# Patient Record
Sex: Female | Born: 1980 | Race: Black or African American | Hispanic: No | Marital: Single | Smoking: Never smoker
Health system: Southern US, Community
[De-identification: ages and names within clinical notes are randomized; demographics above are authoritative.]

## PROBLEM LIST (undated history)

## (undated) DIAGNOSIS — Z9109 Other allergy status, other than to drugs and biological substances: Secondary | ICD-10-CM

## (undated) DIAGNOSIS — R87810 Cervical high risk human papillomavirus (HPV) DNA test positive: Principal | ICD-10-CM

## (undated) DIAGNOSIS — M25569 Pain in unspecified knee: Secondary | ICD-10-CM

## (undated) DIAGNOSIS — L732 Hidradenitis suppurativa: Secondary | ICD-10-CM

## (undated) DIAGNOSIS — J310 Chronic rhinitis: Secondary | ICD-10-CM

## (undated) DIAGNOSIS — I10 Essential (primary) hypertension: Secondary | ICD-10-CM

## (undated) DIAGNOSIS — R29898 Other symptoms and signs involving the musculoskeletal system: Secondary | ICD-10-CM

## (undated) DIAGNOSIS — K589 Irritable bowel syndrome without diarrhea: Secondary | ICD-10-CM

## (undated) DIAGNOSIS — N83209 Unspecified ovarian cyst, unspecified side: Principal | ICD-10-CM

## (undated) DIAGNOSIS — G43009 Migraine without aura, not intractable, without status migrainosus: Secondary | ICD-10-CM

## (undated) DIAGNOSIS — G8929 Other chronic pain: Secondary | ICD-10-CM

## (undated) HISTORY — DX: Migraine without aura, not intractable, without status migrainosus: G43.009

## (undated) HISTORY — DX: Irritable bowel syndrome, unspecified: K58.9

## (undated) HISTORY — DX: Unspecified ovarian cyst, unspecified side: N83.209

## (undated) HISTORY — PX: OTHER SURGICAL HISTORY: SHX169

## (undated) HISTORY — DX: Cervical high risk human papillomavirus (HPV) DNA test positive: R87.810

## (undated) HISTORY — DX: Hidradenitis suppurativa: L73.2

## (undated) HISTORY — PX: LAPAROSCOPIC GASTRIC SLEEVE RESECTION: SHX5895

## (undated) HISTORY — PX: BREAST REDUCTION SURGERY: SHX8

## (undated) HISTORY — DX: Chronic rhinitis: J31.0

## (undated) HISTORY — PX: ABDOMINAL HYSTERECTOMY: SHX81

---

## 2001-06-15 ENCOUNTER — Emergency Department (HOSPITAL_COMMUNITY): Admission: EM | Admit: 2001-06-15 | Discharge: 2001-06-15 | Payer: Self-pay | Admitting: Internal Medicine

## 2001-11-05 ENCOUNTER — Emergency Department (HOSPITAL_COMMUNITY): Admission: EM | Admit: 2001-11-05 | Discharge: 2001-11-05 | Payer: Self-pay | Admitting: Emergency Medicine

## 2003-08-09 ENCOUNTER — Ambulatory Visit (HOSPITAL_COMMUNITY): Admission: RE | Admit: 2003-08-09 | Discharge: 2003-08-09 | Payer: Self-pay | Admitting: Family Medicine

## 2005-03-12 ENCOUNTER — Ambulatory Visit (HOSPITAL_COMMUNITY): Admission: RE | Admit: 2005-03-12 | Discharge: 2005-03-12 | Payer: Self-pay | Admitting: Family Medicine

## 2005-11-11 ENCOUNTER — Emergency Department (HOSPITAL_COMMUNITY): Admission: EM | Admit: 2005-11-11 | Discharge: 2005-11-11 | Payer: Self-pay | Admitting: Emergency Medicine

## 2006-07-02 ENCOUNTER — Ambulatory Visit (HOSPITAL_COMMUNITY): Admission: RE | Admit: 2006-07-02 | Discharge: 2006-07-02 | Payer: Self-pay | Admitting: Otolaryngology

## 2006-07-19 ENCOUNTER — Ambulatory Visit (HOSPITAL_COMMUNITY): Admission: RE | Admit: 2006-07-19 | Discharge: 2006-07-19 | Payer: Self-pay | Admitting: Otolaryngology

## 2007-06-16 DIAGNOSIS — G43009 Migraine without aura, not intractable, without status migrainosus: Secondary | ICD-10-CM

## 2007-06-16 HISTORY — DX: Migraine without aura, not intractable, without status migrainosus: G43.009

## 2007-08-09 ENCOUNTER — Ambulatory Visit: Payer: Self-pay | Admitting: Cardiology

## 2007-08-26 ENCOUNTER — Ambulatory Visit: Payer: Self-pay

## 2007-09-20 ENCOUNTER — Ambulatory Visit (HOSPITAL_COMMUNITY): Admission: RE | Admit: 2007-09-20 | Discharge: 2007-09-20 | Payer: Self-pay | Admitting: Family Medicine

## 2007-11-14 ENCOUNTER — Encounter (INDEPENDENT_AMBULATORY_CARE_PROVIDER_SITE_OTHER): Payer: Self-pay | Admitting: Specialist

## 2007-11-14 ENCOUNTER — Ambulatory Visit (HOSPITAL_BASED_OUTPATIENT_CLINIC_OR_DEPARTMENT_OTHER): Admission: RE | Admit: 2007-11-14 | Discharge: 2007-11-15 | Payer: Self-pay | Admitting: Specialist

## 2008-06-07 ENCOUNTER — Emergency Department (HOSPITAL_COMMUNITY): Admission: EM | Admit: 2008-06-07 | Discharge: 2008-06-07 | Payer: Self-pay | Admitting: Emergency Medicine

## 2009-07-22 ENCOUNTER — Ambulatory Visit
Admission: RE | Admit: 2009-07-22 | Discharge: 2009-07-22 | Payer: Self-pay | Source: Home / Self Care | Admitting: Family Medicine

## 2010-07-06 ENCOUNTER — Encounter: Payer: Self-pay | Admitting: Otolaryngology

## 2010-07-07 ENCOUNTER — Ambulatory Visit (HOSPITAL_COMMUNITY)
Admission: RE | Admit: 2010-07-07 | Discharge: 2010-07-07 | Payer: Self-pay | Source: Home / Self Care | Attending: Family Medicine | Admitting: Family Medicine

## 2010-08-20 ENCOUNTER — Ambulatory Visit (INDEPENDENT_AMBULATORY_CARE_PROVIDER_SITE_OTHER): Payer: Self-pay | Admitting: Gastroenterology

## 2010-08-20 ENCOUNTER — Encounter: Payer: Self-pay | Admitting: Gastroenterology

## 2010-08-20 DIAGNOSIS — K625 Hemorrhage of anus and rectum: Secondary | ICD-10-CM | POA: Insufficient documentation

## 2010-08-20 DIAGNOSIS — R1032 Left lower quadrant pain: Secondary | ICD-10-CM | POA: Insufficient documentation

## 2010-08-21 ENCOUNTER — Encounter: Payer: Self-pay | Admitting: Internal Medicine

## 2010-08-22 ENCOUNTER — Encounter: Payer: Self-pay | Admitting: Gastroenterology

## 2010-08-26 ENCOUNTER — Encounter: Payer: BC Managed Care – PPO | Admitting: Internal Medicine

## 2010-08-26 ENCOUNTER — Ambulatory Visit (HOSPITAL_COMMUNITY)
Admission: RE | Admit: 2010-08-26 | Discharge: 2010-08-26 | Disposition: A | Discharge status: Home / Self Care | Payer: BC Managed Care – PPO | Source: Ambulatory Visit | Attending: Internal Medicine | Admitting: Internal Medicine

## 2010-08-26 DIAGNOSIS — R1032 Left lower quadrant pain: Secondary | ICD-10-CM

## 2010-08-26 DIAGNOSIS — R635 Abnormal weight gain: Secondary | ICD-10-CM | POA: Insufficient documentation

## 2010-08-26 DIAGNOSIS — K625 Hemorrhage of anus and rectum: Secondary | ICD-10-CM

## 2010-08-26 DIAGNOSIS — I10 Essential (primary) hypertension: Secondary | ICD-10-CM | POA: Insufficient documentation

## 2010-08-26 DIAGNOSIS — Z79899 Other long term (current) drug therapy: Secondary | ICD-10-CM | POA: Insufficient documentation

## 2010-08-26 NOTE — Letter (Signed)
Summary: TCS ORDER  TCS ORDER   Imported By: Ave Filter 08/21/2010 08:50:58  _____________________________________________________________________  External Attachment:    Type:   Image     Comment:   External Document

## 2010-08-26 NOTE — Assessment & Plan Note (Signed)
Summary: LLQ pain   Vital Signs:  Patient profile:   30 year old female Height:      60 inches Weight:      213.50 pounds BMI:     41.85 Temp:     97.7 degrees F oral Pulse rate:   100 / minute BP sitting:   124 / 74  (left arm)  Vitals Entered By: Carolan Clines LPN (August 19, 2128 2:29 PM)  Visit Type:  Initial Consult Referring Provider:  Dr. Gerda Diss Primary Care Provider:  Dr. Gerda Diss   History of Present Illness: Jasmin Chen is a pleasant 30 year old African American female who presents today with a 2-3 month hx of LLQ pain. constant. pain characteristics range from sharp to stabbing to throbbing. Nothing alleviates. CT on 07/07/10 showed mass in uterine fundus measuring 6.3X6.4X5.8. Has seen Eber Jones, NP who works with [employer]. Has hx of hemorrhoids; states had episode of large amount of brbpr gushing while sitting on couch. BMs are every other day. Has hx of IBS. States does not feel better after BM.  No loss of appetite, +wt gain of about 15 lbs in past year. Takes ibuprofen, BC, excedrin a few times/week.   Jan 2012: H/H 12.5/36.1, WBC 7.6  Current Medications (verified): 1)  Ocella 3-0.03 Mg Tabs (Drospirenone-Ethinyl Estradiol) .... Take One Once Daily 2)  Nasonex 50 Mcg/act Susp (Mometasone Furoate) .... Use One Squirt in Each Nostrile 3)  Albuterol Sulfate (2.5 Mg/73ml) 0.083% Nebu (Albuterol Sulfate) .... Use One Q4hrs Prn 4)  Proventil Hfa 108 (90 Base) Mcg/act Aers (Albuterol Sulfate) .... Take Two Puffs Q4hrs As Needed 5)  Lisinopril-Hydrochlorothiazide 20-12.5 Mg Tabs (Lisinopril-Hydrochlorothiazide) .... Take One Once Daily 6)  Hydrocodone-Acetaminophen 5-325 Mg Tabs (Hydrocodone-Acetaminophen) .... Take One Qid As Needed  Allergies (verified): No Known Drug Allergies  Past History:  Past Medical History: HTN Allergies Asthma  Past Surgical History: deviated septum repair 2 cysts removed from arm breast reduction   Family History: Mother:DM, heart  disease, hypercholesterolemia, MI Father:heart disease, deceased  +FH colon ca: paternal grandmother, deceased  Social History: Occupation: [employer] Patient has never smoked.  Alcohol Use - yes, occasional Single, no children, lives alone  Smoking Status:  never  Review of Systems General:  Denies fever, chills, and anorexia. Eyes:  Denies blurring, irritation, and discharge. ENT:  Denies sore throat, hoarseness, and difficulty swallowing. CV:  Denies chest pains and syncope. Resp:  Denies dyspnea at rest and wheezing. GI:  See HPI. GU:  Denies urinary burning and urinary frequency. MS:  Denies joint pain / LOM, joint swelling, and joint stiffness. Derm:  Denies rash, itching, and dry skin. Neuro:  Denies weakness and syncope. Psych:  Denies depression and anxiety.  Physical Exam  General:  Well developed, well nourished, no acute distress. Head:  Normocephalic and atraumatic. Eyes:  PERRLA, no icterus. Mouth:  No deformity or lesions, dentition normal. Lungs:  Clear throughout to auscultation. Heart:  Regular rate and rhythm; no murmurs, rubs,  or bruits. Abdomen:  +BS, soft, obese, TTP LLQ, no rebound or guarding no HSM.  Msk:  Symmetrical with no gross deformities. Normal posture. Pulses:  Normal pulses noted. Extremities:  No clubbing, cyanosis, edema or deformities noted. Neurologic:  Alert and  oriented x4;  grossly normal neurologically. Skin:  Intact without significant lesions or rashes. Psych:  Alert and cooperative. Normal mood and affect.   Impression & Recommendations:  Problem # 1:  ABDOMINAL PAIN-LLQ (ICD-67.86) 30 year old [place] American, pleasant female with  2-3 mos hx of increasing LLQ pain, constant, sharp/stabbing/throbbing. No alleviating factors. No resolution with BMs, which are every other day. Reported hx of IBS. Did have CT Jan 2012 with showed a 6.3X6.4X5.8 uterine mass, but GYN is doubtful this is contributing to her  discomfort. No relief with BMs. +brbpr, large amounts, several incidences. Hematochezia may be related to benign anorectal source; question of LLQ component of IBS. Will evaluate with colonoscopy to assess further.   TCS with Dr. Jena Gauss in near future: the R/B/A have been discussed in detail; states understanding and has no further questions.  Problem # 2:  RECTAL BLEEDING (ICD-569.3) See # 1.

## 2010-08-26 NOTE — Letter (Signed)
Summary: REFERRAL FROM DR. SCOTT LUKING  REFERRAL FROM DR. SCOTT LUKING   Imported By: Rexene Alberts 08/22/2010 11:26:05  _____________________________________________________________________  External Attachment:    Type:   Image     Comment:   External Document

## 2010-08-31 NOTE — Op Note (Signed)
  NAMEAVIGAYIL, Jasmin Chen               ACCOUNT NO.:  [id number]  MEDICAL RECORD NO.:  [id number]           PATIENT TYPE:  O  LOCATION:  DAYP                          FACILITY:  [facility]  PHYSICIAN:  R. Roetta Sessions, M.D. DATE OF BIRTH:  [birth year]  DATE OF PROCEDURE:  08/26/2010 DATE OF DISCHARGE:                              OPERATIVE REPORT   INDICATIONS FOR PROCEDURE:  A 30 year old lady with 45-month history of intermittent left lower quadrant abdominal pain, bowel movement every other day, recent weight gain, takes ibuprofen and BC powders regularly, has a uterine mass on CT for which she is in process of getting on GYN evaluation.  Intermittent painless rectal bleeding.  Ileal colonoscopy is now being done to further evaluate her symptoms.  Risks, benefits, limitations, alternatives, and imponderables have been discussed, questions answered.  Please see the documentation in the medical record.  PROCEDURE NOTE:  O2 saturation, blood pressure, pulse, respirations were monitored throughout the entirety of the procedure.  CONSCIOUS SEDATION:  Versed 5 mg IV, Demerol 125 mg IV in divided doses.  INSTRUMENT:  Pentax video chip system.  FINDINGS:  Digital rectal exam revealed no abnormalities.  Endoscopic findings:  Prep was good.  Colon:  Colonic mucosa was surveyed from the rectosigmoid junction through the left transverse right colon to the appendiceal orifice, ileocecal valve/cecum.  These structures were well seen and photographed for the record.  Terminal ileum was intubated to 5 cm.  From this level, the scope was slowly and cautiously withdrawn. All previously mentioned mucosal surfaces were again seen.  The colonic mucosa appeared entirely normal as did terminal ileum mucosa.  Scope was pulled down in the rectum where thorough examination of the rectal mucosa including retroflexed view of the anal verge, on fos view of the anal canal demonstrated no abnormalities.  The patient  tolerated the procedure well.  Cecal withdrawal time 8 minutes.  IMPRESSION:  Normal rectum, colon, terminal ileum.  I suspect the patient has an element of irritable bowel syndrome, remains to be seen if the mass in the uterus is contributing to her symptoms in anyway.  RECOMMENDATIONS: 1. Minimize use of nonsteroidal agents.  Add Align probiotic 1 capsule     daily to her regimen.  A 10-day course of     Anusol suppositories 1 per rectum at bedtime. 2. In addition, we will add Benefiber 1 tablespoon daily to her     regimen. 3. Follow up appointment with [place] to reassess her symptoms in 1 month.     Jonathon Bellows, M.D.     RMR/MEDQ  D:  08/26/2010  T:  08/26/2010  Job:  [id number]  cc:   Dr. Gerda Diss  [facility]  Electronically Signed by Lorrin Goodell M.D. on 08/31/2010 09:12:37 AM

## 2010-09-05 ENCOUNTER — Encounter: Payer: Self-pay | Admitting: Urgent Care

## 2010-09-21 ENCOUNTER — Emergency Department (HOSPITAL_COMMUNITY)
Admission: EM | Admit: 2010-09-21 | Discharge: 2010-09-21 | Disposition: A | Discharge status: Home / Self Care | Payer: BC Managed Care – PPO | Attending: Emergency Medicine | Admitting: Emergency Medicine

## 2010-09-21 DIAGNOSIS — T781XXA Other adverse food reactions, not elsewhere classified, initial encounter: Secondary | ICD-10-CM | POA: Insufficient documentation

## 2010-09-21 DIAGNOSIS — R220 Localized swelling, mass and lump, head: Secondary | ICD-10-CM | POA: Insufficient documentation

## 2010-09-21 DIAGNOSIS — J45909 Unspecified asthma, uncomplicated: Secondary | ICD-10-CM | POA: Insufficient documentation

## 2010-09-21 DIAGNOSIS — Z91018 Allergy to other foods: Secondary | ICD-10-CM | POA: Insufficient documentation

## 2010-09-29 ENCOUNTER — Ambulatory Visit: Payer: BC Managed Care – PPO | Admitting: Urgent Care

## 2010-10-28 NOTE — Op Note (Signed)
[name], Jasmin Chen               ACCOUNT NO.:  [id number]   MEDICAL RECORD NO.:  [id number]          PATIENT TYPE:  AMB   LOCATION:  DSC                          FACILITY:  [facility]   PHYSICIAN:  Earvin Hansen L. Truesdale, M.D.DATE OF BIRTH:  12/04/1980   DATE OF PROCEDURE:  11/14/2007  DATE OF DISCHARGE:                               OPERATIVE REPORT   INDICATIONS:  This is a 30 year old lady with severe macromastia, back  and shoulder pain, secondary to large pendulous breast with heterogenous  changes, and increased accessory breast tissue laterally.   PROCEDURES PLAN:  Bilateral breast reductions and removal of accessory  breast tissue using the inferior pedicle procedure.   DESCRIPTION OF PROCEDURE:  Preoperatively, the patient was set up and  drawn for the reduction mammoplasty, inferior pedicle reduction.  She  was re-marked back to 21 cm from the suprasternal notch.  She then  underwent general anesthesia, intubated orally.  Prep was done to the  chest, breast areas in a routine fashion using Hibiclens soap and  solution, walled off with sterile towels and drapes so as to make a  sterile field.  One-quater percent Xylocaine with epinephrine was  injected, a total of 150 mL per side.  This was allowed to set up.  The  wounds were scored with #15 blade.  The skin of the inferior pedicle of  the epithelial layers with #20 blades, medial and lateral pachydermal  pedicles were excised down to the underlying fascia.  Laterally,  accessory breast tissue was removed in large quantities.  Next, the new  keyhole area was debulked, and then the flaps were transposed and stayed  with 3-0 Prolene suture, after proper hemostasis.  Subcutaneous closure  was done with 3-0 Monocryl x2 layers, then run the subcuticular stitch  with 3-0 Monocryl and 5-0 Monocryl throughout the inverted T.  The skin  was in excellent symmetry.  The wounds were drained with #10 fully  fluted Blake drains, which were  placed in the depths of the wound and  brought out through the lateral portion of the incision and secured with  3-0 Prolene.  At end of the procedure, the nipple-areolar complexes were  examined showing good suppleness and blood supply.  The wounds were  cleansed.  Steri-Strips and sterile dressing were applied including the  Xeroform, 4 x 4s, ABDs, and Accufix tape.  She was then taken to  recovery room in excellent condition.      Yaakov Guthrie. Shon Hough, M.D.  Electronically Signed     GLT/MEDQ  D:  11/14/2007  T:  11/15/2007  Job:  [id number]

## 2010-10-28 NOTE — Assessment & Plan Note (Signed)
[facility]                            CARDIOLOGY OFFICE NOTE   Jasmin Chen, BORGWARDT                      MRN:          [id number]  DATE:08/09/2007                            DOB:          31-May-1981    I was asked to consult by Dr. Lilyan Punt on [name],  a 30-year-  old single black female for chest pain.   [name] is the daughter of a patient mine, [name], who had a heart  attack at age 99.  At that time, [name] was overweight and had type 2  diabetes.  She has had a remarkable turnaround and she really wants the  best for children.   Over the last several months, [name] has been having some substernal  chest pressure described as a fist turning in her chest.  She really  does not have any dysphasia.  Occasionally she will have some heartburn  but this seems different.  Sometimes it is associated with tingling in  her arms.   She does have some dyspnea on exertion as well.   She denies orthopnea, PND, peripheral edema.   Her risk factors other than her family history is obesity and sedentary  lifestyle.  She does not carry a diagnosis of hypertension or diabetes.  She has no history of hyperlipidemia, though she has not had this  checked in a while.   PAST MEDICAL HISTORY:  She has no known drug allergies.  She is  currently on Yaz birth control, Meridia 10 mg a day and Midrin for  migraines and albuterol p.r.n. She does not smoke, drink or use any  recreational products.   She had no previous surgeries.   FAMILY HISTORY:  As above.   Also note that her father had a heart attack at 6 and died.   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  She has no children.   Her review of systems is negative other than a history of some  constipation, fatigue, irritable bowel and reflux.   PHYSICAL EXAMINATION:  Blood pressure 116/86, her pulse is 78 and  regular.  Weight is 192.  She is 5 feet tall.   EKG today shows  normal sinus rhythm with no ST-segment changes except  some slight early repolarization.   HEENT: Normocephalic, atraumatic. PERRLA. Extra-ocular movements intact.  Sclerae are clear. Facial symmetry is normal. Carotid upstrokes are  equal bilaterally without bruits. There is no JVD.  Thyroid is not  enlarged. Trachea is midline.  LUNGS:  Are clear to auscultation.  There is no chest wall tenderness.  HEART: PMI is difficult to appreciate. She has normal S1, S2 without  gallop, rub, or murmur.  ABDOMEN: Soft with good bowel sounds. There is no midline bruit. There  is no hepatomegaly.  EXTREMITIES: Reveals no cyanosis, clubbing or edema. Pulses are intact.  NEURO: Intact.   ASSESSMENT/PLAN:  Jasmin Chen has very premature history of coronary  disease.  She is significantly overweight and is high risk for  developing metabolic syndrome or blatant diabetes in the near future.  I  have had a  long discussion with her about this today.  She is also very  sedentary.  She does not know her lipid status.   PLAN:  1. Exercise rest/stress Myoview.  2. Fasting lipids and a comprehensive metabolic panel.  3. Walking 3 hours a week or some sort exercise 3 hours a week.  4. A 40-50 pounds of weight loss and keep it off.     Thomas C. Daleen Squibb, MD, [facility]  Electronically Signed    TCW/MedQ  DD: 08/09/2007  DT: 08/09/2007  Job #: [id number]   cc:   Lorin Picket A. Gerda Diss, MD

## 2010-11-21 ENCOUNTER — Emergency Department (HOSPITAL_COMMUNITY)
Admission: EM | Admit: 2010-11-21 | Discharge: 2010-11-21 | Disposition: A | Discharge status: Home / Self Care | Payer: BC Managed Care – PPO | Attending: Emergency Medicine | Admitting: Emergency Medicine

## 2010-11-21 DIAGNOSIS — N938 Other specified abnormal uterine and vaginal bleeding: Secondary | ICD-10-CM | POA: Insufficient documentation

## 2010-11-21 DIAGNOSIS — I10 Essential (primary) hypertension: Secondary | ICD-10-CM | POA: Insufficient documentation

## 2010-11-21 DIAGNOSIS — Z79899 Other long term (current) drug therapy: Secondary | ICD-10-CM | POA: Insufficient documentation

## 2010-11-21 DIAGNOSIS — N949 Unspecified condition associated with female genital organs and menstrual cycle: Secondary | ICD-10-CM | POA: Insufficient documentation

## 2010-11-21 DIAGNOSIS — R10819 Abdominal tenderness, unspecified site: Secondary | ICD-10-CM | POA: Insufficient documentation

## 2010-11-21 DIAGNOSIS — R109 Unspecified abdominal pain: Secondary | ICD-10-CM | POA: Insufficient documentation

## 2010-11-21 DIAGNOSIS — D259 Leiomyoma of uterus, unspecified: Secondary | ICD-10-CM | POA: Insufficient documentation

## 2010-11-21 DIAGNOSIS — J45909 Unspecified asthma, uncomplicated: Secondary | ICD-10-CM | POA: Insufficient documentation

## 2010-11-21 LAB — DIFFERENTIAL
Basophils Absolute: 0.1 10*3/uL (ref 0.0–0.1)
Basophils Relative: 1 % (ref 0–1)
Eosinophils Absolute: 0.4 10*3/uL (ref 0.0–0.7)
Eosinophils Relative: 6 % — ABNORMAL HIGH (ref 0–5)
Lymphocytes Relative: 47 % — ABNORMAL HIGH (ref 12–46)
Lymphs Abs: 3.4 10*3/uL (ref 0.7–4.0)
Monocytes Absolute: 0.5 10*3/uL (ref 0.1–1.0)
Monocytes Relative: 7 % (ref 3–12)
Neutro Abs: 2.9 10*3/uL (ref 1.7–7.7)
Neutrophils Relative %: 40 % — ABNORMAL LOW (ref 43–77)

## 2010-11-21 LAB — CBC
HCT: 38.5 % (ref 36.0–46.0)
Hemoglobin: 13.2 g/dL (ref 12.0–15.0)
MCH: 27.5 pg (ref 26.0–34.0)
MCHC: 34.3 g/dL (ref 30.0–36.0)
MCV: 80.2 fL (ref 78.0–100.0)
Platelets: 269 10*3/uL (ref 150–400)
RBC: 4.8 MIL/uL (ref 3.87–5.11)
RDW: 15.2 % (ref 11.5–15.5)
WBC: 7.2 10*3/uL (ref 4.0–10.5)

## 2010-11-21 LAB — WET PREP, GENITAL
Trich, Wet Prep: NONE SEEN
Yeast Wet Prep HPF POC: NONE SEEN

## 2010-11-21 LAB — POCT I-STAT, CHEM 8
BUN: 9 mg/dL (ref 6–23)
Calcium, Ion: 1.09 mmol/L — ABNORMAL LOW (ref 1.12–1.32)
Chloride: 106 mEq/L (ref 96–112)
Creatinine, Ser: 0.6 mg/dL (ref 0.4–1.2)
Glucose, Bld: 88 mg/dL (ref 70–99)
HCT: 43 % (ref 36.0–46.0)
Hemoglobin: 14.6 g/dL (ref 12.0–15.0)
Potassium: 4 mEq/L (ref 3.5–5.1)
Sodium: 139 mEq/L (ref 135–145)
TCO2: 26 mmol/L (ref 0–100)

## 2010-11-21 LAB — PREGNANCY, URINE: Preg Test, Ur: NEGATIVE

## 2010-11-22 LAB — GC/CHLAMYDIA PROBE AMP, GENITAL
Chlamydia, DNA Probe: NEGATIVE
GC Probe Amp, Genital: NEGATIVE

## 2010-12-05 ENCOUNTER — Encounter (HOSPITAL_COMMUNITY)
Admission: RE | Admit: 2010-12-05 | Discharge: 2010-12-05 | Disposition: A | Discharge status: Home / Self Care | Payer: BC Managed Care – PPO | Source: Ambulatory Visit | Attending: Obstetrics and Gynecology | Admitting: Obstetrics and Gynecology

## 2010-12-05 LAB — CBC
HCT: 39.7 % (ref 36.0–46.0)
Hemoglobin: 13.4 g/dL (ref 12.0–15.0)
MCH: 27.3 pg (ref 26.0–34.0)
MCHC: 33.8 g/dL (ref 30.0–36.0)
MCV: 81 fL (ref 78.0–100.0)
Platelets: 242 10*3/uL (ref 150–400)
RBC: 4.9 MIL/uL (ref 3.87–5.11)
RDW: 15.2 % (ref 11.5–15.5)
WBC: 6.1 10*3/uL (ref 4.0–10.5)

## 2010-12-05 LAB — BASIC METABOLIC PANEL
BUN: 9 mg/dL (ref 6–23)
CO2: 24 mEq/L (ref 19–32)
Calcium: 9.8 mg/dL (ref 8.4–10.5)
Chloride: 104 mEq/L (ref 96–112)
Creatinine, Ser: 0.48 mg/dL — ABNORMAL LOW (ref 0.50–1.10)
GFR calc Af Amer: >60 mL/min (ref >60)
GFR calc non Af Amer: >60 mL/min (ref >60)
Glucose, Bld: 91 mg/dL (ref 70–99)
Potassium: 4.3 mEq/L (ref 3.5–5.1)
Sodium: 138 mEq/L (ref 135–145)

## 2010-12-05 LAB — SURGICAL PCR SCREEN
MRSA, PCR: NEGATIVE
Staphylococcus aureus: NEGATIVE

## 2010-12-05 LAB — HCG, QUANTITATIVE, PREGNANCY: hCG, Beta Chain, Quant, S: 1 m[IU]/mL (ref <5)

## 2010-12-09 ENCOUNTER — Inpatient Hospital Stay (HOSPITAL_COMMUNITY)
Admission: RE | Admit: 2010-12-09 | Discharge: 2010-12-11 | DRG: 359 | Disposition: A | Discharge status: Home / Self Care | Payer: BC Managed Care – PPO | Source: Ambulatory Visit | Attending: Obstetrics and Gynecology | Admitting: Obstetrics and Gynecology

## 2010-12-09 ENCOUNTER — Other Ambulatory Visit: Payer: Self-pay | Admitting: Obstetrics and Gynecology

## 2010-12-09 DIAGNOSIS — N920 Excessive and frequent menstruation with regular cycle: Secondary | ICD-10-CM | POA: Diagnosis present

## 2010-12-09 DIAGNOSIS — Z01812 Encounter for preprocedural laboratory examination: Secondary | ICD-10-CM

## 2010-12-09 DIAGNOSIS — J45909 Unspecified asthma, uncomplicated: Secondary | ICD-10-CM | POA: Diagnosis present

## 2010-12-09 DIAGNOSIS — D259 Leiomyoma of uterus, unspecified: Principal | ICD-10-CM | POA: Diagnosis present

## 2010-12-10 LAB — DIFFERENTIAL
Basophils Absolute: 0 10*3/uL (ref 0.0–0.1)
Basophils Relative: 0 % (ref 0–1)
Eosinophils Absolute: 0 10*3/uL (ref 0.0–0.7)
Eosinophils Relative: 0 % (ref 0–5)
Lymphocytes Relative: 20 % (ref 12–46)
Lymphs Abs: 1.9 10*3/uL (ref 0.7–4.0)
Monocytes Absolute: 0.7 10*3/uL (ref 0.1–1.0)
Monocytes Relative: 7 % (ref 3–12)
Neutro Abs: 6.9 10*3/uL (ref 1.7–7.7)
Neutrophils Relative %: 73 % (ref 43–77)

## 2010-12-10 LAB — CBC
HCT: 34.9 % — ABNORMAL LOW (ref 36.0–46.0)
Hemoglobin: 11.7 g/dL — ABNORMAL LOW (ref 12.0–15.0)
MCH: 27.1 pg (ref 26.0–34.0)
MCHC: 33.5 g/dL (ref 30.0–36.0)
MCV: 81 fL (ref 78.0–100.0)
Platelets: 246 10*3/uL (ref 150–400)
RBC: 4.31 MIL/uL (ref 3.87–5.11)
RDW: 14.9 % (ref 11.5–15.5)
WBC: 9.5 10*3/uL (ref 4.0–10.5)

## 2010-12-14 NOTE — Op Note (Signed)
Jasmin Chen, Jasmin Chen NO.:  [id number]  MEDICAL RECORD NO.:  [id number]  LOCATION:  A321                          FACILITY:  [facility]  PHYSICIAN:  Tilda Burrow, M.D. DATE OF BIRTH:  12-29-1980  DATE OF PROCEDURE:  12/09/2010 DATE OF DISCHARGE:                              OPERATIVE REPORT   PREOPERATIVE DIAGNOSES: 1. Uterine fibroids to 14 weeks' size. 2. Menorrhagia.  POSTOPERATIVE DIAGNOSES: 1. Uterine fibroids to 14 weeks' size. 2. Menorrhagia.  PROCEDURE:  Supracervical hysterectomy.  SURGEON:  Tilda Burrow, MD  ASSISTANT:  Darel Hong _____, CRNA.  ANESTHESIA:  General, Glynn Octave, CRNA.  COMPLICATIONS:  None.  FINDINGS:  A uniform globular uterus filling pelvis and palpable above the symphysis pubis prior to initiation of case.  DETAILS OF PROCEDURE:  The patient was taken to the operating room, prepped and draped for lower abdominal surgery.  The patient had signed consents, had a preoperative bowel prep.  Flow tones were in place. Time-out was conducted and procedure confirmed by involved parties including documentation of IV Ancef preoperatively.  The lower abdominal and transverse incision was made in standard method of Pfannenstiel, excising a 2-cm wide ellipse of skin and underlying fatty tissue to help with lower abdominal incision contouring, then standard Pfannenstiel technique used to open the abdomen.  The abdominal wall thickness and strong muscularity made procedure technically challenging.  Bowel was packed away using moistened laps and radiographic laparotomy tape and Balfour retractor positioned.  Round ligaments were taken down bilaterally and utero-ovarian ligament and fallopian tube doubly clamped, cut, and suture ligated using Kelly clamps, Mayo scissors, and 0 chromic suture ligature.  Bladder flap was developed anteriorly.  The uterine vessels were then clamped with a curved Heaney clamp, back clamped with a Kelly, cut  and suture ligated bilaterally.  Bladder flap was then developed anteriorly.  There was a diffuse tendency to ooze throughout the case.  The upper cardinal ligaments were clamped, cut, and suture ligated using a straight Heaney clamp, Mayo scissors, and 0 chromic suture ligature.  At this time, a malleable blade was placed beneath the uterus and the uterine fundus amputated off the lower uterine segment for improved visibility.  The remainder of the cardinal ligaments on either side were then clamped, cut, and suture ligated.  At this time, we were at a position where we could crossclamp the last of the cardinal ligaments and then amputated off the lower uterine segment of the cervix.  The remaining cervical stump was then oversewed using 0 chromic x3 sutures.  Hemostasis was quite good and pedicles were inspected as the abdomen was irrigated.  Pedicles were once again inspected.  Laparotomy equipment removed, laparotomy tapes removed, and then anterior peritoneum closed using running 2-0 Vicryl. The fascia was closed transversely using running 0 Vicryl and subcu tissues were irrigated, reapproximated using 2 layers of interrupted 2-0 plain sutures and subcuticular 4-0 Vicryl on a Keith needle used to close the skin edges.  Sponge and needle counts were correct.  ESTIMATED BLOOD LOSS:  150 mL.     Tilda Burrow, M.D.     JVF/MEDQ  D:  12/09/2010  T:  12/10/2010  Job:  [id number]  Electronically Signed by Christin Bach M.D. on 12/14/2010 10:27:19 PM

## 2010-12-25 NOTE — Discharge Summary (Addendum)
NAMEANSHU, WEHNER NO.:  [id number]  MEDICAL RECORD NO.:  [id number]  LOCATION:  A321                          FACILITY:  [facility]  PHYSICIAN:  Tilda Burrow, M.D. DATE OF BIRTH:  [birth year]  DATE OF ADMISSION:  12/09/2010 DATE OF DISCHARGE:  06/28/2012LH                              DISCHARGE SUMMARY   ADMITTING DIAGNOSES:  Uterine fibroids 12-14 weeks' size and menorrhagia.  DISCHARGE DIAGNOSES:  Uterine fibroids 12-14 weeks' size.  DISCHARGE MEDICATIONS:  Omeprazole 20 mg p.o. daily, Nasonex spray twice daily as needed, albuterol inhaler 2 puffs twice daily as needed, Zyrtec 10 mg one p.o. at bedtime p.r.n. congestion, hydrocodone 5/325 one to two q.4 h. p.r.n. pain, Toradol 10 mg 20 tablets one p.o. q.6 h. x5 days.  Follow up in 1 week at [facility] for incision check.  This generally healthy 30 year old female was admitted for cervical hysterectomy.  Procedure was uncomplicated with minimal blood loss. Postop day #1, stable with hemoglobin 11, hematocrit 34, white count 9500 with no left shift.  Second day, she was similarly afebrile and was stable for discharge home.  On postop day #2, tolerating regular diet with no staples in place, subcutaneous skin closure for followup in 1 week [facility].  Routine per surgical instructions given by nursing.     Tilda Burrow, M.D.     JVF/MEDQ  D:  12/24/2010  T:  12/25/2010  Job:  [id number]  cc:   [facility]

## 2010-12-25 NOTE — H&P (Signed)
NAMEMarland Chen  [name], [name] NO.:  [id number]  MEDICAL RECORD NO.:  [id number]  LOCATION:                                 FACILITY:  PHYSICIAN:  Tilda Burrow, M.D. DATE OF BIRTH:  [birth year]  DATE OF ADMISSION: DATE OF DISCHARGE:  LH                             HISTORY & PHYSICAL   CHIEF COMPLAINT/ADMITTING DIAGNOSIS:  Bleeding and enlarged clots due to tumor in uterus, uterine fibroids 12-14 weeks' size.  HISTORY OF PRESENT ILLNESS:  This is a 30 year old female with known uterine fibroids, identified on CT scan, is admitted for supracervical hysterectomy.  She has had heavy flooding periods interfering with work, requiring her to leave work.  She has had a CT, which shows a 6.3 x 6.4 x 5.8 cm fundal fibroid.  She is a 30 year old, gravida 0, para 0 with no desire for children, and is calm, and her desire to proceed with definitive surgery.  PAST MEDICAL HISTORY:  Benign.  SURGICAL HISTORY:  Benign, except for asthma during the winters.  She uses an inhaler p.r.n. in winter season only.  Surgical history of breast reduction bilaterally, right axillary cyst, right nasal polypectomy.  SOCIAL HISTORY:  She denies habits including cigarettes, alcohol, or recreational drugs.  Hemoglobin 12.8.  Urinalysis, trace leukocytes, and blood.  PHYSICAL EXAMINATION:  VITAL SIGNS:  Height 5 feet 0 inch, weight 212, BP 118/74. HEENT: Within normal limits. ABDOMEN:  Thick abdominal wall without masses or hernias. EXTERNAL GENITALIA:  Nulligravida female with tiny cervix.  Uterus well supported.  She is not a vaginal surgery candidate.  Uterus is 12 weeks' size, estimated 300 grams size, with no adnexal masses, and evaluation including CT scan.  PLAN:  To proceed with supracervical hysterectomy through a lower abdominal incision.  Risk of procedure including bleeding, infection, injury to adjacent organs, requiring transfusion, and additional surgeries  discussed.     Tilda Burrow, M.D.     JVF/MEDQ  D:  12/24/2010  T:  12/25/2010  Job:  [id number]  cc:   [facility]

## 2011-03-12 LAB — POCT HEMOGLOBIN-HEMACUE: Hemoglobin: 13.7

## 2011-08-05 ENCOUNTER — Encounter (HOSPITAL_COMMUNITY): Admission: RE | Admit: 2011-08-05 | Payer: BC Managed Care – PPO | Source: Ambulatory Visit

## 2011-08-05 NOTE — H&P (Signed)
  NTS SOAP Note  Vital Signs:  Vitals as of: 06/23/2011: Systolic 110: Diastolic 84: Heart Rate 96: Temp 97.1F: Height 39ft 0in: Weight 208Lbs 5 Ounces: Pain Level 0: BMI 41  BMI : 40.68 kg/m2  Subjective: This 31 Years 21 Months old Female presents for of hemorrhoids.  Pain.  Occassional bleeding.  No fever or chills.  Has been on proctofoam.  Flares up every 2-3 months.  "Just tired of them".  Feels protrusion when inflamed.  Review of Symptoms:  Constitutional:unremarkable Head:unremarkable Eyes:unremarkable Nose/Mouth/Throat:unremarkable Cardiovascular:unremarkable Respiratory:unremarkable as per HPI Genitourinary:unremarkable Musculoskeletal:unremarkable Skin:unremarkable Breast:unremarkable Hematolgic/Lymphatic:unremarkable Allergic/Immunologic:unremarkable   Past Medical History:Obtained   Past Medical History  Pregnancy Gravida: 0 Pregnancy Para: 0 Surgical History: Hysterectomy, Phinoplasty, cyst exc (axillary), breast reduction Medical Problems: Asthma, IBS, Chronic rhinitus, HPV, Migraine Psychiatric History: none Allergies: NKDA Medications: none   Social History:Obtained   Social History  Preferred Language: English ([place]) Race:  Black or African American Ethnicity: Not Hispanic / Latino Age: 31 Years 8 Months Marital Status:  S Alcohol: no Recreational drug(s): no   Smoking Status: Never smoker reviewed on 06/23/2011  Family History:Obtained   Family History  Is there a family history of:CAD, DM, HTN    Objective Information: General:Well appearing, well nourished in no distress.  Obese. Skin:no rash or prominent lesions Head:Atraumatic; no masses; no abnormalities Eyes:conjunctiva clear, EOM intact, PERRL Mouth:Mucous membranes moist, no mucosal lesions. Neck:Supple without lymphadenopathy.  Heart:RRR, no murmur Lungs:CTA bilaterally, no wheezes, rhonchi,  rales.  Breathing unlabored. Abdomen:Soft, NT/ND, no HSM, no masses.   Prominent but reducible hemorrhoidal tissue a 4-5 o'clock.  Non-tender.  No bleeding.  No masses.  Assessment:  Diagnosis &amp; Procedure: DiagnosisCode: 455.3, ProcedureCode: 62952,    Plan: Hemorrhoids.  Medically refractory, did discuss with the patient/mother findings.  Continue local treatment.  If patient wishes to proceed will schedule.    Patient Education:Alternative treatments to surgery were discussed with patient (and family).Risks and benefits  of procedure were fully explained to the patient (and family) who gave informed consent. Patient/family questions were addressed.

## 2011-08-10 ENCOUNTER — Ambulatory Visit (HOSPITAL_COMMUNITY): Admission: RE | Admit: 2011-08-10 | Payer: BC Managed Care – PPO | Source: Ambulatory Visit | Admitting: General Surgery

## 2011-08-10 ENCOUNTER — Encounter (HOSPITAL_COMMUNITY): Admission: RE | Payer: Self-pay | Source: Ambulatory Visit

## 2011-08-10 SURGERY — HEMORRHOIDECTOMY
Anesthesia: General

## 2012-04-05 ENCOUNTER — Encounter (HOSPITAL_COMMUNITY): Payer: Self-pay | Admitting: Emergency Medicine

## 2012-04-05 ENCOUNTER — Emergency Department (HOSPITAL_COMMUNITY): Payer: BC Managed Care – PPO

## 2012-04-05 ENCOUNTER — Observation Stay (HOSPITAL_COMMUNITY)
Admission: EM | Admit: 2012-04-05 | Discharge: 2012-04-06 | Disposition: A | Discharge status: Home / Self Care | Payer: BC Managed Care – PPO | Attending: Emergency Medicine | Admitting: Emergency Medicine

## 2012-04-05 DIAGNOSIS — J45901 Unspecified asthma with (acute) exacerbation: Principal | ICD-10-CM | POA: Insufficient documentation

## 2012-04-05 DIAGNOSIS — Z79899 Other long term (current) drug therapy: Secondary | ICD-10-CM | POA: Insufficient documentation

## 2012-04-05 HISTORY — DX: Other allergy status, other than to drugs and biological substances: Z91.09

## 2012-04-05 LAB — URINALYSIS, ROUTINE W REFLEX MICROSCOPIC
Bilirubin Urine: NEGATIVE
Glucose, UA: NEGATIVE mg/dL
Hgb urine dipstick: NEGATIVE
Ketones, ur: NEGATIVE mg/dL
Leukocytes, UA: NEGATIVE
Nitrite: NEGATIVE
Protein, ur: NEGATIVE mg/dL
Specific Gravity, Urine: 1.023 (ref 1.005–1.030)
Urobilinogen, UA: 0.2 mg/dL (ref 0.0–1.0)
pH: 8.5 — ABNORMAL HIGH (ref 5.0–8.0)

## 2012-04-05 MED ORDER — ALBUTEROL SULFATE (5 MG/ML) 0.5% IN NEBU
5.0000 mg | INHALATION_SOLUTION | Freq: Once | RESPIRATORY_TRACT | Status: AC
  Administered 2012-04-05: 5 mg via RESPIRATORY_TRACT
  Filled 2012-04-05: qty 1

## 2012-04-05 MED ORDER — IPRATROPIUM BROMIDE 0.02 % IN SOLN
0.5000 mg | Freq: Once | RESPIRATORY_TRACT | Status: AC
  Administered 2012-04-05: 0.5 mg via RESPIRATORY_TRACT

## 2012-04-05 MED ORDER — IPRATROPIUM BROMIDE 0.02 % IN SOLN
RESPIRATORY_TRACT | Status: AC
  Filled 2012-04-05: qty 2.5

## 2012-04-05 MED ORDER — ALBUTEROL SULFATE (5 MG/ML) 0.5% IN NEBU
INHALATION_SOLUTION | RESPIRATORY_TRACT | Status: AC
  Filled 2012-04-05: qty 1

## 2012-04-05 MED ORDER — RACEPINEPHRINE HCL 2.25 % IN NEBU
0.5000 mL | INHALATION_SOLUTION | Freq: Once | RESPIRATORY_TRACT | Status: AC
  Administered 2012-04-05: 0.5 mL via RESPIRATORY_TRACT

## 2012-04-05 MED ORDER — SODIUM CHLORIDE 0.9 % IV BOLUS (SEPSIS)
500.0000 mL | Freq: Once | INTRAVENOUS | Status: AC
  Administered 2012-04-05: 500 mL via INTRAVENOUS

## 2012-04-05 MED ORDER — ACETAMINOPHEN 325 MG PO TABS
650.0000 mg | ORAL_TABLET | Freq: Once | ORAL | Status: AC
  Administered 2012-04-05: 650 mg via ORAL
  Filled 2012-04-05: qty 2

## 2012-04-05 MED ORDER — PREDNISONE 20 MG PO TABS
60.0000 mg | ORAL_TABLET | Freq: Every day | ORAL | Status: DC
  Administered 2012-04-06: 60 mg via ORAL
  Filled 2012-04-05: qty 3

## 2012-04-05 MED ORDER — RACEPINEPHRINE HCL 2.25 % IN NEBU
INHALATION_SOLUTION | RESPIRATORY_TRACT | Status: AC
  Administered 2012-04-05: 0.5 mL via RESPIRATORY_TRACT
  Filled 2012-04-05: qty 0.5

## 2012-04-05 MED ORDER — ALBUTEROL SULFATE (5 MG/ML) 0.5% IN NEBU
5.0000 mg | INHALATION_SOLUTION | Freq: Once | RESPIRATORY_TRACT | Status: AC
  Administered 2012-04-05: 5 mg via RESPIRATORY_TRACT

## 2012-04-05 MED ORDER — METHYLPREDNISOLONE SODIUM SUCC 125 MG IJ SOLR
125.0000 mg | Freq: Once | INTRAMUSCULAR | Status: AC
  Administered 2012-04-05: 125 mg via INTRAVENOUS
  Filled 2012-04-05: qty 2

## 2012-04-05 NOTE — ED Notes (Signed)
Meds added to Med Rec in error - deleted.

## 2012-04-05 NOTE — ED Provider Notes (Signed)
History  Scribed for [employer]. Kareem Aul, MD, the patient was seen in room TR10C/TR10C. This chart was scribed by Candelaria Stagers. The patient's care started at 2:36 PM   CSN: [id number]  Arrival date & time 04/05/12  1347   First MD Initiated Contact with Patient 04/05/12 1426      Chief Complaint  Patient presents with  . Asthma    The history is provided by the patient. No language interpreter was used.   Jasmin Chen is a 31 y.o. female who presents to the Emergency Department complaining of exacerbation of asthma that started today.  Pt has never experienced an attack like this before.  She states that nothing seemed to bring the sx on.  She is also experiencing chest pain.  She reports that she used her inhaler with no relief.   Past Medical History  Diagnosis Date  . Asthma   . Multiple environmental allergies     Past Surgical History  Procedure Date  . Deviated septum repair   . 2 cysts removed from arm   . Breast reduction surgery   . Abdominal hysterectomy     No family history on file.  History  Substance Use Topics  . Smoking status: Never Smoker   . Smokeless tobacco: Not on file  . Alcohol Use: No    OB History    Grav Para Term Preterm Abortions TAB SAB Ect Mult Living                  Review of Systems  Respiratory: Positive for chest tightness, shortness of breath and wheezing.   Cardiovascular: Positive for chest pain.  All other systems reviewed and are negative.    Allergies  Review of patient's allergies indicates no known allergies.  Home Medications   Current Outpatient Rx  Name  Route  Sig  Dispense  Refill  . ALBUTEROL SULFATE HFA 108 (90 BASE) MCG/ACT IN AERS   Inhalation   Inhale 2 puffs into the lungs every 4 (four) hours as needed.           . ALBUTEROL SULFATE (2.5 MG/3ML) 0.083% IN NEBU   Nebulization   Take 2.5 mg by nebulization every 4 (four) hours as needed.           Marland Kitchen CETIRIZINE HCL 10 MG PO TABS    Oral   Take 10 mg by mouth daily.         Marland Kitchen LISINOPRIL-HYDROCHLOROTHIAZIDE 20-12.5 MG PO TABS   Oral   Take 1 tablet by mouth daily as needed. Depends on BP         . MOMETASONE FUROATE 50 MCG/ACT NA SUSP   Nasal   2 sprays by Nasal route daily. Use one squirt in each nostrile            BP 135/99  Pulse 101  SpO2 100%  Physical Exam  Nursing note and vitals reviewed. Constitutional: She is oriented to person, place, and time. She appears well-developed and well-nourished. No distress.  HENT:  Head: Normocephalic and atraumatic.  Eyes: EOM are normal. Pupils are equal, round, and reactive to light.  Neck: Neck supple. No tracheal deviation present.  Cardiovascular: Normal rate.   Pulmonary/Chest:       Quiet chest. Mild wheezes and prolonged expirations throughout.  Mild respiratory distress.  Mild retractions.  Unable to use complete sentences.   Abdominal: Soft. There is no tenderness.  Musculoskeletal: Normal range of motion. She exhibits no edema.  Neurological: She is alert and oriented to person, place, and time. No sensory deficit.  Skin: Skin is warm and dry.  Psychiatric: She has a normal mood and affect. Her behavior is normal.    ED Course  Procedures   DIAGNOSTIC STUDIES:   COORDINATION OF CARE:  14:41 Ordered: DG Chest Port 1 View; Urinalysis, Routine w reflex microscopic   Labs Reviewed  URINALYSIS, ROUTINE W REFLEX MICROSCOPIC - Abnormal; Notable for the following:    APPearance CLOUDY (*)     pH 8.5 (*)     All other components within normal limits  LAB REPORT - SCANNED   No results found.   1. Asthma exacerbation       MDM  Asthma exacerbation. Placed on obs protocol. I personally performed the services described in this documentation, which was scribed in my presence. The recorded information has been reviewed and considered.         Juliet Rude. Rubin Payor, MD 04/21/12 [phone]

## 2012-04-05 NOTE — ED Notes (Signed)
Patient ambulated to bathroom and returned to room. SOB improved Pulse ox 99% Room air.

## 2012-04-05 NOTE — Progress Notes (Addendum)
RT did peak flow with patient.  Patient peak flow was 200 which is 63.7% of her predicted values.  Patient has an upper airway wheeze.  Notified Felicie Morn, NP and requested to give patient a racemic epinephrine treatment.  He agreed.  Treatment given.  RT will continue to monitor.

## 2012-04-05 NOTE — ED Notes (Signed)
Pt arrived with SOB and expiratory wheezing. Pt stated that she tried her inhaler but it did not work. Pt speaking in choppy sentences and labored breathing.

## 2012-04-06 MED ORDER — ALBUTEROL (5 MG/ML) CONTINUOUS INHALATION SOLN
INHALATION_SOLUTION | RESPIRATORY_TRACT | Status: AC
  Administered 2012-04-06: 10 mg/h via RESPIRATORY_TRACT
  Filled 2012-04-06: qty 20

## 2012-04-06 MED ORDER — IPRATROPIUM BROMIDE 0.02 % IN SOLN
0.5000 mg | Freq: Once | RESPIRATORY_TRACT | Status: AC
  Administered 2012-04-06: 0.5 mg via RESPIRATORY_TRACT
  Filled 2012-04-06: qty 2.5

## 2012-04-06 MED ORDER — ACETAMINOPHEN 325 MG PO TABS
650.0000 mg | ORAL_TABLET | Freq: Once | ORAL | Status: AC
  Administered 2012-04-06: 650 mg via ORAL
  Filled 2012-04-06: qty 2

## 2012-04-06 MED ORDER — ALBUTEROL SULFATE (5 MG/ML) 0.5% IN NEBU
5.0000 mg | INHALATION_SOLUTION | Freq: Once | RESPIRATORY_TRACT | Status: AC
  Administered 2012-04-06: 5 mg via RESPIRATORY_TRACT
  Filled 2012-04-06: qty 1

## 2012-04-06 MED ORDER — PREDNISONE 20 MG PO TABS: ORAL_TABLET | ORAL | Status: AC

## 2012-04-06 MED ORDER — ALBUTEROL (5 MG/ML) CONTINUOUS INHALATION SOLN
10.0000 mg/h | INHALATION_SOLUTION | RESPIRATORY_TRACT | Status: DC
  Administered 2012-04-06: 10 mg/h via RESPIRATORY_TRACT

## 2012-04-06 NOTE — ED Notes (Signed)
Meal tray ordered 

## 2012-04-06 NOTE — ED Notes (Signed)
Continuous nebulizer treatment complete. Will continue to monitor patient closely. She is resting quietly at the time.

## 2012-04-06 NOTE — ED Notes (Signed)
Pt resting quietly at the time. Given warm blankets. No signs of distress noted. Denies pain. She is alert and oriented x4. Respirations unlabored.

## 2012-04-06 NOTE — ED Notes (Signed)
Pt audibly wheezing at the time, becomes worse with movement. PA at the bedside, respiratory therapist called for possible hour long nebulizer. Vital signs stable. Will continue to monitor.

## 2012-04-06 NOTE — ED Provider Notes (Signed)
Medical screening examination/treatment/procedure(s) were performed by non-physician practitioner and as supervising physician I was immediately available for consultation/collaboration.  Doug Sou, MD 04/06/12 [phone]

## 2012-04-06 NOTE — ED Notes (Signed)
Lunch tray ordered for pt.

## 2012-04-06 NOTE — ED Provider Notes (Signed)
Patient in CDU under asthma exacerbation protocol.  Patient states she developed wheezing today while at work in a dusty environment that did not improve after the use of her MDI albuterol.  Patient resting comfortably at present with friend at bedside.  Lungs essentially clear to auscultation with minimal wheezing.  S1/S2, RRR.  Mildly tachycardia at 108 bpm.  Abdomen soft, bowel sounds present.  Initial peak flow was 200 (63.7% of predicted).  Patient to continue on treatments overnight with reassessment in AM.  Jimmye Norman, NP 04/06/12 0040

## 2012-04-06 NOTE — ED Provider Notes (Signed)
7:35 AM BP 111/57  Pulse 113  Temp 97.6 F (36.4 C) (Oral)  Resp 16  SpO2 93% Assumed care of the patient in CDU.  Patient is here on wheeze protocol. Patient states that she had significant asthma as a child.  She does not remember if she was ever hospitalized or intubated.  She does have chronic allergies.  Her primary care physician is Dr.Luking and manages her medications.  Patient states that she had asthma flare up yesterday at work and when.  She presented was very tight and unable to speak in full sentences.  She has been using her peak flow and has not been able to get the peak flow above 200.  Patient states that she is feeling much better but still feels very tight.  She has audible wheezes.  CV: RRR, tachycardic to 106, no murmurs rubs or gallops Lungs: Patient with significant tightness and wheezing bilaterally in the lung bases.  Effort is normal.  Inspiratory and expiratory phases are shortened. Abdomen: Soft, nontender, nondistended, bowel sounds normal.  A consult with with respiratory therapy.  Going to place the patient on an hour-long NEB if she still appears tight of the O2 sats are 97% on room air at this time.  See if I can get her to open up a bit more.  Patient has had 60 mg prednisone by mouth this morning.  Going to have her ambulate after her hour-long NEB. If she appears to be more open, will DC with prednisones taper and PCP f/u.   11:51 AM BP 123/57  Pulse 125  Temp 97.6 F (36.4 C) (Oral)  Resp 18  SpO2 99% She has completed her hour-long nebulization.  There are no no audible wheezes.  Peak flow measurements are 275 twice.  Patient states that she is feeling much better however she feels shaky and her heart is racing.  Reassured the patient that this is due to her nebulizer treatment.   Will dc with prednisone taper and PCP follow up. Discussed reasons to seek immediate care. Patient expresses understanding and agrees with plan.   Arthor Captain,  PA-C 04/06/12 1156

## 2012-04-06 NOTE — ED Provider Notes (Signed)
I was available for consult during the relevant portion of this patient's CDU course.  Gerhard Munch, MD 04/06/12 [phone]

## 2012-04-06 NOTE — Progress Notes (Signed)
Utilization review completed.  

## 2012-04-06 NOTE — ED Notes (Signed)
Pt resting quietly at the time. Continuous nebulizer being administered at present.

## 2012-04-06 NOTE — ED Notes (Signed)
Respiratory therapist at the bedside, pt to receive hour long nebulizer, will continue to monitor closely. Vital signs stable at the time.

## 2012-07-30 ENCOUNTER — Other Ambulatory Visit: Payer: Self-pay

## 2012-09-15 ENCOUNTER — Encounter: Payer: Self-pay | Admitting: *Deleted

## 2012-09-19 ENCOUNTER — Encounter: Payer: Self-pay | Admitting: Nurse Practitioner

## 2012-09-19 ENCOUNTER — Ambulatory Visit (INDEPENDENT_AMBULATORY_CARE_PROVIDER_SITE_OTHER): Payer: BC Managed Care – PPO | Admitting: Nurse Practitioner

## 2012-09-19 VITALS — BP 112/70 | HR 80 | Ht 60.0 in | Wt 219.8 lb

## 2012-09-19 DIAGNOSIS — A499 Bacterial infection, unspecified: Secondary | ICD-10-CM

## 2012-09-19 DIAGNOSIS — Z0000 Encounter for general adult medical examination without abnormal findings: Secondary | ICD-10-CM | POA: Insufficient documentation

## 2012-09-19 DIAGNOSIS — N760 Acute vaginitis: Secondary | ICD-10-CM

## 2012-09-19 DIAGNOSIS — B9689 Other specified bacterial agents as the cause of diseases classified elsewhere: Secondary | ICD-10-CM

## 2012-09-19 DIAGNOSIS — L732 Hidradenitis suppurativa: Secondary | ICD-10-CM

## 2012-09-19 LAB — POCT WET PREP WITH KOH
Clue Cells Wet Prep HPF POC: POSITIVE
Epithelial Wet Prep HPF POC: POSITIVE
KOH Prep POC: POSITIVE
Trichomonas, UA: NEGATIVE
WBC Wet Prep HPF POC: NEGATIVE
pH, Wet Prep: 5.5

## 2012-09-19 MED ORDER — METRONIDAZOLE 500 MG PO TABS: 500.0000 mg | ORAL_TABLET | Freq: Two times a day (BID) | ORAL | Status: AC

## 2012-09-19 MED ORDER — MINOCYCLINE HCL 50 MG PO CAPS: 50.0000 mg | ORAL_CAPSULE | Freq: Two times a day (BID) | ORAL | Status: DC

## 2012-09-19 MED ORDER — NAPROXEN 500 MG PO TABS: 500.0000 mg | ORAL_TABLET | Freq: Two times a day (BID) | ORAL | Status: DC

## 2012-09-19 NOTE — Progress Notes (Signed)
Subjective:  Presents for her wellness checkup. No new sexual partners. Slight vaginal discharge, no fever or pelvic pain. Patient is looking into bariatric surgery, has an appointment with her specialist next week. History of hidradenitis suppurativa. A few new lesions under the right breast. Minimal tenderness. Cannot take doxycycline due to nausea. Had an eye exam about 3 years ago. Does get some dental care but not on a regular basis. No chest pain or shortness of breath. Mild head congestion but no major asthma exacerbation.  Objective:   BP 112/70  Pulse 80  Ht 5' (1.524 m)  Wt 219 lb 12.8 oz (99.701 kg)  BMI 42.93 kg/m2 NAD. Alert, oriented. TMs normal limit. Pharynx clear. Neck supple with minimal adenopathy. Thyroid normal limit to palpation and nontender. Lungs clear. Heart regular rate rhythm. Abdomen soft nondistended nontender. Breast exam normal limit. 2 superficial slightly raised mildly erythematous smooth lesions noted on the bottom of the right breast. Multiple small scars noted from previous infections in the axilla. External GU normal limit. Vagina small amount of white mucoid discharge. Patient has had a hysterectomy but does have a cervix. Normal limit in appearance. No Pap obtained today, her last one was in 2012. Bimanual exam normal limit, limited due to abdominal girth. Wet prep pH 5.5 with clue cells noted.

## 2012-09-19 NOTE — Assessment & Plan Note (Signed)
Stop doxycycline. Switch to minocycline 50 mg 1 by mouth twice a day when necessary. Call back if any problems.

## 2012-09-19 NOTE — Assessment & Plan Note (Signed)
Followup with bariatric surgery next week as planned.

## 2012-09-19 NOTE — Assessment & Plan Note (Signed)
Flagyl 500 mg 1 by mouth twice a day x7 days. Also given instruction sheet. Call back if persists.

## 2012-09-19 NOTE — Patient Instructions (Addendum)
Dr. Lowell Guitar at [facility] does single incision for sleeve gastrectomyBacterial Vaginosis Bacterial vaginosis (BV) is a vaginal infection where the normal balance of bacteria in the vagina is disrupted. The normal balance is then replaced by an overgrowth of certain bacteria. There are several different kinds of bacteria that can cause BV. BV is the most common vaginal infection in women of childbearing age. CAUSES   The cause of BV is not fully understood. BV develops when there is an increase or imbalance of harmful bacteria.  Some activities or behaviors can upset the normal balance of bacteria in the vagina and put women at increased risk including:  Having a new sex partner or multiple sex partners.  Douching.  Using an intrauterine device (IUD) for contraception.  It is not clear what role sexual activity plays in the development of BV. However, women that have never had sexual intercourse are rarely infected with BV. Women do not get BV from toilet seats, bedding, swimming pools or from touching objects around them.  SYMPTOMS   Grey vaginal discharge.  A fish-like odor with discharge, especially after sexual intercourse.  Itching or burning of the vagina and vulva.  Burning or pain with urination.  Some women have no signs or symptoms at all. DIAGNOSIS  Your caregiver must examine the vagina for signs of BV. Your caregiver will perform lab tests and look at the sample of vaginal fluid through a microscope. They will look for bacteria and abnormal cells (clue cells), a pH test higher than 4.5, and a positive amine test all associated with BV.  RISKS AND COMPLICATIONS   Pelvic inflammatory disease (PID).  Infections following gynecology surgery.  Developing HIV.  Developing herpes virus. TREATMENT  Sometimes BV will clear up without treatment. However, all women with symptoms of BV should be treated to avoid complications, especially if gynecology surgery is planned. Female  partners generally do not need to be treated. However, BV may spread between female sex partners so treatment is helpful in preventing a recurrence of BV.   BV may be treated with antibiotics. The antibiotics come in either pill or vaginal cream forms. Either can be used with nonpregnant or pregnant women, but the recommended dosages differ. These antibiotics are not harmful to the baby.  BV can recur after treatment. If this happens, a second round of antibiotics will often be prescribed.  Treatment is important for pregnant women. If not treated, BV can cause a premature delivery, especially for a pregnant woman who had a premature birth in the past. All pregnant women who have symptoms of BV should be checked and treated.  For chronic reoccurrence of BV, treatment with a type of prescribed gel vaginally twice a week is helpful. HOME CARE INSTRUCTIONS   Finish all medication as directed by your caregiver.  Do not have sex until treatment is completed.  Tell your sexual partner that you have a vaginal infection. They should see their caregiver and be treated if they have problems, such as a mild rash or itching.  Practice safe sex. Use condoms. Only have 1 sex partner. PREVENTION  Basic prevention steps can help reduce the risk of upsetting the natural balance of bacteria in the vagina and developing BV:  Do not have sexual intercourse (be abstinent).  Do not douche.  Use all of the medicine prescribed for treatment of BV, even if the signs and symptoms go away.  Tell your sex partner if you have BV. That way, they can be treated, if  needed, to prevent reoccurrence. SEEK MEDICAL CARE IF:   Your symptoms are not improving after 3 days of treatment.  You have increased discharge, pain, or fever. MAKE SURE YOU:   Understand these instructions.  Will watch your condition.  Will get help right away if you are not doing well or get worse. FOR MORE INFORMATION  Division of STD  Prevention (DSTDP), Centers for Disease Control and Prevention: [employer] American Social Health Association (ASHA): www.ashastd.org  Document Released: 06/01/2005 Document Revised: 08/24/2011 Document Reviewed: 11/22/2008 [facility] Patient Information 2013 [place], [place].

## 2012-09-19 NOTE — Assessment & Plan Note (Signed)
Refill naproxen. No migraines are stable on Topamax, continue for now. Next physical in one year, will need a Pap smear at that time. Defers STI testing. Routine followup in 3 months.

## 2012-09-28 DIAGNOSIS — I10 Essential (primary) hypertension: Secondary | ICD-10-CM | POA: Insufficient documentation

## 2012-09-28 DIAGNOSIS — E785 Hyperlipidemia, unspecified: Secondary | ICD-10-CM | POA: Insufficient documentation

## 2012-09-28 DIAGNOSIS — K219 Gastro-esophageal reflux disease without esophagitis: Secondary | ICD-10-CM | POA: Insufficient documentation

## 2012-10-25 ENCOUNTER — Other Ambulatory Visit (HOSPITAL_COMMUNITY): Payer: Self-pay | Admitting: Family Medicine

## 2012-12-21 ENCOUNTER — Ambulatory Visit: Payer: BC Managed Care – PPO | Admitting: Nurse Practitioner

## 2012-12-23 ENCOUNTER — Telehealth: Payer: Self-pay | Admitting: Family Medicine

## 2012-12-23 NOTE — Telephone Encounter (Signed)
Patient notified that zyrtec was filled last month with 11 refills. Patient will contact pharmacy. Verbalized understanding.

## 2012-12-23 NOTE — Telephone Encounter (Signed)
Patient needs zyrtec-gel type refilled to [facility] [place]

## 2013-01-24 ENCOUNTER — Encounter: Payer: Self-pay | Admitting: Family Medicine

## 2013-01-24 ENCOUNTER — Encounter: Payer: Self-pay | Admitting: Nurse Practitioner

## 2013-01-24 ENCOUNTER — Ambulatory Visit (INDEPENDENT_AMBULATORY_CARE_PROVIDER_SITE_OTHER): Payer: BC Managed Care – PPO | Admitting: Nurse Practitioner

## 2013-01-24 VITALS — BP 120/92 | Temp 98.3°F | Ht 60.0 in | Wt 219.8 lb

## 2013-01-24 DIAGNOSIS — J0110 Acute frontal sinusitis, unspecified: Secondary | ICD-10-CM

## 2013-01-24 MED ORDER — METHYLPREDNISOLONE ACETATE 40 MG/ML IJ SUSP
40.0000 mg | Freq: Once | INTRAMUSCULAR | Status: AC
  Administered 2013-01-24: 40 mg via INTRAMUSCULAR

## 2013-01-24 MED ORDER — AMOXICILLIN-POT CLAVULANATE 875-125 MG PO TABS: 1.0000 | ORAL_TABLET | Freq: Two times a day (BID) | ORAL | Status: DC

## 2013-01-24 MED ORDER — CLOBETASOL PROPIONATE 0.05 % EX CREA: TOPICAL_CREAM | Freq: Two times a day (BID) | CUTANEOUS | Status: DC

## 2013-01-25 ENCOUNTER — Encounter: Payer: Self-pay | Admitting: Nurse Practitioner

## 2013-01-25 NOTE — Progress Notes (Signed)
Subjective:  Presents complaints of intense sinus symptoms that began yesterday. Some fever which has improved. Facial area headache particularly in the right frontal area. Sore throat. Ear pain. Occasional cough. Producing green nucleus at times. Slight wheezing, used her inhaler twice yesterday with slight improvement. No nausea vomiting. Mild lower abdominal pain, slightly loose stools. No watery stools. Sneezing. Body aches. Taking fluids well. Voiding normal limit. Also the end of visit complaints of lesions on her fingers occasionally around her waist and thighs.  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  Rash pruritic.  Several coworkers also have similar rash.   Objective:   BP 120/92  Temp(Src) 98.3 F (36.8 C) (Oral)  Ht 5' (1.524 m)  Wt 219 lb 12.8 oz (99.701 kg)  BMI 42.93 kg/m2 NAD. Alert, oriented. TMs clear effusion, no erythema more so on the right. Pharynx injected with PND noted. Neck supple with mild soft slightly tender adenopathy. Lungs clear. Heart regular rhythm. Abdomen soft nondistended nontender. Significant sinus pressure noted. Nasal mucosa pale and very boggy occluded on the right. Raised mildly erythematous papules noted on fingers.  Nontender to palpation.    Assessment:Sinusitis, acute frontal - Plan: methylPREDNISolone acetate (DEPO-MEDROL) injection 40 mg Probable insect bites Plan: Meds ordered this encounter  Medications  . clobetasol cream (TEMOVATE) 0.05 %    Sig: Apply topically 2 (two) times daily.    Dispense:  30 g    Refill:  0    Order Specific Question:  Supervising Provider    Answer:  Merlyn Albert [[id number]]  . amoxicillin-clavulanate (AUGMENTIN) 875-125 MG per tablet    Sig: Take 1 tablet by mouth 2 (two) times daily.    Dispense:  20 tablet    Refill:  0    Order Specific Question:  Supervising Provider    Answer:  Merlyn Albert [[id number]]  . methylPREDNISolone acetate (DEPO-MEDROL) injection 40 mg    Sig:    OTC meds, antihistamines  as directed.  may use clobetasol cream up to 2 weeks at a time. Call back if any symptoms worsen or persist.

## 2013-02-03 ENCOUNTER — Encounter: Payer: Self-pay | Admitting: Family Medicine

## 2013-02-03 ENCOUNTER — Ambulatory Visit (INDEPENDENT_AMBULATORY_CARE_PROVIDER_SITE_OTHER): Payer: BC Managed Care – PPO | Admitting: Family Medicine

## 2013-02-03 VITALS — BP 132/84 | Temp 98.6°F | Ht 60.0 in | Wt 213.4 lb

## 2013-02-03 DIAGNOSIS — R1032 Left lower quadrant pain: Secondary | ICD-10-CM

## 2013-02-03 DIAGNOSIS — K589 Irritable bowel syndrome without diarrhea: Secondary | ICD-10-CM | POA: Insufficient documentation

## 2013-02-03 MED ORDER — ONDANSETRON 4 MG PO TBDP: ORAL_TABLET | ORAL | Status: DC

## 2013-02-03 MED ORDER — HYOSCYAMINE SULFATE ER 0.375 MG PO TB12: 0.3750 mg | ORAL_TABLET | Freq: Two times a day (BID) | ORAL | Status: DC | PRN

## 2013-02-03 NOTE — Patient Instructions (Signed)
Use miralax daily, be sure to follow up as scheduled

## 2013-02-03 NOTE — Progress Notes (Signed)
  Subjective:    Patient ID: Jasmin Chen, female    DOB: 09-Jul-1980, 32 y.o.   MRN: [id number]  Abdominal Pain This is a new problem. The current episode started more than 1 month ago. The problem has been waxing and waning. The pain is located in the LLQ. The pain is moderate. The quality of the pain is colicky.   Left side abdominal pain. Started about a month ago, and progressively got worst. Vomiting, diarrhea, and headache started a few days ago. Took alka-seltzer and Ibu.   No other concerns.   crampy at times, pain sharp, fibroids led to hysterectomy  Pos hx of ibs--takes miralx, yellow vomit  Patient will goes days and sometimes weeks between spells. Family worried about gallbladder. Some family history of gallbladder disease.   Does admit to a lot of stress, and recently has been having more and more headaches Review of Systems  Gastrointestinal: Positive for abdominal pain.   no fever. No blood in stools. No weight loss weight gain.     Objective:   Physical Exam  Alert hydration good. Vital stable. HEENT normal. Lungs clear. Heart regular rate and rhythm. Abdomen good bowel sounds mild left lower abdominal tenderness no epigastric or right upper quadrant tenderness. No rebound no guarding      Assessment & Plan:  Impression intermittent IBS with brisk vomiting reflux. Doubt serious underlying etiology with intermittent spells of 0 symptoms discussed at length. Highly doubt gallbladder. We'll hold off on workup tear. Plan Levbid one twice a day. Zofran when necessary. Go ahead and add MiraLAX daily both for constipation and IBS component. Patient sometimes gets constipated between spells. Plan followup with Dr. Lorin Picket couple weeks. Warning signs discussed. 25 minutes spent most in discussion. WSL

## 2013-02-04 ENCOUNTER — Emergency Department (HOSPITAL_COMMUNITY)
Admission: EM | Admit: 2013-02-04 | Discharge: 2013-02-04 | Disposition: A | Discharge status: Home / Self Care | Payer: BC Managed Care – PPO | Attending: Emergency Medicine | Admitting: Emergency Medicine

## 2013-02-04 ENCOUNTER — Encounter (HOSPITAL_COMMUNITY): Payer: Self-pay | Admitting: Emergency Medicine

## 2013-02-04 ENCOUNTER — Emergency Department (HOSPITAL_COMMUNITY): Payer: BC Managed Care – PPO

## 2013-02-04 DIAGNOSIS — R112 Nausea with vomiting, unspecified: Secondary | ICD-10-CM

## 2013-02-04 DIAGNOSIS — Z8709 Personal history of other diseases of the respiratory system: Secondary | ICD-10-CM | POA: Insufficient documentation

## 2013-02-04 DIAGNOSIS — J45909 Unspecified asthma, uncomplicated: Secondary | ICD-10-CM | POA: Insufficient documentation

## 2013-02-04 DIAGNOSIS — Z3202 Encounter for pregnancy test, result negative: Secondary | ICD-10-CM | POA: Insufficient documentation

## 2013-02-04 DIAGNOSIS — Z9109 Other allergy status, other than to drugs and biological substances: Secondary | ICD-10-CM | POA: Insufficient documentation

## 2013-02-04 DIAGNOSIS — Z8719 Personal history of other diseases of the digestive system: Secondary | ICD-10-CM | POA: Insufficient documentation

## 2013-02-04 DIAGNOSIS — Z791 Long term (current) use of non-steroidal anti-inflammatories (NSAID): Secondary | ICD-10-CM | POA: Insufficient documentation

## 2013-02-04 DIAGNOSIS — G43009 Migraine without aura, not intractable, without status migrainosus: Secondary | ICD-10-CM | POA: Insufficient documentation

## 2013-02-04 DIAGNOSIS — Z8742 Personal history of other diseases of the female genital tract: Secondary | ICD-10-CM | POA: Insufficient documentation

## 2013-02-04 DIAGNOSIS — R197 Diarrhea, unspecified: Secondary | ICD-10-CM | POA: Insufficient documentation

## 2013-02-04 DIAGNOSIS — Z79899 Other long term (current) drug therapy: Secondary | ICD-10-CM | POA: Insufficient documentation

## 2013-02-04 DIAGNOSIS — Z872 Personal history of diseases of the skin and subcutaneous tissue: Secondary | ICD-10-CM | POA: Insufficient documentation

## 2013-02-04 DIAGNOSIS — I10 Essential (primary) hypertension: Secondary | ICD-10-CM | POA: Insufficient documentation

## 2013-02-04 HISTORY — DX: Essential (primary) hypertension: I10

## 2013-02-04 LAB — BASIC METABOLIC PANEL
BUN: 11 mg/dL (ref 6–23)
CO2: 24 mEq/L (ref 19–32)
Calcium: 9.3 mg/dL (ref 8.4–10.5)
Chloride: 104 mEq/L (ref 96–112)
Creatinine, Ser: 0.5 mg/dL (ref 0.50–1.10)
GFR calc Af Amer: >90 mL/min (ref >90)
GFR calc non Af Amer: >90 mL/min (ref >90)
Glucose, Bld: 92 mg/dL (ref 70–99)
Potassium: 3.7 mEq/L (ref 3.5–5.1)
Sodium: 139 mEq/L (ref 135–145)

## 2013-02-04 LAB — CBC
HCT: 39.1 % (ref 36.0–46.0)
Hemoglobin: 13.6 g/dL (ref 12.0–15.0)
MCH: 28.9 pg (ref 26.0–34.0)
MCHC: 34.8 g/dL (ref 30.0–36.0)
MCV: 83 fL (ref 78.0–100.0)
Platelets: 236 10*3/uL (ref 150–400)
RBC: 4.71 MIL/uL (ref 3.87–5.11)
RDW: 14.4 % (ref 11.5–15.5)
WBC: 6.4 10*3/uL (ref 4.0–10.5)

## 2013-02-04 LAB — URINALYSIS, ROUTINE W REFLEX MICROSCOPIC
Bilirubin Urine: NEGATIVE
Glucose, UA: NEGATIVE mg/dL
Hgb urine dipstick: NEGATIVE
Ketones, ur: NEGATIVE mg/dL
Leukocytes, UA: NEGATIVE
Nitrite: NEGATIVE
Protein, ur: NEGATIVE mg/dL
Specific Gravity, Urine: 1.026 (ref 1.005–1.030)
Urobilinogen, UA: 0.2 mg/dL (ref 0.0–1.0)
pH: 6.5 (ref 5.0–8.0)

## 2013-02-04 LAB — HEPATIC FUNCTION PANEL
ALT: 10 U/L (ref 0–35)
AST: 10 U/L (ref 0–37)
Albumin: 4 g/dL (ref 3.5–5.2)
Alkaline Phosphatase: 51 U/L (ref 39–117)
Bilirubin, Direct: <0.1 mg/dL (ref 0.0–0.3)
Total Bilirubin: 0.5 mg/dL (ref 0.3–1.2)
Total Protein: 7.5 g/dL (ref 6.0–8.3)

## 2013-02-04 LAB — LACTIC ACID, PLASMA: Lactic Acid, Venous: 1.1 mmol/L (ref 0.5–2.2)

## 2013-02-04 LAB — POCT PREGNANCY, URINE: Preg Test, Ur: NEGATIVE

## 2013-02-04 LAB — LIPASE, BLOOD: Lipase: 15 U/L (ref 11–59)

## 2013-02-04 LAB — WET PREP, GENITAL
Clue Cells Wet Prep HPF POC: NONE SEEN
Trich, Wet Prep: NONE SEEN
WBC, Wet Prep HPF POC: NONE SEEN
Yeast Wet Prep HPF POC: NONE SEEN

## 2013-02-04 MED ORDER — ONDANSETRON HCL 4 MG/2ML IJ SOLN
4.0000 mg | Freq: Once | INTRAMUSCULAR | Status: AC
  Administered 2013-02-04: 4 mg via INTRAVENOUS
  Filled 2013-02-04: qty 2

## 2013-02-04 MED ORDER — MORPHINE SULFATE 4 MG/ML IJ SOLN
4.0000 mg | Freq: Once | INTRAMUSCULAR | Status: AC
  Administered 2013-02-04: 4 mg via INTRAVENOUS
  Filled 2013-02-04: qty 1

## 2013-02-04 MED ORDER — SODIUM CHLORIDE 0.9 % IV BOLUS (SEPSIS)
1000.0000 mL | Freq: Once | INTRAVENOUS | Status: DC
Start: 2013-02-04 — End: 2013-02-04

## 2013-02-04 MED ORDER — ONDANSETRON 4 MG PO TBDP: ORAL_TABLET | ORAL | Status: DC

## 2013-02-04 MED ORDER — IOHEXOL 300 MG/ML  SOLN
100.0000 mL | Freq: Once | INTRAMUSCULAR | Status: AC | PRN
  Administered 2013-02-04: 100 mL via INTRAVENOUS

## 2013-02-04 MED ORDER — IOHEXOL 300 MG/ML  SOLN: 25.0000 mL | INTRAMUSCULAR | Status: DC | PRN

## 2013-02-04 MED ORDER — SODIUM CHLORIDE 0.9 % IV BOLUS (SEPSIS)
1000.0000 mL | Freq: Once | INTRAVENOUS | Status: AC
  Administered 2013-02-04: 1000 mL via INTRAVENOUS

## 2013-02-04 MED ORDER — SODIUM CHLORIDE 0.9 % IV BOLUS (SEPSIS): 1000.0000 mL | Freq: Once | INTRAVENOUS | Status: DC

## 2013-02-04 NOTE — ED Notes (Signed)
Failed attempt at IV start in right hand. Requested second nurse to look for IV.

## 2013-02-04 NOTE — ED Notes (Signed)
Pt tolerating PO fluids. Pt states feeling better and ready for d/c. Pt not given second bolus with verbal verification by Dr Gwendolyn Grant since pt can handle po fluids.

## 2013-02-04 NOTE — ED Provider Notes (Signed)
CSN: [id number]     Arrival date & time 02/04/13  1207 History     First MD Initiated Contact with Patient 02/04/13 1210     Chief Complaint  Patient presents with  . Abdominal Pain    abdominal pain, n/v/d   (Consider location/radiation/quality/duration/timing/severity/associated sxs/prior Treatment) Patient is a 32 y.o. female presenting with abdominal pain. The history is provided by the patient.  Abdominal Pain Pain location:  LLQ Pain quality: sharp   Pain radiates to:  L flank (occasionally radiates) Pain severity:  Moderate Onset quality:  Gradual Duration:  2 days Timing:  Intermittent Progression:  Worsening Chronicity:  New Context: recent illness (on Amoxicillin last weeek)   Context: not eating, not sick contacts, not suspicious food intake and not trauma   Relieved by:  Nothing Worsened by:  Nothing tried Associated symptoms: diarrhea, nausea and vomiting   Associated symptoms: no chills, no cough, no fever and no shortness of breath     Past Medical History  Diagnosis Date  . Asthma   . Multiple environmental allergies   . IBS (irritable bowel syndrome)   . Chronic rhinitis   . Migraine without aura 2009  . Hidradenitis suppurativa    Past Surgical History  Procedure Laterality Date  . Deviated septum repair    . 2 cysts removed from arm    . Breast reduction surgery    . Abdominal hysterectomy     No family history on file. History  Substance Use Topics  . Smoking status: Never Smoker   . Smokeless tobacco: Not on file  . Alcohol Use: No   OB History   Grav Para Term Preterm Abortions TAB SAB Ect Mult Living                 Review of Systems  Constitutional: Negative for fever and chills.  Respiratory: Negative for cough and shortness of breath.   Gastrointestinal: Positive for nausea, vomiting, abdominal pain and diarrhea.  Skin: Negative for rash.  All other systems reviewed and are negative.    Allergies  Prednisone  Home  Medications   Current Outpatient Rx  Name  Route  Sig  Dispense  Refill  . albuterol (PROVENTIL HFA;VENTOLIN HFA) 108 (90 BASE) MCG/ACT inhaler   Inhalation   Inhale 2 puffs into the lungs every 4 (four) hours as needed.           Marland Kitchen albuterol (PROVENTIL) (2.5 MG/3ML) 0.083% nebulizer solution   Nebulization   Take 2.5 mg by nebulization every 4 (four) hours as needed.           . hyoscyamine (LEVBID) 0.375 MG 12 hr tablet   Oral   Take 1 tablet (0.375 mg total) by mouth every 12 (twelve) hours as needed for cramping.   60 tablet   0   . lisinopril-hydrochlorothiazide (PRINZIDE,ZESTORETIC) 20-12.5 MG per tablet   Oral   Take 1 tablet by mouth daily as needed. Depends on BP         . minocycline (MINOCIN,DYNACIN) 50 MG capsule   Oral   Take 1 capsule (50 mg total) by mouth 2 (two) times daily. As needed for rash   60 capsule   5   . mometasone (NASONEX) 50 MCG/ACT nasal spray   Nasal   2 sprays by Nasal route daily. Use one squirt in each nostrile          . naproxen (NAPROSYN) 500 MG tablet   Oral   Take 1 tablet (  500 mg total) by mouth 2 (two) times daily with a meal.   30 tablet   5   . ondansetron (ZOFRAN ODT) 4 MG disintegrating tablet      One every 6 hours prn nausea.   24 tablet   3   . topiramate (TOPAMAX) 50 MG tablet   Oral   Take 50 mg by mouth 2 (two) times daily.         Marland Kitchen ZYRTEC ALLERGY 10 MG tablet      TAKE 1 TABLET BY MOUTH EVERY DAY   30 tablet   11     PT WOULD LIKE LIQUID GELS    BP 120/79  Pulse 70  Temp(Src) 97.5 F (36.4 C) (Oral)  Resp 18  SpO2 98% Physical Exam  Nursing note and vitals reviewed. Constitutional: She is oriented to person, place, and time. She appears well-developed and well-nourished. No distress.  HENT:  Head: Normocephalic and atraumatic.  Eyes: EOM are normal. Pupils are equal, round, and reactive to light.  Neck: Normal range of motion. Neck supple.  Cardiovascular: Normal rate and regular  rhythm.  Exam reveals no friction rub.   No murmur heard. Pulmonary/Chest: Effort normal and breath sounds normal. No respiratory distress. She has no wheezes. She has no rales.  Abdominal: Soft. She exhibits no distension. There is tenderness (diffuse lower abdomen, LLQ > RLQ). There is no rebound and no guarding.  Genitourinary: Vagina normal. Cervix exhibits no motion tenderness, no discharge and no friability. Right adnexum displays no mass, no tenderness and no fullness. Left adnexum displays tenderness (mild). Left adnexum displays no mass and no fullness.  Musculoskeletal: Normal range of motion. She exhibits no edema.  Neurological: She is alert and oriented to person, place, and time.  Skin: She is not diaphoretic.    ED Course   Procedures (including critical care time)  Labs Reviewed  GC/CHLAMYDIA PROBE AMP  WET PREP, GENITAL  CBC  BASIC METABOLIC PANEL  HEPATIC FUNCTION PANEL  URINALYSIS, ROUTINE W REFLEX MICROSCOPIC  LIPASE, BLOOD   Ct Abdomen Pelvis W Contrast  02/04/2013   *RADIOLOGY REPORT*  Clinical Data: Left lower quadrant abdominal pain, diarrhea, nausea and vomiting for the past 3 days.  Surgical history includes myomectomy.  CT ABDOMEN AND PELVIS WITH CONTRAST  Technique:  Multidetector CT imaging of the abdomen and pelvis was performed following the standard protocol during bolus administration of intravenous contrast.  Contrast: OMNIPAQUE IOHEXOL 300 MG/ML IV. Oral contrast was also administered.  Comparison: CT abdomen and pelvis 07/07/2010.  Findings: Apparent wall thickening involving the transverse colon, distal sigmoid colon, and rectum is due to the fact that these segments are decompressed; no pericolonic inflammation.  Moderate to large stool burden.  No visible colonic diverticula.  Normal- appearing stomach and small bowel.  No ascites.  Normal appearing liver with an anatomic variant in that the left lobe extends well across the midline into the left  upper quadrant. Normal spleen, pancreas, adrenal glands, and kidneys.  Gallbladder unremarkable.  No biliary ductal dilation.  No visible aorto- iliofemoral atherosclerosis.  Mildly enlarged lymph node in the mesentery adjacent to the cecum measuring approximately 2.2 x 1.5 cm (image 48), unchanged from the prior examinations; no significant lymphadenopathy.  Normal-appearing uterus with interval myomectomy since the prior CT.  Nabothian cyst on the cervix.  Mildly enlarged right ovary containing multiple small cysts measuring approximately 5.2 x 3.9 cm (series 2, image 66).  Normal sized left ovary containing multiple small  cysts.  Post surgical scarring involving the subcutaneous tissues anteriorly in the low pelvis related to the prior myomectomy.  Urinary bladder unremarkable.  Bone window images unremarkable.  Visualized lung bases clear. Heart size normal.  IMPRESSION:  1.  No acute abnormalities involving the abdomen or pelvis. 2.  Mildly enlarged lymph node in the mesentery adjacent to the ascending colon, unchanged since 2012 and therefore consistent with a reactive node. 3.  Mildly enlarged right ovary containing multiple small cysts. Normal sized left ovary containing multiple small cysts.   No free pelvic fluid to suggest recent cyst rupture.   Original Report Authenticated By: Hulan Saas, M.D.   1. Nausea vomiting and diarrhea     MDM  35F presents with N/V/D for past 2 days. No fevers. LLQ abdominal pain. Denies rectal bleeding.  AFVSS here. Diffuse lower abdominal tenderness, LLQ > RLQ. No RUQ tenderness. Recently on Amoxicillinfor sinusitis. Will send C. Diff.  Labs normal. Pelvic with mild L sided tenderness, no masses or fullness. No vaginal bleeding. Patient denies vaginal bleeding, no fevers, no concern for TOA. Awaiting CT results.  CT abdomen without acute findings. Stable for discharge, given Zofran.   Dagmar Hait, MD 02/04/13 1540

## 2013-02-04 NOTE — ED Notes (Signed)
Pt denies need to have BM And has been unable to provide stool sample for c.diff culture. Dr Gwendolyn Grant notified earlier and aware of delay in collection.

## 2013-02-04 NOTE — ED Notes (Signed)
Pt alert and mentating appropriately upon d/c. Pt given d/c teaching and follow up care instructions with Rx. Pt verbalizes understanding and has no further questions upon d/c. Pt ambulatory in room but requested wheelchair out. Pt helped into mother's vehicle. NAD noted upon d/c. Pt instructed not to drive and verified mother would be driving pt home.

## 2013-02-04 NOTE — ED Notes (Signed)
Pt reports sharp abdominal pain that is worse on the left side that started on Thursday, "feels like somebody is stabbing me in the left side."  Reports nausea, vomiting, diarrhea.  Vomited twice today..   Denies blood or dark color in her stool and vomit.

## 2013-02-06 ENCOUNTER — Encounter: Payer: Self-pay | Admitting: Adult Health

## 2013-02-06 ENCOUNTER — Ambulatory Visit (INDEPENDENT_AMBULATORY_CARE_PROVIDER_SITE_OTHER): Payer: BC Managed Care – PPO | Admitting: Adult Health

## 2013-02-06 VITALS — BP 112/70 | Ht 60.0 in | Wt 214.0 lb

## 2013-02-06 DIAGNOSIS — R1032 Left lower quadrant pain: Secondary | ICD-10-CM

## 2013-02-06 DIAGNOSIS — N83209 Unspecified ovarian cyst, unspecified side: Secondary | ICD-10-CM | POA: Insufficient documentation

## 2013-02-06 HISTORY — DX: Unspecified ovarian cyst, unspecified side: N83.209

## 2013-02-06 LAB — GC/CHLAMYDIA PROBE AMP
CT Probe RNA: NEGATIVE
GC Probe RNA: NEGATIVE

## 2013-02-06 MED ORDER — NORETHINDRONE 0.35 MG PO TABS: 1.0000 | ORAL_TABLET | Freq: Every day | ORAL | Status: DC

## 2013-02-06 MED ORDER — HYDROCODONE-ACETAMINOPHEN 5-325 MG PO TABS: 1.0000 | ORAL_TABLET | Freq: Four times a day (QID) | ORAL | Status: DC | PRN

## 2013-02-06 NOTE — Patient Instructions (Addendum)
Ovarian Cyst The ovaries are small organs that are on each side of the uterus. The ovaries are the organs that produce the female hormones, estrogen and progesterone. An ovarian cyst is a sac filled with fluid that can vary in its size. It is normal for a small cyst to form in women who are in the childbearing age and who have menstrual periods. This type of cyst is called a follicle cyst that becomes an ovulation cyst (corpus luteum cyst) after it produces the women's egg. It later goes away on its own if the woman does not become pregnant. There are other kinds of ovarian cysts that may cause problems and may need to be treated. The most serious problem is a cyst with cancer. It should be noted that menopausal women who have an ovarian cyst are at a higher risk of it being a cancer cyst. They should be evaluated very quickly, thoroughly and followed closely. This is especially true in menopausal women because of the high rate of ovarian cancer in women in menopause. CAUSES AND TYPES OF OVARIAN CYSTS:  FUNCTIONAL CYST: The follicle/corpus luteum cyst is a functional cyst that occurs every month during ovulation with the menstrual cycle. They go away with the next menstrual cycle if the woman does not get pregnant. Usually, there are no symptoms with a functional cyst.  ENDOMETRIOMA CYST: This cyst develops from the lining of the uterus tissue. This cyst gets in or on the ovary. It grows every month from the bleeding during the menstrual period. It is also called a "chocolate cyst" because it becomes filled with blood that turns brown. This cyst can cause pain in the lower abdomen during intercourse and with your menstrual period.  CYSTADENOMA CYST: This cyst develops from the cells on the outside of the ovary. They usually are not cancerous. They can get very big and cause lower abdomen pain and pain with intercourse. This type of cyst can twist on itself, cut off its blood supply and cause severe pain. It  also can easily rupture and cause a lot of pain.  DERMOID CYST: This type of cyst is sometimes found in both ovaries. They are found to have different kinds of body tissue in the cyst. The tissue includes skin, teeth, hair, and/or cartilage. They usually do not have symptoms unless they get very big. Dermoid cysts are rarely cancerous.  POLYCYSTIC OVARY: This is a rare condition with hormone problems that produces many small cysts on both ovaries. The cysts are follicle-like cysts that never produce an egg and become a corpus luteum. It can cause an increase in body weight, infertility, acne, increase in body and facial hair and lack of menstrual periods or rare menstrual periods. Many women with this problem develop type 2 diabetes. The exact cause of this problem is unknown. A polycystic ovary is rarely cancerous.  THECA LUTEIN CYST: Occurs when too much hormone (human chorionic gonadotropin) is produced and over-stimulates the ovaries to produce an egg. They are frequently seen when doctors stimulate the ovaries for invitro-fertilization (test tube babies).  LUTEOMA CYST: This cyst is seen during pregnancy. Rarely it can cause an obstruction to the birth canal during labor and delivery. They usually go away after delivery. SYMPTOMS   Pelvic pain or pressure.  Pain during sexual intercourse.  Increasing girth (swelling) of the abdomen.  Abnormal menstrual periods.  Increasing pain with menstrual periods.  You stop having menstrual periods and you are not pregnant. DIAGNOSIS  The diagnosis can   be made during:  Routine or annual pelvic examination (common).  Ultrasound.  X-ray of the pelvis.  CT Scan.  MRI.  Blood tests. TREATMENT   Treatment may only be to follow the cyst monthly for 2 to 3 months with your caregiver. Many go away on their own, especially functional cysts.  May be aspirated (drained) with a long needle with ultrasound, or by laparoscopy (inserting a tube into  the pelvis through a small incision).  The whole cyst can be removed by laparoscopy.  Sometimes the cyst may need to be removed through an incision in the lower abdomen.  Hormone treatment is sometimes used to help dissolve certain cysts.  Birth control pills are sometimes used to help dissolve certain cysts. HOME CARE INSTRUCTIONS  Follow your caregiver's advice regarding:  Medicine.  Follow up visits to evaluate and treat the cyst.  You may need to come back or make an appointment with another caregiver, to find the exact cause of your cyst, if your caregiver is not a gynecologist.  Get your yearly and recommended pelvic examinations and Pap tests.  Let your caregiver know if you have had an ovarian cyst in the past. SEEK MEDICAL CARE IF:   Your periods are late, irregular, they stop, or are painful.  Your stomach (abdomen) or pelvic pain does not go away.  Your stomach becomes larger or swollen.  You have pressure on your bladder or trouble emptying your bladder completely.  You have painful sexual intercourse.  You have feelings of fullness, pressure, or discomfort in your stomach.  You lose weight for no apparent reason.  You feel generally ill.  You become constipated.  You lose your appetite.  You develop acne.  You have an increase in body and facial hair.  You are gaining weight, without changing your exercise and eating habits.  You think you are pregnant. SEEK IMMEDIATE MEDICAL CARE IF:   You have increasing abdominal pain.  You feel sick to your stomach (nausea) and/or vomit.  You develop a fever that comes on suddenly.  You develop abdominal pain during a bowel movement.  Your menstrual periods become heavier than usual. Document Released: 06/01/2005 Document Revised: 08/24/2011 Document Reviewed: 04/04/2009 [facility] Patient Information 2014 [place], [place]. Take micronor 1 daily  Return in 6 weeks for [place] and see me

## 2013-02-06 NOTE — Progress Notes (Signed)
Subjective:     Patient ID: Jasmin Chen, female   DOB: [birth year], 32 y.o.   MRN: [id number]  HPI Jasmin Chen is in for follow up of ER visit for pelvic pain.A CT showed ovarian cyst bilaterally with right ovarian enlargement, but her pain is more to left.Had negative GC/CHL and normal labs. She is sp supracervical hysterectomy in 2012.  Review of Systems Positives in HPI Reviewed past medical,surgical, social and family history. Reviewed medications and allergies.     Objective:   Physical Exam BP 112/70  Ht 5' (1.524 m)  Wt 214 lb (97.07 kg)  BMI 41.79 kg/m2    Skin warm and dry.Pelvic: external genitalia is normal in appearance, vagina: is normal in appearance, cervix:smooth, uterus:absent, adnexa: no masses noted.She has tenderness across low pelvis more on the left. Discussed with Dr Despina Hidden  Assessment:     LLQ pain Bilateral ovarian cyst    Plan:     Will rx micronor to suppress ovaries and get follow up US in 6 weeks Rx norco 5/325 mg #30 1 every 6 hours prn pain no refills Review handout on ovarian cyst

## 2013-02-09 ENCOUNTER — Telehealth: Payer: Self-pay | Admitting: Adult Health

## 2013-02-09 NOTE — Telephone Encounter (Signed)
Pt states continues to have abdominal pain due to cyst on ovaries and noticed blood in stools yesterday. Pt is taking norco as prescribed by Cyril Mourning, NP on 02/06/2013. Per Cyril Mourning, NP pt to monitor stool and call office back for any increase in pain or blood in stool. Pt informed to continue norco as prescribed and to keep f/u appt with Cyril Mourning, NP. Pt verbalized understanding.

## 2013-02-17 ENCOUNTER — Ambulatory Visit: Payer: BC Managed Care – PPO | Admitting: Family Medicine

## 2013-03-20 ENCOUNTER — Ambulatory Visit (INDEPENDENT_AMBULATORY_CARE_PROVIDER_SITE_OTHER): Payer: BC Managed Care – PPO

## 2013-03-20 ENCOUNTER — Ambulatory Visit (INDEPENDENT_AMBULATORY_CARE_PROVIDER_SITE_OTHER): Payer: BC Managed Care – PPO | Admitting: Adult Health

## 2013-03-20 ENCOUNTER — Encounter: Payer: Self-pay | Admitting: Adult Health

## 2013-03-20 VITALS — BP 100/70 | Ht 60.0 in | Wt 220.0 lb

## 2013-03-20 DIAGNOSIS — N83209 Unspecified ovarian cyst, unspecified side: Secondary | ICD-10-CM | POA: Insufficient documentation

## 2013-03-20 DIAGNOSIS — R1032 Left lower quadrant pain: Secondary | ICD-10-CM

## 2013-03-20 NOTE — Patient Instructions (Addendum)
Ovarian Cyst The ovaries are small organs that are on each side of the uterus. The ovaries are the organs that produce the female hormones, estrogen and progesterone. An ovarian cyst is a sac filled with fluid that can vary in its size. It is normal for a small cyst to form in women who are in the childbearing age and who have menstrual periods. This type of cyst is called a follicle cyst that becomes an ovulation cyst (corpus luteum cyst) after it produces the women's egg. It later goes away on its own if the woman does not become pregnant. There are other kinds of ovarian cysts that may cause problems and may need to be treated. The most serious problem is a cyst with cancer. It should be noted that menopausal women who have an ovarian cyst are at a higher risk of it being a cancer cyst. They should be evaluated very quickly, thoroughly and followed closely. This is especially true in menopausal women because of the high rate of ovarian cancer in women in menopause. CAUSES AND TYPES OF OVARIAN CYSTS:  FUNCTIONAL CYST: The follicle/corpus luteum cyst is a functional cyst that occurs every month during ovulation with the menstrual cycle. They go away with the next menstrual cycle if the woman does not get pregnant. Usually, there are no symptoms with a functional cyst.  ENDOMETRIOMA CYST: This cyst develops from the lining of the uterus tissue. This cyst gets in or on the ovary. It grows every month from the bleeding during the menstrual period. It is also called a "chocolate cyst" because it becomes filled with blood that turns brown. This cyst can cause pain in the lower abdomen during intercourse and with your menstrual period.  CYSTADENOMA CYST: This cyst develops from the cells on the outside of the ovary. They usually are not cancerous. They can get very big and cause lower abdomen pain and pain with intercourse. This type of cyst can twist on itself, cut off its blood supply and cause severe pain. It  also can easily rupture and cause a lot of pain.  DERMOID CYST: This type of cyst is sometimes found in both ovaries. They are found to have different kinds of body tissue in the cyst. The tissue includes skin, teeth, hair, and/or cartilage. They usually do not have symptoms unless they get very big. Dermoid cysts are rarely cancerous.  POLYCYSTIC OVARY: This is a rare condition with hormone problems that produces many small cysts on both ovaries. The cysts are follicle-like cysts that never produce an egg and become a corpus luteum. It can cause an increase in body weight, infertility, acne, increase in body and facial hair and lack of menstrual periods or rare menstrual periods. Many women with this problem develop type 2 diabetes. The exact cause of this problem is unknown. A polycystic ovary is rarely cancerous.  THECA LUTEIN CYST: Occurs when too much hormone (human chorionic gonadotropin) is produced and over-stimulates the ovaries to produce an egg. They are frequently seen when doctors stimulate the ovaries for invitro-fertilization (test tube babies).  LUTEOMA CYST: This cyst is seen during pregnancy. Rarely it can cause an obstruction to the birth canal during labor and delivery. They usually go away after delivery. SYMPTOMS   Pelvic pain or pressure.  Pain during sexual intercourse.  Increasing girth (swelling) of the abdomen.  Abnormal menstrual periods.  Increasing pain with menstrual periods.  You stop having menstrual periods and you are not pregnant. DIAGNOSIS  The diagnosis can   be made during:  Routine or annual pelvic examination (common).  Ultrasound.  X-ray of the pelvis.  CT Scan.  MRI.  Blood tests. TREATMENT   Treatment may only be to follow the cyst monthly for 2 to 3 months with your caregiver. Many go away on their own, especially functional cysts.  May be aspirated (drained) with a long needle with ultrasound, or by laparoscopy (inserting a tube into  the pelvis through a small incision).  The whole cyst can be removed by laparoscopy.  Sometimes the cyst may need to be removed through an incision in the lower abdomen.  Hormone treatment is sometimes used to help dissolve certain cysts.  Birth control pills are sometimes used to help dissolve certain cysts. HOME CARE INSTRUCTIONS  Follow your caregiver's advice regarding:  Medicine.  Follow up visits to evaluate and treat the cyst.  You may need to come back or make an appointment with another caregiver, to find the exact cause of your cyst, if your caregiver is not a gynecologist.  Get your yearly and recommended pelvic examinations and Pap tests.  Let your caregiver know if you have had an ovarian cyst in the past. SEEK MEDICAL CARE IF:   Your periods are late, irregular, they stop, or are painful.  Your stomach (abdomen) or pelvic pain does not go away.  Your stomach becomes larger or swollen.  You have pressure on your bladder or trouble emptying your bladder completely.  You have painful sexual intercourse.  You have feelings of fullness, pressure, or discomfort in your stomach.  You lose weight for no apparent reason.  You feel generally ill.  You become constipated.  You lose your appetite.  You develop acne.  You have an increase in body and facial hair.  You are gaining weight, without changing your exercise and eating habits.  You think you are pregnant. SEEK IMMEDIATE MEDICAL CARE IF:   You have increasing abdominal pain.  You feel sick to your stomach (nausea) and/or vomit.  You develop a fever that comes on suddenly.  You develop abdominal pain during a bowel movement.  Your menstrual periods become heavier than usual. Document Released: 06/01/2005 Document Revised: 08/24/2011 Document Reviewed: 04/04/2009 [facility] Patient Information 2014 [place], [place]. If has mo pain call can restart micronor if desired Follow up prn

## 2013-03-20 NOTE — Progress Notes (Signed)
Subjective:     Patient ID: Jasmin Chen, female   DOB: [birth year], 32 y.o.   MRN: [id number]  HPI Orva is in for [place] to evaluate ovarian cyst, she took mironor x 1 month but has stopped.Has some pain at times but not bad, no more rectal bleeding seen.  Review of Systems See HPI Reviewed past medical,surgical, social and family history. Reviewed medications and allergies.     Objective:   Physical Exam BP 100/70  Ht 5' (1.524 m)  Wt 220 lb (99.791 kg)  BMI 42.97 kg/m2   reviewed [place] with pt, the US showed simple cyst right ovary, left normal, No CDS fluid.  Assessment:    Simple right ovarian cyst    Plan:      Will follow for now If pain returns may restart micronor Follow up prn or at next physical Review handout on ovarian cyst

## 2013-04-19 ENCOUNTER — Ambulatory Visit (INDEPENDENT_AMBULATORY_CARE_PROVIDER_SITE_OTHER): Payer: BC Managed Care – PPO | Admitting: Family Medicine

## 2013-04-19 ENCOUNTER — Encounter: Payer: Self-pay | Admitting: Family Medicine

## 2013-04-19 DIAGNOSIS — J019 Acute sinusitis, unspecified: Secondary | ICD-10-CM

## 2013-04-19 MED ORDER — LEVOFLOXACIN 500 MG PO TABS: 500.0000 mg | ORAL_TABLET | Freq: Every day | ORAL | Status: AC

## 2013-04-19 MED ORDER — HYDROCODONE-ACETAMINOPHEN 5-325 MG PO TABS: 1.0000 | ORAL_TABLET | Freq: Four times a day (QID) | ORAL | Status: DC | PRN

## 2013-04-19 NOTE — Progress Notes (Signed)
  Subjective:    Patient ID: Jasmin Chen, female    DOB: 1980-11-11, 32 y.o.   MRN: [id number]  Sinusitis This is a new problem. The current episode started today. Associated symptoms include coughing, ear pain, headaches, neck pain, shortness of breath, sinus pressure and sneezing.   We talked at length about losing weight we talked at length about the importance of trying to bring weight down to less than risk factors she has morbid obesity. Her has a strong family history of coronary artery disease and diabetes. Has a form that needs to be filled  out for weight loss surgery. Advised patient she may need to schedule another appt for this since she is sick today.   Review of Systems  HENT: Positive for ear pain, sinus pressure and sneezing.   Respiratory: Positive for cough and shortness of breath.   Musculoskeletal: Positive for neck pain.  Neurological: Positive for headaches.       Objective:   Physical Exam Mild sinus tenderness eardrums normal throat is normal neck supple lungs clear heart regular morbid obesity extremities no edema skin warm dry       Assessment & Plan:  Weight reduction - monthly visits through start of March, dietary measures discussed, regular exercise discussed, log diet as well as exercise. Medications discussed patient not a good candidate and personally I do not believe medications will help this patient greatly. Monthly visits are highly recommended in order to monitor for any problems and also monitor her any improvements with diet and exercise and weight. She will do monthly visits. See her back in one month.  Sinusitis antibiotics prescribed Levaquin 10 days followup if ongoing troubles

## 2013-04-20 ENCOUNTER — Other Ambulatory Visit: Payer: Self-pay

## 2013-04-22 ENCOUNTER — Encounter (HOSPITAL_COMMUNITY): Payer: Self-pay | Admitting: Emergency Medicine

## 2013-04-22 ENCOUNTER — Emergency Department (HOSPITAL_COMMUNITY)
Admission: EM | Admit: 2013-04-22 | Discharge: 2013-04-22 | Disposition: A | Discharge status: Home / Self Care | Payer: BC Managed Care – PPO | Attending: Emergency Medicine | Admitting: Emergency Medicine

## 2013-04-22 DIAGNOSIS — Z8719 Personal history of other diseases of the digestive system: Secondary | ICD-10-CM | POA: Insufficient documentation

## 2013-04-22 DIAGNOSIS — I10 Essential (primary) hypertension: Secondary | ICD-10-CM | POA: Insufficient documentation

## 2013-04-22 DIAGNOSIS — J329 Chronic sinusitis, unspecified: Secondary | ICD-10-CM

## 2013-04-22 DIAGNOSIS — R42 Dizziness and giddiness: Secondary | ICD-10-CM | POA: Insufficient documentation

## 2013-04-22 DIAGNOSIS — Z872 Personal history of diseases of the skin and subcutaneous tissue: Secondary | ICD-10-CM | POA: Insufficient documentation

## 2013-04-22 DIAGNOSIS — J45909 Unspecified asthma, uncomplicated: Secondary | ICD-10-CM | POA: Insufficient documentation

## 2013-04-22 DIAGNOSIS — Z8742 Personal history of other diseases of the female genital tract: Secondary | ICD-10-CM | POA: Insufficient documentation

## 2013-04-22 DIAGNOSIS — IMO0002 Reserved for concepts with insufficient information to code with codable children: Secondary | ICD-10-CM | POA: Insufficient documentation

## 2013-04-22 DIAGNOSIS — Z79899 Other long term (current) drug therapy: Secondary | ICD-10-CM | POA: Insufficient documentation

## 2013-04-22 DIAGNOSIS — J069 Acute upper respiratory infection, unspecified: Secondary | ICD-10-CM

## 2013-04-22 DIAGNOSIS — G43909 Migraine, unspecified, not intractable, without status migrainosus: Secondary | ICD-10-CM | POA: Insufficient documentation

## 2013-04-22 DIAGNOSIS — Z792 Long term (current) use of antibiotics: Secondary | ICD-10-CM | POA: Insufficient documentation

## 2013-04-22 MED ORDER — PROMETHAZINE HCL 25 MG/ML IJ SOLN
25.0000 mg | Freq: Once | INTRAMUSCULAR | Status: AC
  Administered 2013-04-22: 25 mg via INTRAMUSCULAR
  Filled 2013-04-22: qty 1

## 2013-04-22 MED ORDER — KETOROLAC TROMETHAMINE 60 MG/2ML IM SOLN
60.0000 mg | Freq: Once | INTRAMUSCULAR | Status: AC
  Administered 2013-04-22: 60 mg via INTRAMUSCULAR
  Filled 2013-04-22: qty 2

## 2013-04-22 MED ORDER — DIPHENHYDRAMINE HCL 50 MG/ML IJ SOLN
50.0000 mg | Freq: Once | INTRAMUSCULAR | Status: AC
  Administered 2013-04-22: 50 mg via INTRAMUSCULAR
  Filled 2013-04-22: qty 1

## 2013-04-22 MED ORDER — PROMETHAZINE HCL 25 MG PO TABS: 25.0000 mg | ORAL_TABLET | Freq: Four times a day (QID) | ORAL | Status: DC | PRN

## 2013-04-22 NOTE — ED Notes (Signed)
Pt was seen recently by PMD and dx with sinus infection and placed on Levaquin. States pain to head, radiating to shoulders and back. States dizziness when she tries to stand. Symptoms since Wednesday.

## 2013-04-22 NOTE — ED Provider Notes (Signed)
CSN: [id number]     Arrival date & time 04/22/13  1510 History   First MD Initiated Contact with Patient 04/22/13 1639     Chief Complaint  Patient presents with  . Sinusitis  . Dizziness  . Migraine    HPI Pt was seen at 1700. Per pt, c/o gradual onset and persistence of constant runny/stuffy nose, sinus congestion, ears congestion, sneezing, and "dizziness" for the past 4 days.  States she was evaluated by her PMD for same 3 days ago, rx norco and levaquin for "a sinus infection." States she has had a progressive "migraine headache" and feels like the "muscles are tight" in her shoulders and upper back.  Describes the headache as per her usual chronic migraine headache pain pattern for many years.  Denies headache was sudden or maximal in onset or at any time.  Denies visual changes, no focal motor weakness, no tingling/numbness in extremities, no fevers, no neck pain, no rash.  Denies CP/SOB, no cough, no abd pain, no N/V/D, no sore throat.    Past Medical History  Diagnosis Date  . Asthma   . Multiple environmental allergies   . IBS (irritable bowel syndrome)   . Chronic rhinitis   . Migraine without aura 2009  . Hidradenitis suppurativa   . Hypertension   . Other and unspecified ovarian cyst 02/06/2013   Past Surgical History  Procedure Laterality Date  . Deviated septum repair    . 2 cysts removed from arm    . Breast reduction surgery    . Abdominal hysterectomy     Family History  Problem Relation Age of Onset  . Hypertension Mother   . Diabetes Mother   . Hyperlipidemia Mother   . Hypertension Father   . Hyperlipidemia Father   . Heart attack Father   . Sarcoidosis Sister   . Thyroid disease Sister   . Hyperlipidemia Sister   . Hypertension Sister    History  Substance Use Topics  . Smoking status: Never Smoker   . Smokeless tobacco: Never Used  . Alcohol Use: No    Review of Systems ROS: Statement: All systems negative except as marked or noted in the HPI;  Constitutional: Negative for fever and chills. ; ; Eyes: Negative for eye pain, redness and discharge. ; ; ENMT: Negative for ear pain, hoarseness, sore throat. +sneezing, nasal congestion, rhinorrhea, sinus pressure. ; ; Cardiovascular: Negative for chest pain, palpitations, diaphoresis, dyspnea and peripheral edema. ; ; Respiratory: Negative for cough, wheezing and stridor. ; ; Gastrointestinal: Negative for nausea, vomiting, diarrhea, abdominal pain, blood in stool, hematemesis, jaundice and rectal bleeding. . ; ; Genitourinary: Negative for dysuria, flank pain and hematuria. ; ; Musculoskeletal: Negative for back pain and neck pain. Negative for swelling and trauma.; ; Skin: Negative for pruritus, rash, abrasions, blisters, bruising and skin lesion.; ; Neuro: +"dizziness," migraine headache. Negative for neck stiffness. Negative for weakness, altered level of consciousness , altered mental status, extremity weakness, paresthesias, involuntary movement, seizure and syncope.     Allergies  Prednisone  Home Medications   Current Outpatient Rx  Name  Route  Sig  Dispense  Refill  . albuterol (PROVENTIL HFA;VENTOLIN HFA) 108 (90 BASE) MCG/ACT inhaler   Inhalation   Inhale 2 puffs into the lungs every 4 (four) hours as needed.           Marland Kitchen albuterol (PROVENTIL) (2.5 MG/3ML) 0.083% nebulizer solution   Nebulization   Take 2.5 mg by nebulization every 4 (four) hours  as needed.           . cetirizine (ZYRTEC) 10 MG tablet   Oral   Take 10 mg by mouth daily as needed for allergies.         Marland Kitchen HYDROcodone-acetaminophen (NORCO/VICODIN) 5-325 MG per tablet   Oral   Take 1 tablet by mouth every 6 (six) hours as needed. pain         . ibuprofen (ADVIL,MOTRIN) 200 MG tablet   Oral   Take 400 mg by mouth every 6 (six) hours as needed for pain.         Marland Kitchen levofloxacin (LEVAQUIN) 500 MG tablet   Oral   Take 1 tablet (500 mg total) by mouth daily.   10 tablet   0   . mometasone (NASONEX)  50 MCG/ACT nasal spray   Nasal   Place 2 sprays into the nose daily.          . norethindrone (MICRONOR,CAMILA,ERRIN) 0.35 MG tablet   Oral   Take 1 tablet by mouth daily.          Marland Kitchen topiramate (TOPAMAX) 50 MG tablet   Oral   Take 50 mg by mouth 2 (two) times daily.          BP 108/69  Pulse 71  Temp(Src) 97.7 F (36.5 C)  Ht 5' (1.524 m)  Wt 210 lb (95.255 kg)  BMI 41.01 kg/m2  SpO2 98% Filed Vitals:   04/22/13 1516 04/22/13 1833 04/22/13 1835 04/22/13 1838  BP: 137/77 110/62 124/73 108/69  Pulse: 88 82 76 71  Temp: 97.7 F (36.5 C)     Height: 5' (1.524 m)     Weight: 210 lb (95.255 kg)     SpO2: 98%       Physical Exam 1705: Physical examination:  Nursing notes reviewed; Vital signs and O2 SAT reviewed;  Constitutional: Well developed, Well nourished, Well hydrated, In no acute distress; Head:  Normocephalic, atraumatic; Eyes: EOMI, PERRL, No scleral icterus; ENMT: TM's clear bilat. +edemetous nasal turbinates bilat with clear rhinorrhea. +tender to percuss bilat maxillary and frontal sinuses. Mouth and pharynx without lesions. No tonsillar exudates. No intra-oral edema. No submandibular or sublingual edema. No hoarse voice, no drooling, no stridor. No pain with manipulation of larynx. Mouth and pharynx normal, Mucous membranes moist; Neck: Supple, Full range of motion. No meningeal signs. No lymphadenopathy; Cardiovascular: Regular rate and rhythm, No murmur, rub, or gallop; Respiratory: Breath sounds clear & equal bilaterally, No rales, rhonchi, wheezes.  Speaking full sentences with ease, Normal respiratory effort/excursion; Chest: Nontender, Movement normal; Abdomen: Soft, Nontender, Nondistended, Normal bowel sounds; Genitourinary: No CVA tenderness; Spine:  No midline CS, TS, LS tenderness. +TTP bilat hypertonic trapezius muscles;; Extremities: Pulses normal, No tenderness, No edema, No calf edema or asymmetry.; Neuro: AA&Ox3, Major CN grossly intact.  Speech clear.  Climbs on and off stretcher easily by herself. Gait steady.  No gross focal motor or sensory deficits in extremities.; Skin: Color normal, Warm, Dry.   ED Course  Procedures   EKG Interpretation   None       MDM  MDM Reviewed: previous chart, nursing note and vitals    1845:  Pt is not orthostatic during VS. Pt medicated for headache. Feels improved and wants to go home now. Will continue to tx symptomatically at this time. Dx and testing d/w pt and family.  Questions answered.  Verb understanding, agreeable to d/c home with outpt f/u.      Nicholos Johns  Burnett Harry, DO 04/25/13 0021

## 2013-05-04 ENCOUNTER — Other Ambulatory Visit: Payer: Self-pay | Admitting: Nurse Practitioner

## 2013-05-04 NOTE — Telephone Encounter (Signed)
Ok times one 

## 2013-05-17 ENCOUNTER — Ambulatory Visit (INDEPENDENT_AMBULATORY_CARE_PROVIDER_SITE_OTHER): Payer: BC Managed Care – PPO | Admitting: Family Medicine

## 2013-05-17 ENCOUNTER — Encounter: Payer: Self-pay | Admitting: Family Medicine

## 2013-05-17 DIAGNOSIS — Z23 Encounter for immunization: Secondary | ICD-10-CM

## 2013-05-17 NOTE — Progress Notes (Signed)
   Subjective:    Patient ID: Jasmin Chen, female    DOB: July 23, 1980, 32 y.o.   MRN: [id number]  HPI Patient arrives for a weight check and to discuss gastric bypass surgery. This patient seen today in regards to followup. We discussed at length dietary measures restriction calories also discussed a structured exercise program and also discuss social an MR middle aspect toward obesity. Patient is to work hard at trying to get this under control and continued try to lose weight. 20 minutes spent with patient discussing all of this. Past medical history obesity  Review of Systems Negative for abdominal pain vomiting diarrhea    Objective:   Physical Exam Lungs clear heart regular pulse normal weight is noted       Assessment & Plan:  Obesity-monthly checks for 6 months under guidelines of her insurance in order to get her approved for a gastric surgery

## 2013-06-06 ENCOUNTER — Encounter: Payer: Self-pay | Admitting: Family Medicine

## 2013-06-06 ENCOUNTER — Ambulatory Visit (INDEPENDENT_AMBULATORY_CARE_PROVIDER_SITE_OTHER): Payer: BC Managed Care – PPO | Admitting: Family Medicine

## 2013-06-06 VITALS — BP 110/80 | Temp 97.7°F | Ht 60.75 in | Wt 220.1 lb

## 2013-06-06 DIAGNOSIS — A084 Viral intestinal infection, unspecified: Secondary | ICD-10-CM

## 2013-06-06 DIAGNOSIS — A088 Other specified intestinal infections: Secondary | ICD-10-CM

## 2013-06-06 MED ORDER — HYOSCYAMINE SULFATE 0.125 MG SL SUBL: 0.1250 mg | SUBLINGUAL_TABLET | SUBLINGUAL | Status: DC | PRN

## 2013-06-06 MED ORDER — ONDANSETRON 8 MG PO TBDP: 8.0000 mg | ORAL_TABLET | Freq: Three times a day (TID) | ORAL | Status: DC | PRN

## 2013-06-06 NOTE — Patient Instructions (Signed)
Viral Gastroenteritis Viral gastroenteritis is also known as stomach flu. This condition affects the stomach and intestinal tract. It can cause sudden diarrhea and vomiting. The illness typically lasts 3 to 8 days. Most people develop an immune response that eventually gets rid of the virus. While this natural response develops, the virus can make you quite ill. CAUSES  Many different viruses can cause gastroenteritis, such as rotavirus or noroviruses. You can catch one of these viruses by consuming contaminated food or water. You may also catch a virus by sharing utensils or other personal items with an infected person or by touching a contaminated surface. SYMPTOMS  The most common symptoms are diarrhea and vomiting. These problems can cause a severe loss of body fluids (dehydration) and a body salt (electrolyte) imbalance. Other symptoms may include:  Fever.  Headache.  Fatigue.  Abdominal pain. DIAGNOSIS  Your caregiver can usually diagnose viral gastroenteritis based on your symptoms and a physical exam. A stool sample may also be taken to test for the presence of viruses or other infections. TREATMENT  This illness typically goes away on its own. Treatments are aimed at rehydration. The most serious cases of viral gastroenteritis involve vomiting so severely that you are not able to keep fluids down. In these cases, fluids must be given through an intravenous line (IV). HOME CARE INSTRUCTIONS   Drink enough fluids to keep your urine clear or pale yellow. Drink small amounts of fluids frequently and increase the amounts as tolerated.  Ask your caregiver for specific rehydration instructions.  Avoid:  Foods high in sugar.  Alcohol.  Carbonated drinks.  Tobacco.  Juice.  Caffeine drinks.  Extremely hot or cold fluids.  Fatty, greasy foods.  Too much intake of anything at one time.  Dairy products until 24 to 48 hours after diarrhea stops.  You may consume probiotics.  Probiotics are active cultures of beneficial bacteria. They may lessen the amount and number of diarrheal stools in adults. Probiotics can be found in yogurt with active cultures and in supplements.  Wash your hands well to avoid spreading the virus.  Only take over-the-counter or prescription medicines for pain, discomfort, or fever as directed by your caregiver. Do not give aspirin to children. Antidiarrheal medicines are not recommended.  Ask your caregiver if you should continue to take your regular prescribed and over-the-counter medicines.  Keep all follow-up appointments as directed by your caregiver. SEEK IMMEDIATE MEDICAL CARE IF:   You are unable to keep fluids down.  You do not urinate at least once every 6 to 8 hours.  You develop shortness of breath.  You notice blood in your stool or vomit. This may look like coffee grounds.  You have abdominal pain that increases or is concentrated in one small area (localized).  You have persistent vomiting or diarrhea.  You have a fever.  The patient is a child younger than 3 months, and he or she has a fever.  The patient is a child older than 3 months, and he or she has a fever and persistent symptoms.  The patient is a child older than 3 months, and he or she has a fever and symptoms suddenly get worse.  The patient is a baby, and he or she has no tears when crying. MAKE SURE YOU:   Understand these instructions.  Will watch your condition.  Will get help right away if you are not doing well or get worse. Document Released: 06/01/2005 Document Revised: 08/24/2011 Document Reviewed: 03/18/2011   [employer] Patient Information 2014 [employer]  

## 2013-06-06 NOTE — Progress Notes (Signed)
   Subjective:    Patient ID: Jasmin Chen, female    DOB: Mar 24, 1981, 32 y.o.   MRN: [id number]  Diarrhea  This is a new problem. The current episode started in the past 7 days. The problem has been unchanged. The stool consistency is described as watery. Associated symptoms include abdominal pain and vomiting. Nothing aggravates the symptoms. There are no known risk factors. Treatments tried: nausea medication  The treatment provided no relief.   PMH benign Tried Zofran for the nausea abd pain feels swollen Hemorrhoid acting up as well Low grade fever Feels fair   Review of Systems  Gastrointestinal: Positive for vomiting, abdominal pain and diarrhea.   Denies high fever denies bloody stools    Objective:   Physical Exam Lungs clear heart regular abdomen soft mild tenderness       Assessment & Plan:  Viral syndrome supportive measures discussed gastroenteritis should gradually get better warnings discussed

## 2013-06-09 ENCOUNTER — Telehealth: Payer: Self-pay | Admitting: Family Medicine

## 2013-06-09 NOTE — Telephone Encounter (Signed)
Was seen on 06/06/13

## 2013-06-09 NOTE — Telephone Encounter (Signed)
Discussed with patient

## 2013-06-09 NOTE — Telephone Encounter (Signed)
Patients needs something called in for hemrroid.to [facility] .

## 2013-06-09 NOTE — Telephone Encounter (Signed)
Warm sitz baths, stool softners, preperation H, if persist may need ref to gen surg

## 2013-06-14 ENCOUNTER — Emergency Department (HOSPITAL_COMMUNITY)
Admission: EM | Admit: 2013-06-14 | Discharge: 2013-06-14 | Disposition: A | Discharge status: Home / Self Care | Payer: BC Managed Care – PPO | Attending: Emergency Medicine | Admitting: Emergency Medicine

## 2013-06-14 ENCOUNTER — Encounter (HOSPITAL_COMMUNITY): Payer: Self-pay | Admitting: Emergency Medicine

## 2013-06-14 DIAGNOSIS — G43009 Migraine without aura, not intractable, without status migrainosus: Secondary | ICD-10-CM | POA: Insufficient documentation

## 2013-06-14 DIAGNOSIS — IMO0002 Reserved for concepts with insufficient information to code with codable children: Secondary | ICD-10-CM | POA: Insufficient documentation

## 2013-06-14 DIAGNOSIS — Z791 Long term (current) use of non-steroidal anti-inflammatories (NSAID): Secondary | ICD-10-CM | POA: Insufficient documentation

## 2013-06-14 DIAGNOSIS — J029 Acute pharyngitis, unspecified: Secondary | ICD-10-CM

## 2013-06-14 DIAGNOSIS — H9209 Otalgia, unspecified ear: Secondary | ICD-10-CM | POA: Insufficient documentation

## 2013-06-14 DIAGNOSIS — I10 Essential (primary) hypertension: Secondary | ICD-10-CM | POA: Insufficient documentation

## 2013-06-14 DIAGNOSIS — K589 Irritable bowel syndrome without diarrhea: Secondary | ICD-10-CM | POA: Insufficient documentation

## 2013-06-14 DIAGNOSIS — J3489 Other specified disorders of nose and nasal sinuses: Secondary | ICD-10-CM | POA: Insufficient documentation

## 2013-06-14 DIAGNOSIS — J45909 Unspecified asthma, uncomplicated: Secondary | ICD-10-CM | POA: Insufficient documentation

## 2013-06-14 DIAGNOSIS — Z872 Personal history of diseases of the skin and subcutaneous tissue: Secondary | ICD-10-CM | POA: Insufficient documentation

## 2013-06-14 DIAGNOSIS — Z8742 Personal history of other diseases of the female genital tract: Secondary | ICD-10-CM | POA: Insufficient documentation

## 2013-06-14 DIAGNOSIS — Z79899 Other long term (current) drug therapy: Secondary | ICD-10-CM | POA: Insufficient documentation

## 2013-06-14 LAB — RAPID STREP SCREEN (MED CTR MEBANE ONLY): Streptococcus, Group A Screen (Direct): NEGATIVE

## 2013-06-14 MED ORDER — HYDROCODONE-ACETAMINOPHEN 7.5-325 MG/15ML PO SOLN: 15.0000 mL | Freq: Three times a day (TID) | ORAL | Status: DC | PRN

## 2013-06-14 MED ORDER — BENZONATATE 100 MG PO CAPS: 100.0000 mg | ORAL_CAPSULE | Freq: Three times a day (TID) | ORAL | Status: DC

## 2013-06-14 MED ORDER — HYDROCODONE-ACETAMINOPHEN 7.5-325 MG/15ML PO SOLN
15.0000 mL | Freq: Once | ORAL | Status: AC
  Administered 2013-06-14: 15 mL via ORAL

## 2013-06-14 MED ORDER — NAPROXEN 500 MG PO TABS: 500.0000 mg | ORAL_TABLET | Freq: Two times a day (BID) | ORAL | Status: DC

## 2013-06-14 MED ORDER — HYDROCODONE-ACETAMINOPHEN 7.5-325 MG/15ML PO SOLN
10.0000 mL | Freq: Once | ORAL | Status: DC
  Filled 2013-06-14: qty 15

## 2013-06-14 NOTE — ED Provider Notes (Signed)
CSN: [id number]     Arrival date & time 06/14/13  0038 History   First MD Initiated Contact with Patient 06/14/13 0229     Chief Complaint  Patient presents with  . Sore Throat   (Consider location/radiation/quality/duration/timing/severity/associated sxs/prior Treatment) HPI Comments: 32 year old female, history of 2 days of gradual onset of sore throat, nasal drainage, right ear pain and a cough. This has been persistent, nothing makes it better, worse with swallowing, not associated with fevers chills or myalgias. She has no known sick contacts.  Patient is a 32 y.o. female presenting with pharyngitis. The history is provided by the patient.  Sore Throat    Past Medical History  Diagnosis Date  . Asthma   . Multiple environmental allergies   . IBS (irritable bowel syndrome)   . Chronic rhinitis   . Migraine without aura 2009  . Hidradenitis suppurativa   . Hypertension   . Other and unspecified ovarian cyst 02/06/2013   Past Surgical History  Procedure Laterality Date  . Deviated septum repair    . 2 cysts removed from arm    . Breast reduction surgery    . Abdominal hysterectomy     Family History  Problem Relation Age of Onset  . Hypertension Mother   . Diabetes Mother   . Hyperlipidemia Mother   . Hypertension Father   . Hyperlipidemia Father   . Heart attack Father   . Sarcoidosis Sister   . Thyroid disease Sister   . Hyperlipidemia Sister   . Hypertension Sister    History  Substance Use Topics  . Smoking status: Never Smoker   . Smokeless tobacco: Never Used  . Alcohol Use: No   OB History   Grav Para Term Preterm Abortions TAB SAB Ect Mult Living                 Review of Systems  All other systems reviewed and are negative.    Allergies  Prednisone  Home Medications   Current Outpatient Rx  Name  Route  Sig  Dispense  Refill  . albuterol (PROVENTIL HFA;VENTOLIN HFA) 108 (90 BASE) MCG/ACT inhaler   Inhalation   Inhale 2 puffs into the  lungs every 4 (four) hours as needed.           Marland Kitchen albuterol (PROVENTIL) (2.5 MG/3ML) 0.083% nebulizer solution   Nebulization   Take 2.5 mg by nebulization every 4 (four) hours as needed.           . ALPRAZolam (XANAX) 0.25 MG tablet      TAKE 1/2 TO 1 TABLET BY MOUTH TWICE A DAY AS NEEDED ANXIETY   30 tablet   0   . cetirizine (ZYRTEC) 10 MG tablet   Oral   Take 10 mg by mouth daily as needed for allergies.         . hyoscyamine (LEVSIN/SL) 0.125 MG SL tablet   Sublingual   Place 1 tablet (0.125 mg total) under the tongue every 4 (four) hours as needed for cramping.   30 tablet   0   . ibuprofen (ADVIL,MOTRIN) 200 MG tablet   Oral   Take 400 mg by mouth every 6 (six) hours as needed for pain.         Marland Kitchen ondansetron (ZOFRAN ODT) 8 MG disintegrating tablet   Oral   Take 1 tablet (8 mg total) by mouth every 8 (eight) hours as needed for nausea or vomiting.   20 tablet   0   .  promethazine (PHENERGAN) 25 MG tablet   Oral   Take 1 tablet (25 mg total) by mouth every 6 (six) hours as needed for nausea or vomiting (or headache).   8 tablet   0   . topiramate (TOPAMAX) 50 MG tablet   Oral   Take 50 mg by mouth 2 (two) times daily.         . benzonatate (TESSALON) 100 MG capsule   Oral   Take 1 capsule (100 mg total) by mouth every 8 (eight) hours.   21 capsule   0   . HYDROcodone-acetaminophen (HYCET) 7.5-325 mg/15 ml solution   Oral   Take 15 mLs by mouth every 8 (eight) hours as needed for moderate pain.   120 mL   0   . HYDROcodone-acetaminophen (NORCO/VICODIN) 5-325 MG per tablet   Oral   Take 1 tablet by mouth every 6 (six) hours as needed. pain         . mometasone (NASONEX) 50 MCG/ACT nasal spray   Nasal   Place 2 sprays into the nose daily.          . naproxen (NAPROSYN) 500 MG tablet   Oral   Take 1 tablet (500 mg total) by mouth 2 (two) times daily with a meal.   30 tablet   0   . norethindrone (MICRONOR,CAMILA,ERRIN) 0.35 MG  tablet   Oral   Take 1 tablet by mouth daily.           BP 115/81  Pulse 86  Temp(Src) 97.7 F (36.5 C) (Oral)  Resp 18  Ht 5' (1.524 m)  Wt 220 lb (99.791 kg)  BMI 42.97 kg/m2  SpO2 99% Physical Exam  Nursing note and vitals reviewed. Constitutional: She appears well-developed and well-nourished. No distress.  HENT:  Head: Normocephalic and atraumatic.  Mouth/Throat: Oropharynx is clear and moist. No oropharyngeal exudate.  Tympanic membranes appear normal bilaterally, pharynx is mildly erythematous, no exudate asymmetry or hypertrophy. Mucous members are moist  Eyes: Conjunctivae and EOM are normal. Pupils are equal, round, and reactive to light. Right eye exhibits no discharge. Left eye exhibits no discharge. No scleral icterus.  Neck: Normal range of motion. Neck supple. No JVD present. No thyromegaly present.  Cardiovascular: Normal rate, regular rhythm, normal heart sounds and intact distal pulses.  Exam reveals no gallop and no friction rub.   No murmur heard. Pulmonary/Chest: Effort normal and breath sounds normal. No respiratory distress. She has no wheezes. She has no rales.  Abdominal: Soft. Bowel sounds are normal. She exhibits no distension and no mass. There is no tenderness.  Musculoskeletal: Normal range of motion. She exhibits no edema and no tenderness.  Lymphadenopathy:    She has no cervical adenopathy.  Neurological: She is alert. Coordination normal.  Skin: Skin is warm and dry. No rash noted. No erythema.  Psychiatric: She has a normal mood and affect. Her behavior is normal.    ED Course  Procedures (including critical care time) Labs Review Labs Reviewed  RAPID STREP SCREEN  CULTURE, GROUP A STREP   Imaging Review No results found.  EKG Interpretation   None       MDM   1. Pharyngitis    Overall the patient appears benign, her vital signs Appear afebrile, no tachycardia, no hypotension. She was retreated with pain medications, strep  test, anticipate discharge.   Strep negative, patient well-appearing,   Meds given in ED:  Medications  HYDROcodone-acetaminophen (HYCET) 7.5-325 mg/15 ml solution 15  mL (15 mLs Oral Given 06/14/13 0427)    New Prescriptions   BENZONATATE (TESSALON) 100 MG CAPSULE    Take 1 capsule (100 mg total) by mouth every 8 (eight) hours.   HYDROCODONE-ACETAMINOPHEN (HYCET) 7.5-325 MG/15 ML SOLUTION    Take 15 mLs by mouth every 8 (eight) hours as needed for moderate pain.   NAPROXEN (NAPROSYN) 500 MG TABLET    Take 1 tablet (500 mg total) by mouth 2 (two) times daily with a meal.      Vida Roller, MD 06/14/13 [phone]

## 2013-06-14 NOTE — ED Notes (Signed)
Sorehtroat, nasal drainage, cough and right ear pain x 2 days.  Taking Ibuprofen, warm saltwater gargles without relief

## 2013-06-16 LAB — CULTURE, GROUP A STREP

## 2013-06-21 ENCOUNTER — Encounter: Payer: Self-pay | Admitting: Family Medicine

## 2013-06-21 ENCOUNTER — Ambulatory Visit (INDEPENDENT_AMBULATORY_CARE_PROVIDER_SITE_OTHER): Payer: BC Managed Care – PPO | Admitting: Family Medicine

## 2013-06-21 DIAGNOSIS — J019 Acute sinusitis, unspecified: Secondary | ICD-10-CM

## 2013-06-21 MED ORDER — LEVOFLOXACIN 500 MG PO TABS: 500.0000 mg | ORAL_TABLET | Freq: Every day | ORAL | Status: DC

## 2013-06-21 MED ORDER — FLUTICASONE PROPIONATE 50 MCG/ACT NA SUSP: 2.0000 | Freq: Every day | NASAL | Status: DC

## 2013-06-21 NOTE — Progress Notes (Signed)
   Subjective:    Patient ID: Jasmin Chen, female    DOB: Aug 09, 1980, 32 y.o.   MRN: [id number]  HPIFollow up on weight loss.   Went [facility] ED last Tuesday. Prescribed naproxen, tessalon pearls, and hycodan. Still having cough and wheezing. She does relate sinus pressure pain discomfort no shortness of breath This patient has been trying to maintain a low fat low calorie diet she's been trying to exercise.     Review of Systems  Constitutional: Negative for fever, chills, activity change and fatigue (mild).  HENT: Positive for congestion and rhinorrhea. Negative for ear pain.   Eyes: Negative for discharge.  Respiratory: Positive for cough. Negative for shortness of breath and wheezing.   Cardiovascular: Negative for chest pain.  Gastrointestinal: Negative for abdominal pain.       Objective:   Physical Exam  Nursing note and vitals reviewed. Constitutional: She appears well-developed.  HENT:  Head: Normocephalic.  Nose: Nose normal.  Mouth/Throat: Oropharynx is clear and moist. No oropharyngeal exudate.  Neck: Neck supple.  Cardiovascular: Normal rate and normal heart sounds.   No murmur heard. Pulmonary/Chest: Effort normal and breath sounds normal. She has no wheezes.  Lymphadenopathy:    She has no cervical adenopathy.  Skin: Skin is warm and dry.          Assessment & Plan:  #1 obesity-patient is trying to maintain a calorie restricted diet. She is trying to exercise multiple days a week but working full-time makes it difficult. The importance of dietary selections the importance of exercise all discussed. She will followup again in one month's time.  #2 upper rest revealed [place] with sinusitis-Levaquin 10 days along with Flonase as directed followup if problems

## 2013-06-22 ENCOUNTER — Telehealth: Payer: Self-pay | Admitting: Family Medicine

## 2013-06-22 MED ORDER — OSELTAMIVIR PHOSPHATE 75 MG PO CAPS: 75.0000 mg | ORAL_CAPSULE | Freq: Two times a day (BID) | ORAL | Status: DC

## 2013-06-22 NOTE — Telephone Encounter (Signed)
Pt calling stating that she is having sore throat, nose runny, head hurting, body aches, possible fever  Niece Larrie Kass with the flu diagnosed here,   [facility]

## 2013-06-22 NOTE — Telephone Encounter (Signed)
Med sent, patient notified.  

## 2013-06-22 NOTE — Telephone Encounter (Signed)
tamiflu 75 bid five d 

## 2013-07-02 ENCOUNTER — Encounter (HOSPITAL_COMMUNITY): Payer: Self-pay | Admitting: Emergency Medicine

## 2013-07-02 ENCOUNTER — Emergency Department (HOSPITAL_COMMUNITY)
Admission: EM | Admit: 2013-07-02 | Discharge: 2013-07-03 | Disposition: A | Discharge status: Home / Self Care | Payer: BC Managed Care – PPO | Attending: Emergency Medicine | Admitting: Emergency Medicine

## 2013-07-02 ENCOUNTER — Emergency Department (HOSPITAL_COMMUNITY)
Admission: EM | Admit: 2013-07-02 | Discharge: 2013-07-02 | Discharge status: Against Medical Advice | Payer: BC Managed Care – PPO | Attending: Emergency Medicine | Admitting: Emergency Medicine

## 2013-07-02 DIAGNOSIS — R52 Pain, unspecified: Secondary | ICD-10-CM | POA: Insufficient documentation

## 2013-07-02 DIAGNOSIS — H9209 Otalgia, unspecified ear: Secondary | ICD-10-CM | POA: Insufficient documentation

## 2013-07-02 DIAGNOSIS — Z8639 Personal history of other endocrine, nutritional and metabolic disease: Secondary | ICD-10-CM | POA: Insufficient documentation

## 2013-07-02 DIAGNOSIS — I10 Essential (primary) hypertension: Secondary | ICD-10-CM | POA: Insufficient documentation

## 2013-07-02 DIAGNOSIS — IMO0002 Reserved for concepts with insufficient information to code with codable children: Secondary | ICD-10-CM | POA: Insufficient documentation

## 2013-07-02 DIAGNOSIS — R51 Headache: Secondary | ICD-10-CM | POA: Insufficient documentation

## 2013-07-02 DIAGNOSIS — J111 Influenza due to unidentified influenza virus with other respiratory manifestations: Secondary | ICD-10-CM | POA: Insufficient documentation

## 2013-07-02 DIAGNOSIS — Z862 Personal history of diseases of the blood and blood-forming organs and certain disorders involving the immune mechanism: Secondary | ICD-10-CM | POA: Insufficient documentation

## 2013-07-02 DIAGNOSIS — IMO0001 Reserved for inherently not codable concepts without codable children: Secondary | ICD-10-CM | POA: Insufficient documentation

## 2013-07-02 DIAGNOSIS — R109 Unspecified abdominal pain: Secondary | ICD-10-CM | POA: Insufficient documentation

## 2013-07-02 DIAGNOSIS — R300 Dysuria: Secondary | ICD-10-CM | POA: Insufficient documentation

## 2013-07-02 DIAGNOSIS — R5383 Other fatigue: Secondary | ICD-10-CM

## 2013-07-02 DIAGNOSIS — J029 Acute pharyngitis, unspecified: Secondary | ICD-10-CM | POA: Insufficient documentation

## 2013-07-02 DIAGNOSIS — M2560 Stiffness of unspecified joint, not elsewhere classified: Secondary | ICD-10-CM

## 2013-07-02 DIAGNOSIS — J45909 Unspecified asthma, uncomplicated: Secondary | ICD-10-CM | POA: Insufficient documentation

## 2013-07-02 DIAGNOSIS — R5381 Other malaise: Secondary | ICD-10-CM | POA: Insufficient documentation

## 2013-07-02 DIAGNOSIS — G43909 Migraine, unspecified, not intractable, without status migrainosus: Secondary | ICD-10-CM | POA: Insufficient documentation

## 2013-07-02 DIAGNOSIS — Z79899 Other long term (current) drug therapy: Secondary | ICD-10-CM | POA: Insufficient documentation

## 2013-07-02 DIAGNOSIS — R6883 Chills (without fever): Secondary | ICD-10-CM | POA: Insufficient documentation

## 2013-07-02 DIAGNOSIS — R000 Tachycardia, unspecified: Secondary | ICD-10-CM | POA: Insufficient documentation

## 2013-07-02 DIAGNOSIS — M2569 Stiffness of other specified joint, not elsewhere classified: Secondary | ICD-10-CM | POA: Insufficient documentation

## 2013-07-02 DIAGNOSIS — Z8739 Personal history of other diseases of the musculoskeletal system and connective tissue: Secondary | ICD-10-CM | POA: Insufficient documentation

## 2013-07-02 LAB — URINALYSIS, ROUTINE W REFLEX MICROSCOPIC
Bilirubin Urine: NEGATIVE
Glucose, UA: NEGATIVE mg/dL
Hgb urine dipstick: NEGATIVE
Ketones, ur: NEGATIVE mg/dL
Leukocytes, UA: NEGATIVE
Nitrite: NEGATIVE
Protein, ur: 30 mg/dL — AB
Specific Gravity, Urine: 1.021 (ref 1.005–1.030)
Urobilinogen, UA: 1 mg/dL (ref 0.0–1.0)
pH: 8.5 — ABNORMAL HIGH (ref 5.0–8.0)

## 2013-07-02 LAB — RAPID STREP SCREEN (MED CTR MEBANE ONLY): Streptococcus, Group A Screen (Direct): NEGATIVE

## 2013-07-02 LAB — URINE MICROSCOPIC-ADD ON

## 2013-07-02 NOTE — ED Notes (Signed)
The pt has been ill for 3 weeks.  Since yesterday she has had a sore throat headache ears ache chills and pain all over her body.  She feels like she cannot get warm.  lmp none

## 2013-07-02 NOTE — ED Notes (Signed)
Pt's friend sts she is leaving and going to [facility], encouraged pt to stay and be seen multiple times, pt sts she is leaving, no distress noted

## 2013-07-02 NOTE — ED Notes (Signed)
Patient c/o chills, body aches, sore throat, ear pain, and no energy since yesterday.

## 2013-07-03 MED ORDER — HYDROCOD POLST-CHLORPHEN POLST 10-8 MG/5ML PO LQCR: 5.0000 mL | Freq: Two times a day (BID) | ORAL | Status: DC

## 2013-07-03 MED ORDER — HYDROCOD POLST-CHLORPHEN POLST 10-8 MG/5ML PO LQCR
5.0000 mL | Freq: Once | ORAL | Status: AC
Start: 2013-07-03 — End: 2013-07-03
  Administered 2013-07-03: 5 mL via ORAL
  Filled 2013-07-03: qty 5

## 2013-07-03 MED ORDER — OSELTAMIVIR PHOSPHATE 75 MG PO CAPS: 75.0000 mg | ORAL_CAPSULE | Freq: Two times a day (BID) | ORAL | Status: DC

## 2013-07-03 MED ORDER — IBUPROFEN 800 MG PO TABS
800.0000 mg | ORAL_TABLET | Freq: Once | ORAL | Status: AC
Start: 2013-07-03 — End: 2013-07-03
  Administered 2013-07-03: 800 mg via ORAL
  Filled 2013-07-03: qty 1

## 2013-07-03 MED ORDER — OSELTAMIVIR PHOSPHATE 75 MG PO CAPS
75.0000 mg | ORAL_CAPSULE | Freq: Once | ORAL | Status: AC
  Administered 2013-07-03: 75 mg via ORAL
  Filled 2013-07-03: qty 1

## 2013-07-03 NOTE — ED Provider Notes (Signed)
CSN: [id number]     Arrival date & time 07/02/13  2256 History   First MD Initiated Contact with Patient 07/02/13 2307     Chief Complaint  Patient presents with  . Influenza   (Consider location/radiation/quality/duration/timing/severity/associated sxs/prior Treatment) Patient is a 33 y.o. female presenting with flu symptoms. The history is provided by the patient.  Influenza Presenting symptoms: fatigue, headache, myalgias and sore throat   Presenting symptoms: no cough and no shortness of breath   Presenting symptoms comment:  Neck and back pain Severity:  Moderate Onset quality:  Sudden Duration:  1 day Progression:  Worsening Chronicity:  New Relieved by:  Nothing Worsened by:  Nothing tried Ineffective treatments:  OTC medications Associated symptoms: chills, decreased appetite, ear pain, nasal congestion and neck stiffness   Associated symptoms comment:  Stomach pain Risk factors: no diabetes problem     Past Medical History  Diagnosis Date  . Asthma   . Multiple environmental allergies   . IBS (irritable bowel syndrome)   . Chronic rhinitis   . Migraine without aura 2009  . Hidradenitis suppurativa   . Hypertension   . Other and unspecified ovarian cyst 02/06/2013   Past Surgical History  Procedure Laterality Date  . Deviated septum repair    . 2 cysts removed from arm    . Breast reduction surgery    . Abdominal hysterectomy     Family History  Problem Relation Age of Onset  . Hypertension Mother   . Diabetes Mother   . Hyperlipidemia Mother   . Hypertension Father   . Hyperlipidemia Father   . Heart attack Father   . Sarcoidosis Sister   . Thyroid disease Sister   . Hyperlipidemia Sister   . Hypertension Sister    History  Substance Use Topics  . Smoking status: Never Smoker   . Smokeless tobacco: Never Used  . Alcohol Use: No   OB History   Grav Para Term Preterm Abortions TAB SAB Ect Mult Living                 Review of Systems   Constitutional: Positive for chills, fatigue and decreased appetite. Negative for activity change.       All ROS Neg except as noted in HPI  HENT: Positive for congestion, ear pain and sore throat. Negative for nosebleeds.   Eyes: Negative for photophobia and discharge.  Respiratory: Negative for cough, shortness of breath and wheezing.   Cardiovascular: Negative for chest pain and palpitations.  Gastrointestinal: Positive for abdominal pain. Negative for blood in stool.  Genitourinary: Negative for dysuria, frequency and hematuria.  Musculoskeletal: Positive for myalgias, neck pain and neck stiffness. Negative for arthralgias and back pain.  Skin: Negative.   Neurological: Positive for headaches. Negative for dizziness, seizures and speech difficulty.  Psychiatric/Behavioral: Negative for hallucinations and confusion.    Allergies  Prednisone  Home Medications   Current Outpatient Rx  Name  Route  Sig  Dispense  Refill  . albuterol (PROVENTIL HFA;VENTOLIN HFA) 108 (90 BASE) MCG/ACT inhaler   Inhalation   Inhale 2 puffs into the lungs every 4 (four) hours as needed.           Marland Kitchen albuterol (PROVENTIL) (2.5 MG/3ML) 0.083% nebulizer solution   Nebulization   Take 2.5 mg by nebulization every 4 (four) hours as needed.           . ALPRAZolam (XANAX) 0.25 MG tablet      TAKE 1/2 TO 1 TABLET  BY MOUTH TWICE A DAY AS NEEDED ANXIETY   30 tablet   0   . benzonatate (TESSALON) 100 MG capsule   Oral   Take 1 capsule (100 mg total) by mouth every 8 (eight) hours.   21 capsule   0   . cetirizine (ZYRTEC) 10 MG tablet   Oral   Take 10 mg by mouth daily as needed for allergies.         . fluticasone (FLONASE) 50 MCG/ACT nasal spray   Each Nare   Place 2 sprays into both nostrils daily.   16 g   5   . HYDROcodone-acetaminophen (HYCET) 7.5-325 mg/15 ml solution   Oral   Take 15 mLs by mouth every 8 (eight) hours as needed for moderate pain.   120 mL   0   .  HYDROcodone-acetaminophen (NORCO/VICODIN) 5-325 MG per tablet   Oral   Take 1 tablet by mouth every 6 (six) hours as needed. pain         . hyoscyamine (LEVSIN/SL) 0.125 MG SL tablet   Sublingual   Place 1 tablet (0.125 mg total) under the tongue every 4 (four) hours as needed for cramping.   30 tablet   0   . ibuprofen (ADVIL,MOTRIN) 200 MG tablet   Oral   Take 400 mg by mouth every 6 (six) hours as needed for pain.         Marland Kitchen levofloxacin (LEVAQUIN) 500 MG tablet   Oral   Take 1 tablet (500 mg total) by mouth daily.   10 tablet   0   . mometasone (NASONEX) 50 MCG/ACT nasal spray   Nasal   Place 2 sprays into the nose daily.          . naproxen (NAPROSYN) 500 MG tablet   Oral   Take 1 tablet (500 mg total) by mouth 2 (two) times daily with a meal.   30 tablet   0   . ondansetron (ZOFRAN ODT) 8 MG disintegrating tablet   Oral   Take 1 tablet (8 mg total) by mouth every 8 (eight) hours as needed for nausea or vomiting.   20 tablet   0   . oseltamivir (TAMIFLU) 75 MG capsule   Oral   Take 1 capsule (75 mg total) by mouth 2 (two) times daily.   10 capsule   0   . promethazine (PHENERGAN) 25 MG tablet   Oral   Take 1 tablet (25 mg total) by mouth every 6 (six) hours as needed for nausea or vomiting (or headache).   8 tablet   0   . topiramate (TOPAMAX) 50 MG tablet   Oral   Take 50 mg by mouth 2 (two) times daily.          BP 107/70  Pulse 110  Temp(Src) 98.7 F (37.1 C) (Oral)  Resp 20  Ht 5' (1.524 m)  Wt 221 lb (100.245 kg)  BMI 43.16 kg/m2  SpO2 97% Physical Exam  Nursing note and vitals reviewed. Constitutional: She is oriented to person, place, and time. She appears well-developed and well-nourished.  Non-toxic appearance.  HENT:  Head: Normocephalic.  Right Ear: Tympanic membrane and external ear normal.  Left Ear: Tympanic membrane and external ear normal.  Mild to mod increase redness of the posterior pharynx. Mod enlargement of the  uvula.  Airway patent.  Nasal congestion present.  Eyes: EOM and lids are normal. Pupils are equal, round, and reactive to light.  Neck: Normal  range of motion. Neck supple. Carotid bruit is not present.  Cardiovascular: Regular rhythm, normal heart sounds, intact distal pulses and normal pulses.  Tachycardia present.   Pulmonary/Chest: Breath sounds normal. No stridor. No respiratory distress.  Abdominal: Soft. Bowel sounds are normal. There is no tenderness. There is no guarding.  Musculoskeletal: Normal range of motion.  Lymphadenopathy:       Head (right side): No submandibular adenopathy present.       Head (left side): No submandibular adenopathy present.    She has no cervical adenopathy.  Neurological: She is alert and oriented to person, place, and time. She has normal strength. No cranial nerve deficit or sensory deficit.  Skin: Skin is warm and dry.  Psychiatric: She has a normal mood and affect. Her speech is normal.    ED Course  Procedures (including critical care time) Labs Review Labs Reviewed - No data to display Imaging Review No results found.  EKG Interpretation   None       MDM  No diagnosis found. *I have reviewed nursing notes, vital signs, and all appropriate lab and imaging results for this patient.**  Symptoms are consistent with influenza. Rx for Tussinex and tamiflu given to the patient. Mask provided for the patient. Pt encouraged to follow up with PCP if not improving  Lenox Ahr, PA-C 07/04/13 1151

## 2013-07-03 NOTE — Discharge Instructions (Signed)

## 2013-07-04 LAB — CULTURE, GROUP A STREP

## 2013-07-07 ENCOUNTER — Ambulatory Visit (INDEPENDENT_AMBULATORY_CARE_PROVIDER_SITE_OTHER): Payer: BC Managed Care – PPO | Admitting: Family Medicine

## 2013-07-07 ENCOUNTER — Encounter: Payer: Self-pay | Admitting: Family Medicine

## 2013-07-07 VITALS — BP 122/88 | Temp 98.6°F | Ht 60.0 in | Wt 219.0 lb

## 2013-07-07 DIAGNOSIS — J329 Chronic sinusitis, unspecified: Secondary | ICD-10-CM

## 2013-07-07 MED ORDER — AMOXICILLIN-POT CLAVULANATE 875-125 MG PO TABS
1.0000 | ORAL_TABLET | Freq: Two times a day (BID) | ORAL | Status: AC
Start: 2013-07-07 — End: 2013-07-17

## 2013-07-07 MED ORDER — FIRST-DUKES MOUTHWASH MT SUSP: OROMUCOSAL | Status: AC

## 2013-07-07 NOTE — Progress Notes (Signed)
   Subjective:    Patient ID: Jasmin Chen, female    DOB: 28-Jan-1981, 33 y.o.   MRN: [id number]  [facility]. Diagnosed with flu. tamiflu and hycodan cough syrup was prescribed. Still having sore throat, headache, and cough. Pt states she has had a sore throat for over 1 month. Has had 2 strep test that were negative.   Mucus in stool.   Feels chills and achey, felt cold but had normal temp, but body flushed  Review of Systems No abdominal pain no change in bowel habits no blood in stool no rash ROS otherwise negative    Objective:   Physical Exam  Alert moderate malaise HET moderate nasal frontal congestion tightness pharynx slight erythema neck supple. Lungs clear. Heart regular in rhythm.      Assessment & Plan:  Impression sinusitis post flu discussed plan symptomatic care discussed. Antibiotics prescribed. Warning signs discussed. WSL

## 2013-07-10 NOTE — ED Provider Notes (Signed)
Medical screening examination/treatment/procedure(s) were performed by non-physician practitioner and as supervising physician I was immediately available for consultation/collaboration.  EKG Interpretation   None        Virgel Manifold, MD 07/10/13 [phone]

## 2013-07-24 ENCOUNTER — Ambulatory Visit: Payer: BC Managed Care – PPO | Admitting: Family Medicine

## 2013-07-26 ENCOUNTER — Ambulatory Visit (INDEPENDENT_AMBULATORY_CARE_PROVIDER_SITE_OTHER): Payer: BC Managed Care – PPO | Admitting: Family Medicine

## 2013-07-26 ENCOUNTER — Encounter: Payer: Self-pay | Admitting: Family Medicine

## 2013-07-26 DIAGNOSIS — L732 Hidradenitis suppurativa: Secondary | ICD-10-CM

## 2013-07-26 MED ORDER — DOXYCYCLINE HYCLATE 100 MG PO CAPS: 100.0000 mg | ORAL_CAPSULE | Freq: Two times a day (BID) | ORAL | Status: DC

## 2013-07-26 NOTE — Progress Notes (Signed)
   Subjective:    Patient ID: Jasmin Chen, female    DOB: 1981/05/11, 33 y.o.   MRN: [id number]  HPI Patient is here today for a 1 month f/u on her weight. Patient is continuing in diet continuing restriction of calories continuing regular activity she's lost a few pounds she is still on target to have surgery in several months. She also states she needs a prescription for doxycycline when she gets her reoccurring abscesses under the arms. She has a history of hidradenitis suppurative She has no concerns.   She denies any other problems  Review of Systems No chest pain shortness breath nausea vomiting diarrhea or high fevers    Objective:   Physical Exam Lungs clear hearts regular pulse normal abdomen soft obese extremities no edema       Assessment & Plan:  Obesity-dietary restrictions regular exercise at in followup again in a month's time.  Hidradenitis superlative doxycycline prescription given. If any problems and notify us for

## 2013-08-23 ENCOUNTER — Encounter: Payer: Self-pay | Admitting: Family Medicine

## 2013-08-23 ENCOUNTER — Ambulatory Visit (INDEPENDENT_AMBULATORY_CARE_PROVIDER_SITE_OTHER): Payer: BC Managed Care – PPO | Admitting: Family Medicine

## 2013-08-23 DIAGNOSIS — L259 Unspecified contact dermatitis, unspecified cause: Secondary | ICD-10-CM

## 2013-08-23 MED ORDER — TRIAMCINOLONE ACETONIDE 0.1 % EX CREA: 1.0000 "application " | TOPICAL_CREAM | Freq: Two times a day (BID) | CUTANEOUS | Status: DC

## 2013-08-23 NOTE — Progress Notes (Signed)
   Subjective:    Patient ID: Jasmin Chen, female    DOB: Sep 18, 1980, 33 y.o.   MRN: [id number]  HPI Patient is here today for 1 month f/u on her weight.   Concerned about a rash on her upper left arm. It started off as 3 little bumps a week ago. It itches and is red. She used rubbing alcohol and took Benadryl. She also has a tiny bump that is on her right hand that popped and now is a tiny black dot.      Review of Systems Denies nausea vomiting diarrhea abdominal pain she does relate itching and burning in the skin on the left arm    Objective:   Physical Exam  Lungs clear hearts regular morbid obesity noted Itching on the left arm appears to be a contact dermatitis     Assessment & Plan:  #1 morbid obesity-she is trying through diet and exercise to lose weight she is having difficult time doing so. Significant time spent with patient discussing proper diet discussing exercise try to bring her weight down 10-15 pounds she will need follow up here at for 2 additional visits then we can compile all the information and send it for 2 the specialist and they can work toward getting her approved for gastric banding  Contact dermatitis steroid cream recommended

## 2013-09-08 ENCOUNTER — Ambulatory Visit (HOSPITAL_COMMUNITY)
Admission: RE | Admit: 2013-09-08 | Discharge: 2013-09-08 | Disposition: A | Discharge status: Home / Self Care | Payer: BC Managed Care – PPO | Source: Ambulatory Visit | Attending: Family Medicine | Admitting: Family Medicine

## 2013-09-08 ENCOUNTER — Encounter: Payer: Self-pay | Admitting: Family Medicine

## 2013-09-08 ENCOUNTER — Ambulatory Visit (INDEPENDENT_AMBULATORY_CARE_PROVIDER_SITE_OTHER): Payer: BC Managed Care – PPO | Admitting: Family Medicine

## 2013-09-08 VITALS — BP 130/82 | Temp 97.7°F | Ht 60.0 in | Wt 228.8 lb

## 2013-09-08 DIAGNOSIS — N23 Unspecified renal colic: Secondary | ICD-10-CM

## 2013-09-08 DIAGNOSIS — R1032 Left lower quadrant pain: Secondary | ICD-10-CM | POA: Insufficient documentation

## 2013-09-08 DIAGNOSIS — R300 Dysuria: Secondary | ICD-10-CM

## 2013-09-08 LAB — POCT URINALYSIS DIPSTICK
Spec Grav, UA: 1.015
pH, UA: 6

## 2013-09-08 MED ORDER — TAMSULOSIN HCL 0.4 MG PO CAPS: 0.4000 mg | ORAL_CAPSULE | Freq: Every day | ORAL | Status: DC

## 2013-09-08 MED ORDER — OXYCODONE-ACETAMINOPHEN 10-325 MG PO TABS: 1.0000 | ORAL_TABLET | ORAL | Status: DC | PRN

## 2013-09-08 MED ORDER — ONDANSETRON 8 MG PO TBDP: 8.0000 mg | ORAL_TABLET | Freq: Three times a day (TID) | ORAL | Status: DC | PRN

## 2013-09-08 NOTE — Progress Notes (Signed)
   Subjective:    Patient ID: Jasmin Chen, female    DOB: 07/20/1980, 33 y.o.   MRN: [id number]  HPI  Patient arrives with complaint of left side pain for 2 days.  No nausea, vomiting or fever. Pain is constant -all the time-nothing relieves it. Patient relates constant left flank pain varies between a 8/10 all the way up to a 10 out of 10 with pain Review of Systems Patient does relate increased urgency with urination relates flank pain    Objective:   Physical Exam Lungs clear hearts regular pulse normal abdomen soft flanks moderate tenderness left side  Urinalysis normal no WBCs no RBCs       Assessment & Plan:  #1 significant flank pain probable kidney stone. KUB. Flomax daily until pain resolves. Percocet for severe pain cautioned drowsiness. Zofran as needed for nausea. Plenty of fluids. If ongoing troubles followup. If significantly worse go to [facility] long ER. Warning signs such as fevers severe pain vomiting discussed. Also if not abated by Monday or Tuesday call us we will help her set up with urology. I do not feel that the patient needs a CAT scan currently the risk of radiation does not outweigh the benefit currently

## 2013-09-10 ENCOUNTER — Emergency Department (HOSPITAL_COMMUNITY)
Admission: EM | Admit: 2013-09-10 | Discharge: 2013-09-10 | Disposition: A | Discharge status: Home / Self Care | Payer: BC Managed Care – PPO | Attending: Emergency Medicine | Admitting: Emergency Medicine

## 2013-09-10 ENCOUNTER — Emergency Department (HOSPITAL_COMMUNITY): Payer: BC Managed Care – PPO

## 2013-09-10 ENCOUNTER — Encounter (HOSPITAL_COMMUNITY): Payer: Self-pay | Admitting: Emergency Medicine

## 2013-09-10 DIAGNOSIS — Z872 Personal history of diseases of the skin and subcutaneous tissue: Secondary | ICD-10-CM | POA: Insufficient documentation

## 2013-09-10 DIAGNOSIS — Y929 Unspecified place or not applicable: Secondary | ICD-10-CM | POA: Insufficient documentation

## 2013-09-10 DIAGNOSIS — G43009 Migraine without aura, not intractable, without status migrainosus: Secondary | ICD-10-CM | POA: Insufficient documentation

## 2013-09-10 DIAGNOSIS — J45909 Unspecified asthma, uncomplicated: Secondary | ICD-10-CM | POA: Insufficient documentation

## 2013-09-10 DIAGNOSIS — W010XXA Fall on same level from slipping, tripping and stumbling without subsequent striking against object, initial encounter: Secondary | ICD-10-CM | POA: Insufficient documentation

## 2013-09-10 DIAGNOSIS — Z8742 Personal history of other diseases of the female genital tract: Secondary | ICD-10-CM | POA: Insufficient documentation

## 2013-09-10 DIAGNOSIS — I10 Essential (primary) hypertension: Secondary | ICD-10-CM | POA: Insufficient documentation

## 2013-09-10 DIAGNOSIS — Z8719 Personal history of other diseases of the digestive system: Secondary | ICD-10-CM | POA: Insufficient documentation

## 2013-09-10 DIAGNOSIS — IMO0002 Reserved for concepts with insufficient information to code with codable children: Secondary | ICD-10-CM | POA: Insufficient documentation

## 2013-09-10 DIAGNOSIS — S8391XA Sprain of unspecified site of right knee, initial encounter: Secondary | ICD-10-CM

## 2013-09-10 DIAGNOSIS — Z79899 Other long term (current) drug therapy: Secondary | ICD-10-CM | POA: Insufficient documentation

## 2013-09-10 DIAGNOSIS — Y9389 Activity, other specified: Secondary | ICD-10-CM | POA: Insufficient documentation

## 2013-09-10 MED ORDER — HYDROCODONE-ACETAMINOPHEN 5-325 MG PO TABS: 1.0000 | ORAL_TABLET | ORAL | Status: DC | PRN

## 2013-09-10 MED ORDER — IBUPROFEN 600 MG PO TABS: 600.0000 mg | ORAL_TABLET | Freq: Four times a day (QID) | ORAL | Status: DC | PRN

## 2013-09-10 NOTE — ED Provider Notes (Signed)
Medical screening examination/treatment/procedure(s) were performed by non-physician practitioner and as supervising physician I was immediately available for consultation/collaboration.   EKG Interpretation None       Nat Christen, MD 09/10/13 [phone]

## 2013-09-10 NOTE — ED Notes (Signed)
Rad called to change xray to R knee.

## 2013-09-10 NOTE — ED Notes (Signed)
Pt fell today on right knee.  C/o pain to right knee.

## 2013-09-10 NOTE — ED Provider Notes (Signed)
CSN: [id number]     Arrival date & time 09/10/13  1804 History   First MD Initiated Contact with Patient 09/10/13 1822     Chief Complaint  Patient presents with  . Fall     (Consider location/radiation/quality/duration/timing/severity/associated sxs/prior Treatment) HPI Comments: Jasmin BERNINGER is a 33 y.o. Female presenting with right knee pain which occurred when she tripped while wearing her flip-flops and falling forward landing directly on her right bent knee.  She has persistent constant pain which is worse with palpation and movement.  She is able to weight bear but with caution.  She's had no pain relievers prior to arrival.  She has been given an ice pack since arrival which has helped her pain somewhat.  She denies weakness or numbness distal to the injury site and also denies ankle and hip pain.     The history is provided by the patient.    Past Medical History  Diagnosis Date  . Asthma   . Multiple environmental allergies   . IBS (irritable bowel syndrome)   . Chronic rhinitis   . Migraine without aura 2009  . Hidradenitis suppurativa   . Hypertension   . Other and unspecified ovarian cyst 02/06/2013   Past Surgical History  Procedure Laterality Date  . Deviated septum repair    . 2 cysts removed from arm    . Breast reduction surgery    . Abdominal hysterectomy     Family History  Problem Relation Age of Onset  . Hypertension Mother   . Diabetes Mother   . Hyperlipidemia Mother   . Hypertension Father   . Hyperlipidemia Father   . Heart attack Father   . Sarcoidosis Sister   . Thyroid disease Sister   . Hyperlipidemia Sister   . Hypertension Sister    History  Substance Use Topics  . Smoking status: Never Smoker   . Smokeless tobacco: Never Used  . Alcohol Use: No   OB History   Grav Para Term Preterm Abortions TAB SAB Ect Mult Living                 Review of Systems  Constitutional: Negative for fever.  Musculoskeletal: Positive for  arthralgias. Negative for joint swelling and myalgias.  Neurological: Negative for weakness and numbness.      Allergies  Prednisone  Home Medications   Current Outpatient Rx  Name  Route  Sig  Dispense  Refill  . ALPRAZolam (XANAX) 0.25 MG tablet   Oral   Take 0.125-0.25 mg by mouth 2 (two) times daily as needed for anxiety.         . fluticasone (FLONASE) 50 MCG/ACT nasal spray   Each Nare   Place 2 sprays into both nostrils daily.          Marland Kitchen ibuprofen (ADVIL,MOTRIN) 200 MG tablet   Oral   Take 400 mg by mouth every 6 (six) hours as needed for pain.         Marland Kitchen ondansetron (ZOFRAN ODT) 8 MG disintegrating tablet   Oral   Take 1 tablet (8 mg total) by mouth every 8 (eight) hours as needed for nausea or vomiting.   20 tablet   0   . oxyCODONE-acetaminophen (PERCOCET) 10-325 MG per tablet   Oral   Take 1 tablet by mouth every 4 (four) hours as needed for pain.   30 tablet   0     For kidney stone   . tamsulosin (FLOMAX) 0.4 MG  CAPS capsule   Oral   Take 1 capsule (0.4 mg total) by mouth daily.   20 capsule   1   . topiramate (TOPAMAX) 50 MG tablet   Oral   Take 50 mg by mouth 2 (two) times daily.         Marland Kitchen triamcinolone cream (KENALOG) 0.1 %   Topical   Apply 1 application topically 2 (two) times daily.   30 g   1   . albuterol (PROVENTIL HFA;VENTOLIN HFA) 108 (90 BASE) MCG/ACT inhaler   Inhalation   Inhale 2 puffs into the lungs every 4 (four) hours as needed for wheezing or shortness of breath.          Marland Kitchen albuterol (PROVENTIL) (2.5 MG/3ML) 0.083% nebulizer solution   Nebulization   Take 2.5 mg by nebulization every 4 (four) hours as needed for wheezing or shortness of breath.          Marland Kitchen HYDROcodone-acetaminophen (NORCO/VICODIN) 5-325 MG per tablet   Oral   Take 1 tablet by mouth every 4 (four) hours as needed for moderate pain.   20 tablet   0   . ibuprofen (ADVIL,MOTRIN) 600 MG tablet   Oral   Take 1 tablet (600 mg total) by mouth  every 6 (six) hours as needed.   30 tablet   0    BP 124/76  Pulse 70  Temp(Src) 98 F (36.7 C) (Oral)  Resp 20  Ht 5' (1.524 m)  Wt 222 lb (100.699 kg)  BMI 43.36 kg/m2  SpO2 100% Physical Exam  Constitutional: She appears well-developed and well-nourished.  HENT:  Head: Atraumatic.  Neck: Normal range of motion.  Cardiovascular:  Pulses equal bilaterally  Musculoskeletal: She exhibits tenderness. She exhibits no edema.       Right knee: She exhibits decreased range of motion and bony tenderness. She exhibits no swelling, no effusion, no ecchymosis, no deformity, no laceration, no erythema, normal alignment, no LCL laxity and no MCL laxity.  Tender to palpation along anterior lateral knee joint line.  She has no effusion, no erythema or edema present.  Skin is intact.  Dorsalis pedis pulses are intact.  No pain with flexion of ankle or internal and external rotation of her right hip.  Neurological: She is alert. She has normal strength. She displays normal reflexes. No sensory deficit.  Skin: Skin is warm and dry.  Psychiatric: She has a normal mood and affect.    ED Course  Procedures (including critical care time) Labs Review Labs Reviewed - No data to display Imaging Review Dg Knee Complete 4 Views Right  09/10/2013   CLINICAL DATA:  Fall.  Injury to patella.  EXAM: RIGHT KNEE - COMPLETE 4+ VIEW  COMPARISON:  09/20/2007  FINDINGS: There is no evidence of fracture, dislocation, or joint effusion. There is no evidence of arthropathy or other focal bone abnormality. Soft tissues are unremarkable.  IMPRESSION: Negative.   Electronically Signed   By: Sherryl Barters M.D.   On: 09/10/2013 18:41     EKG Interpretation None      MDM   Final diagnoses:  Sprain of right knee    Patient was instructed in rest, ice, compression elevation.  She was given a Jones dressing, crutches.  Ibuprofen, hydrocodone.  Encouraged recheck by PCP if not improved over the next 3 days.  She  was given a work note for the next 3 days .  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   Evalee Jefferson, PA-C 09/10/13 1921

## 2013-09-10 NOTE — Discharge Instructions (Signed)
Joint Sprain A sprain is a tear or stretch in the ligaments that hold a joint together. Severe sprains may need as long as 3-6 weeks of immobilization and/or exercises to heal completely. Sprained joints should be rested and protected. If not, they can become unstable and prone to re-injury. Proper treatment can reduce your pain, shorten the period of disability, and reduce the risk of repeated injuries. TREATMENT   Rest and elevate the injured joint to reduce pain and swelling.  Apply ice packs to the injury for 20-30 minutes every 2-3 hours for the next 2-3 days.  Keep the injury wrapped in a compression bandage or splint as long as the joint is painful or as instructed by your caregiver.  Do not use the injured joint until it is completely healed to prevent re-injury and chronic instability. Follow the instructions of your caregiver.  Long-term sprain management may require exercises and/or treatment by a physical therapist. Taping or special braces may help stabilize the joint until it is completely better. SEEK MEDICAL CARE IF:   You develop increased pain or swelling of the joint.  You develop increasing redness and warmth of the joint.  You develop a fever.  It becomes stiff. Document Released: 07/09/2004 Document Revised: 08/24/2011 Document Reviewed: 06/18/2008 [facility] Patient Information 2014 [place], [place].  Wear the ace wrap and use crutches to avoid weight bearing.  Use ice and elevation as much as possible for the next several days to help reduce the swelling.  Take the medications prescribed.  You may take the hydrocodone prescribed for pain relief.  This will make you drowsy - do not drive within 4 hours of taking this medication.  Use the ibuprofen also for inflammation.  Call your doctor for a recheck this week if your pain persists as discussed.

## 2013-09-13 ENCOUNTER — Encounter: Payer: Self-pay | Admitting: Family Medicine

## 2013-09-13 ENCOUNTER — Ambulatory Visit (INDEPENDENT_AMBULATORY_CARE_PROVIDER_SITE_OTHER): Payer: BC Managed Care – PPO | Admitting: Family Medicine

## 2013-09-13 VITALS — BP 120/82 | Temp 98.5°F | Ht 60.0 in | Wt 219.0 lb

## 2013-09-13 DIAGNOSIS — M25569 Pain in unspecified knee: Secondary | ICD-10-CM

## 2013-09-13 DIAGNOSIS — M25561 Pain in right knee: Secondary | ICD-10-CM

## 2013-09-13 NOTE — Progress Notes (Signed)
   Subjective:    Patient ID: Jasmin Chen, female    DOB: 12/10/1980, 33 y.o.   MRN: [id number]  HPI Patient was seen at [facility] ER on 09/10/13 for right knee injury. Here today for a follow up visit and needs a referral to orthopedics for treatment. Patient has no other concerns at this time.  [phone]  Patient took a fall. Fell on outstretched knee. Had instant pain. Seen in emergency room.  Emergency room records reviewed. X-ray negative.  Patient continues to use crutches complains of pain deep in the knee.  Review of Systems A chest pain no back pain no abdominal pain ROS otherwise negative    Objective:   Physical Exam Alert no acute distress. Lungs clear. Heart rare rhythm. Right knee medial and patellar tenderness to deep palpation no effusion. No obvious joint laxity. No obvious joint line tenderness.       Assessment & Plan:  Impression 1 knee pain with injury. Mechanism really sounds like contusion. In this case likely bruising that this will take time to alleviate. Plan family request or so referral will do. Always chance of something more significant going on within the so we shall press on. Anti-inflammatory medicine local measures discussed hydrocodone when necessary for severe breakthrough pain. WSL

## 2013-09-18 ENCOUNTER — Telehealth: Payer: Self-pay | Admitting: Family Medicine

## 2013-09-18 NOTE — Telephone Encounter (Signed)
Calling to check on referral for her knee.

## 2013-09-19 NOTE — Telephone Encounter (Signed)
Patient called again and would like a call back today regarding the status on this referral.

## 2013-09-19 NOTE — Telephone Encounter (Signed)
Called, explained to pt referral has been sent, Dr. Ruthe Mannan office will call pt to set up, pt verbalized understanding

## 2013-09-25 ENCOUNTER — Encounter: Payer: Self-pay | Admitting: Family Medicine

## 2013-09-25 ENCOUNTER — Ambulatory Visit (INDEPENDENT_AMBULATORY_CARE_PROVIDER_SITE_OTHER): Payer: BC Managed Care – PPO | Admitting: Family Medicine

## 2013-09-25 DIAGNOSIS — E781 Pure hyperglyceridemia: Secondary | ICD-10-CM

## 2013-09-25 NOTE — Progress Notes (Signed)
   Subjective:    Patient ID: Jasmin Chen, female    DOB: [birth year], 33 y.o.   MRN: [id number]  HPI Patient is here today for her follow up visit on weight loss. Patient states that she had some bloodwork done at work and she was told that her triglycerides were high. She is concerned about this. No other concerns at this time noted.  Discussion held with patient regarding exercise diet and trying to reduce her weight.  Review of Systems She denies cough vomiting wheezing.    Objective:   Physical Exam Her lungs are clear hearts regular pulse normal extremities no edema blood pressure is good      Assessment & Plan:  Morbid obesity-patient unable to lose weight over the past month watching diet staying physically active. She needs to followup again in one month At that point it will be 7 consecutive visits. She's been counseled every month regarding diet she is also been counseled regarding exercise in the importance of trying to get her weight under control. She would benefit from weight loss surgery.  She did injure her knee in a fall she is seeing the orthopedist in the near future

## 2013-09-26 ENCOUNTER — Ambulatory Visit (INDEPENDENT_AMBULATORY_CARE_PROVIDER_SITE_OTHER): Payer: BC Managed Care – PPO | Admitting: Orthopedic Surgery

## 2013-09-26 ENCOUNTER — Encounter: Payer: Self-pay | Admitting: Orthopedic Surgery

## 2013-09-26 VITALS — Ht 60.0 in | Wt 216.0 lb

## 2013-09-26 DIAGNOSIS — S8000XA Contusion of unspecified knee, initial encounter: Secondary | ICD-10-CM

## 2013-09-26 MED ORDER — IBUPROFEN 600 MG PO TABS: 600.0000 mg | ORAL_TABLET | Freq: Four times a day (QID) | ORAL | Status: DC | PRN

## 2013-09-26 MED ORDER — HYDROCODONE-ACETAMINOPHEN 5-325 MG PO TABS: 1.0000 | ORAL_TABLET | ORAL | Status: DC | PRN

## 2013-09-26 NOTE — Patient Instructions (Signed)
Call to arrange therapy Wear brace

## 2013-09-27 ENCOUNTER — Encounter: Payer: Self-pay | Admitting: Orthopedic Surgery

## 2013-09-27 NOTE — Progress Notes (Signed)
   Subjective:    Patient ID: Jasmin Chen, female    DOB: July 26, 1980, 33 y.o.   MRN: [id number] Right knee pain   HPI Comments: Sharp throbbing 7/10 constant pain after fall in April for  Knee Pain    this is a 33 year old previously healthy female who fell in her home landing on her right knee with the knee in flexed position. She felt acute pain it persisted and she went to the emergency room. Radiographs were negative. She was given crutches ibuprofen and Norco for pain. Complains of continuous anterior knee pain    Review of Systems  Respiratory: Positive for shortness of breath and wheezing.   Gastrointestinal:       Heartburn       Objective:   Physical Exam  Vital signs: Ht 5' (1.524 m)  Wt 216 lb (97.977 kg)  BMI 42.18 kg/m2   General the patient is well-developed and well-nourished grooming and hygiene are normal Oriented x3 Mood and affect normal Ambulation: The patient is ambulatory with crutches wearing an Ace wrap partial weightbearing.  Inspection of the right knee reveals diffuse tenderness along the medial lateral joint lines anterior patellofemoral structures. The patellar tendon and quadriceps tendon are intact. There is a small joint effusion. Passive flexion is 125. Coronal and and sagittal planes stability are confirmed with appropriate test. Motor exam is intact. Skin clean dry and intact  Cardiovascular exam is normal Sensory exam normal       Assessment & Plan:  Initial films were negative  Impression contusion right knee  Recommend physical therapy, economy hinged bracing. Return in 3 weeks for reevaluation.  Meds ordered this encounter  Medications  . HYDROcodone-acetaminophen (NORCO/VICODIN) 5-325 MG per tablet    Sig: Take 1 tablet by mouth every 4 (four) hours as needed for moderate pain.    Dispense:  90 tablet    Refill:  0    Order Specific Question:  Supervising Provider    Answer:  Nat Christen [[id number]]  . ibuprofen  (ADVIL,MOTRIN) 600 MG tablet    Sig: Take 1 tablet (600 mg total) by mouth every 6 (six) hours as needed.    Dispense:  60 tablet    Refill:  5    Order Specific Question:  Supervising Provider    Answer:  Nat Christen [[id number]]

## 2013-10-03 ENCOUNTER — Encounter: Payer: Self-pay | Admitting: Family Medicine

## 2013-10-03 LAB — LIPID PANEL
Cholesterol: 193 mg/dL (ref 0–200)
HDL: 58 mg/dL (ref >39)
LDL Cholesterol: 108 mg/dL — ABNORMAL HIGH (ref 0–99)
Total CHOL/HDL Ratio: 3.3 Ratio
Triglycerides: 135 mg/dL (ref <150)
VLDL: 27 mg/dL (ref 0–40)

## 2013-10-10 ENCOUNTER — Ambulatory Visit (HOSPITAL_COMMUNITY)
Admission: RE | Admit: 2013-10-10 | Discharge: 2013-10-10 | Disposition: A | Discharge status: Home / Self Care | Payer: BC Managed Care – PPO | Source: Ambulatory Visit | Attending: Orthopedic Surgery | Admitting: Orthopedic Surgery

## 2013-10-10 DIAGNOSIS — M6281 Muscle weakness (generalized): Secondary | ICD-10-CM | POA: Insufficient documentation

## 2013-10-10 DIAGNOSIS — R29898 Other symptoms and signs involving the musculoskeletal system: Secondary | ICD-10-CM

## 2013-10-10 DIAGNOSIS — M25569 Pain in unspecified knee: Secondary | ICD-10-CM

## 2013-10-10 DIAGNOSIS — IMO0001 Reserved for inherently not codable concepts without codable children: Secondary | ICD-10-CM | POA: Insufficient documentation

## 2013-10-10 DIAGNOSIS — R262 Difficulty in walking, not elsewhere classified: Secondary | ICD-10-CM | POA: Insufficient documentation

## 2013-10-10 DIAGNOSIS — M25669 Stiffness of unspecified knee, not elsewhere classified: Secondary | ICD-10-CM

## 2013-10-10 NOTE — Evaluation (Signed)
Physical Therapy Evaluation  Patient Details  Name: Jasmin Chen MRN: [id number] Date of Birth: Oct 30, 1980  Today's Date: 10/10/2013 Time: 1350-1430 PT Time Calculation (min): 40 min  Charge:  evaluation            Visit#: 1 of 9  Re-eval: 10/31/13 Assessment Diagnosis: Rt knee contusion Prior Therapy: none  Authorization: BCBS      Past Medical History:  Past Medical History  Diagnosis Date  . Asthma   . Multiple environmental allergies   . IBS (irritable bowel syndrome)   . Chronic rhinitis   . Migraine without aura 2009  . Hidradenitis suppurativa   . Hypertension   . Other and unspecified ovarian cyst 02/06/2013   Past Surgical History:  Past Surgical History  Procedure Laterality Date  . Deviated septum repair    . 2 cysts removed from arm    . Breast reduction surgery    . Abdominal hysterectomy      Subjective Symptoms/Limitations Symptoms: Pt states she was walking into her kitchen and she fell.  She had no trouble with her knee prior to this.  This occured on 09/10/2013 and her pain is about the same.   She has pain on both the lateral and medial aspect of her knee as well as the anterior aspect.  She is currently walking with crutches taking medication as well as wearing a knee immobilizer which was given to her on 09/27/2013.   How long can you sit comfortably?: Pt has immediate pain with sitting so she straightens her knee in about a minute.  How long can you stand comfortably?: Pt is able to stand for three minutes prior to shifting all her weight onto her left leg How long can you walk comfortably?: Pt uses her crutches to walk with.  She has to walk at work but has pain doing so.   Patient Stated Goals: to be able to walk and no surgery Pain Assessment Currently in Pain?: Yes Pain Score: 5  (worst above 10/10; best 4/10) Pain Location: Knee Pain Orientation: Right Pain Type: Acute pain Pain Onset: 1 to 4 weeks ago Pain Frequency: Constant Pain  Relieving Factors: medication. Effect of Pain on Daily Activities: increases      Balance Screening Balance Screen Has the patient fallen in the past 6 months: Yes  Prior Functioning  Prior Function Vocation: Full time employment Vocation Requirements: Freight forwarder at [employer]: Hobbies-yes (Comment) Comments: walking      Sensation/Coordination/Flexibility/Functional Tests Functional Tests Functional Tests: foto 41  Assessment RLE AROM (degrees) Right Knee Extension: 0 Right Knee Flexion: 45 RLE Strength Right Hip Flexion: 3-/5 Right Hip Extension: 3+/5 Right Hip ABduction: 3-/5 Right Hip ADduction: 4/5 Right Knee Flexion:  (3-/5) Right Knee Extension: 3+/5 Right Ankle Dorsiflexion: 3+/5  Exercise/Treatments Mobility/Balance  Static Standing Balance Single Leg Stance - Right Leg: 5 Single Leg Stance - Left Leg: 60   Standing Heel Raises: 10 reps Knee Flexion: 10 reps Seated Long Arc Quad: 10 reps Heel Slides: 5 reps Supine Straight Leg Raises: 10 reps Sidelying Hip ABduction: 10 reps Hip ADduction: 10 reps Prone  Hamstring Curl: 10 reps Hip Extension: 10 reps     Physical Therapy Assessment and Plan PT Assessment and Plan Clinical Impression Statement: Pt is a 33 yo female who fell and injured her Rt knee. She has been referred to therapy to maximize her functional abiliy.  Exam demonstrates increased pain, decreased strength, decreased ROM and difficulty walking .  Pt will  benefit from skilled PT to retrun pt to previous level of functioning ,(walking without crutches and without leg brace and without pain).  Pt will benefit from skilled therapeutic intervention in order to improve on the following deficits: Difficulty walking;Abnormal gait;Decreased balance;Pain;Decreased strength;Impaired flexibility;Decreased range of motion Rehab Potential: Good PT Frequency: Min 3X/week PT Duration:  (3 weeks) PT Treatment/Interventions: Gait  training;Therapeutic activities;Therapeutic exercise;Manual techniques;Balance training   Plan:  Strengthening, stretching and balance  Goals Home Exercise Program Pt/caregiver will Perform Home Exercise Program: For increased strengthening PT Goal: Perform Home Exercise Program - Progress: Goal set today PT Short Term Goals Time to Complete Short Term Goals: 2 weeks PT Short Term Goal 1: Pt to be weaned off of crutches and knee immobilizer PT Short Term Goal 2: Pt ROM to be to 90 to allow pt to be able to sit with comfort for 20 minutes to eat a meal PT Short Term Goal 3: Pt to be able to stand with equalized weightbearing for 15 minutes to complete dishes PT Long Term Goals Time to Complete Long Term Goals:  (3 weeks) PT Long Term Goal 1: Pt to be able to sit with comfort for an hour for traveling PT Long Term Goal 2: Pt to be able to go up and down steps without difficulty Long Term Goal 3: Pt to be able to complete work activity without pain level being over 5/10 Long Term Goal 4: mm strength increased one grade to allow the above to occur PT Long Term Goal 5: ROM to 120 to allow pt to squat to pick items off the floor  Problem List Patient Active Problem List   Diagnosis Date Noted  . Knee pain 10/10/2013  . Stiffness of joint, not elsewhere classified, lower leg 10/10/2013  . Weakness of right leg 10/10/2013  . Other and unspecified ovarian cyst 03/20/2013  . Other and unspecified ovarian cyst 02/06/2013  . Other and unspecified ovarian cyst 02/06/2013  . IBS (irritable bowel syndrome) 02/03/2013  . Hidradenitis suppurativa 09/19/2012  . Morbid obesity 09/19/2012  . RECTAL BLEEDING 08/20/2010  . ABDOMINAL PAIN-LLQ 08/20/2010       GP    Leeroy Cha 10/10/2013, 4:00 PM  Physician Documentation Your signature is required to indicate approval of the treatment plan as stated above.  Please sign and either send electronically or make a copy of this report for your  files and return this physician signed original.   Please mark one 1.__approve of plan  2. ___approve of plan with the following conditions.   ______________________________                                                          _____________________ Physician Signature  Date  

## 2013-10-12 ENCOUNTER — Inpatient Hospital Stay (HOSPITAL_COMMUNITY)
Admission: RE | Admit: 2013-10-12 | Payer: BC Managed Care – PPO | Source: Ambulatory Visit | Admitting: Physical Therapy

## 2013-10-13 ENCOUNTER — Ambulatory Visit (HOSPITAL_COMMUNITY)
Admission: RE | Admit: 2013-10-13 | Discharge: 2013-10-13 | Disposition: A | Discharge status: Home / Self Care | Payer: BC Managed Care – PPO | Source: Ambulatory Visit | Attending: Family Medicine | Admitting: Family Medicine

## 2013-10-13 DIAGNOSIS — M25669 Stiffness of unspecified knee, not elsewhere classified: Secondary | ICD-10-CM

## 2013-10-13 DIAGNOSIS — R262 Difficulty in walking, not elsewhere classified: Secondary | ICD-10-CM | POA: Insufficient documentation

## 2013-10-13 DIAGNOSIS — M25569 Pain in unspecified knee: Secondary | ICD-10-CM

## 2013-10-13 DIAGNOSIS — M6281 Muscle weakness (generalized): Secondary | ICD-10-CM | POA: Insufficient documentation

## 2013-10-13 DIAGNOSIS — IMO0001 Reserved for inherently not codable concepts without codable children: Secondary | ICD-10-CM | POA: Insufficient documentation

## 2013-10-13 DIAGNOSIS — R29898 Other symptoms and signs involving the musculoskeletal system: Secondary | ICD-10-CM

## 2013-10-13 NOTE — Progress Notes (Signed)
Physical Therapy Treatment Patient Details  Name: Jasmin Chen MRN: [id number] Date of Birth: 1980/11/10  Today's Date: 10/13/2013 Time: 8546-2703 PT Time Calculation (min): 5 min Charge: TE 5009-3818, ICE 2993-7169  Visit#: 2 of 9  Re-eval: 10/31/13 Assessment Diagnosis: Rt knee contusion Next MD Visit: [name] 10/17/2013 Prior Therapy: none  Authorization: BCBS  Authorization Time Period:    Authorization Visit#:   of     Subjective: Symptoms/Limitations Symptoms: Pt states compliance with HEP without questions, curernt pain scale 5/10 Pain Assessment Currently in Pain?: Yes Pain Score: 5  Pain Location: Knee Pain Orientation: Right  Objective:   Exercise/Treatments Stretches Gastroc Stretch: 3 reps;30 seconds;Limitations Gastroc Stretch Limitations: slant board Standing Heel Raises: 10 reps;Limitations Heel Raises Limitations: Toe raises 10x Knee Flexion: 10 reps Rocker Board: 2 minutes;Limitations Rocker Board Limitations: R/L and A/P Gait Training: Gait training for heel to toe pattern and knee flexion Supine Heel Slides: Right;10 reps;Limitations Heel Slides Limitations: AROM 74 degrees flexion Straight Leg Raises: 10 reps Prone  Hamstring Curl: 10 reps Hip Extension: 10 reps   Modalities Modalities: Cryotherapy Cryotherapy Number Minutes Cryotherapy: 10 Minutes Cryotherapy Location: Knee Type of Cryotherapy: Ice pack  Physical Therapy Assessment and Plan PT Assessment and Plan Clinical Impression Statement: Began PT POC to improve gait, ROM and functional strengtening.  Cueing for heel to toe pattern and Rt knee flexion to reduce circumduction with gait.  Pt able to complete all exercises with min cueing initially for techniques without difficulty.  Improved AROM to 74 degrees flexion.  Ice applied at end of session for pain and edema control.   PT Plan: Progress PT POC to improve gait mechanics, ROM and functional strengthening towards PT POC.     Goals Home Exercise Program Pt/caregiver will Perform Home Exercise Program: For increased strengthening PT Short Term Goals Time to Complete Short Term Goals: 2 weeks PT Short Term Goal 1: Pt to be weaned off of crutches and knee immobilizer PT Short Term Goal 1 - Progress: Progressing toward goal PT Short Term Goal 2: Pt ROM to be to 90 to allow pt to be able to sit with comfort for 20 minutes to eat a meal PT Short Term Goal 2 - Progress: Progressing toward goal PT Short Term Goal 3: Pt to be able to stand with equalized weightbearing for 15 minutes to complete dishes PT Short Term Goal 3 - Progress: Progressing toward goal PT Long Term Goals Time to Complete Long Term Goals:  (3 weeks) PT Long Term Goal 1: Pt to be able to sit with comfort for an hour for traveling PT Long Term Goal 2: Pt to be able to go up and down steps without difficulty Long Term Goal 3: Pt to be able to complete work activity without pain level being over 5/10 Long Term Goal 4: mm strength increased one grade to allow the above to occur PT Long Term Goal 5: ROM to 120 to allow pt to squat to pick items off the floor  Problem List Patient Active Problem List   Diagnosis Date Noted  . Knee pain 10/10/2013  . Stiffness of joint, not elsewhere classified, lower leg 10/10/2013  . Weakness of right leg 10/10/2013  . Other and unspecified ovarian cyst 03/20/2013  . Other and unspecified ovarian cyst 02/06/2013  . Other and unspecified ovarian cyst 02/06/2013  . IBS (irritable bowel syndrome) 02/03/2013  . Hidradenitis suppurativa 09/19/2012  . Morbid obesity 09/19/2012  . RECTAL BLEEDING 08/20/2010  . ABDOMINAL PAIN-LLQ  08/20/2010    PT - End of Session Activity Tolerance: Patient tolerated treatment well General Behavior During Therapy: [facility] for tasks assessed/performed  GP    [name] 10/13/2013, 3:25 PM

## 2013-10-16 ENCOUNTER — Ambulatory Visit (HOSPITAL_COMMUNITY)
Admission: RE | Admit: 2013-10-16 | Discharge: 2013-10-16 | Disposition: A | Discharge status: Home / Self Care | Payer: BC Managed Care – PPO | Source: Ambulatory Visit | Attending: Family Medicine | Admitting: Family Medicine

## 2013-10-16 DIAGNOSIS — M25569 Pain in unspecified knee: Secondary | ICD-10-CM

## 2013-10-16 DIAGNOSIS — R29898 Other symptoms and signs involving the musculoskeletal system: Secondary | ICD-10-CM

## 2013-10-16 DIAGNOSIS — M25669 Stiffness of unspecified knee, not elsewhere classified: Secondary | ICD-10-CM

## 2013-10-16 NOTE — Progress Notes (Signed)
Physical Therapy Treatment Patient Details  Name: Jasmin Chen MRN: [id number] Date of Birth: 19-Feb-1981  Today's Date: 10/16/2013 Time: 2641-5830 PT Time Calculation (min): 53 min Charge: TE 1519-1600, Ice 9407-6808  Visit#: 3 of 9  Re-eval: 10/31/13 Assessment Diagnosis: Rt knee contusion Next MD Visit: Jasmin Chen 10/17/2013 Prior Therapy: none  Authorization: BCBS  Authorization Time Period:    Authorization Visit#:   of     Subjective: Symptoms/Limitations Symptoms: Rt knee pain scale 4/10.  Pain Assessment Currently in Pain?: Yes Pain Score: 4  Pain Location: Knee Pain Orientation: Right  Objective:   Exercise/Treatments Stretches Gastroc Stretch: 3 reps;30 seconds;Limitations Gastroc Stretch Limitations: slant board Aerobic Stationary Bike: Begin stationary bike or Nustep next session. Standing Heel Raises: 15 reps;Limitations Heel Raises Limitations: Toe raises 10x Knee Flexion: 15 reps Functional Squat: 15 reps Rocker Board: 2 minutes;Limitations Rocker Board Limitations: R/L and A/P Gait Training: Gait training for heel to toe pattern and knee flexion Other Standing Knee Exercises: weight shifting R/L Seated Other Seated Knee Exercises: 5 STS no HHA from normal chair height, no A cueing for equal knee flexion Supine Heel Slides: Right;15 reps;Limitations Heel Slides Limitations: AROM 76 degrees flexion  Modalities Modalities: Cryotherapy Cryotherapy Number Minutes Cryotherapy: 10 Minutes Cryotherapy Location: Knee Type of Cryotherapy: Ice pack  Physical Therapy Assessment and Plan PT Assessment and Plan Clinical Impression Statement: Pt progressing well towards POC. Therex focus on improving weight bearing with gait training with minimal cueing required to reduce circumduction and increase Rt knee flexion.  Added squats to POC to increase knee flexion and functioanl strengthening exercise.  Pt able to demonstrate 5 sit to stand without HHA following  vc-ing for technique.  Increased AROM 76 degrees flexion in supine.  Ice applied at end of session for pain and edema control.   PT Plan: Progress PT POC to improve gait mechanics, ROM and functional strengthening towards PT POC.  Next session begin nustep or stationary bike for ROM.  Progress to stair training following increase AROM for knee flexion.      Goals PT Short Term Goals PT Short Term Goal 1 - Progress: Progressing toward goal PT Short Term Goal 3 - Progress: Progressing toward goal  Problem List Patient Active Problem List   Diagnosis Date Noted  . Knee pain 10/10/2013  . Stiffness of joint, not elsewhere classified, lower leg 10/10/2013  . Weakness of right leg 10/10/2013  . Other and unspecified ovarian cyst 03/20/2013  . Other and unspecified ovarian cyst 02/06/2013  . Other and unspecified ovarian cyst 02/06/2013  . IBS (irritable bowel syndrome) 02/03/2013  . Hidradenitis suppurativa 09/19/2012  . Morbid obesity 09/19/2012  . RECTAL BLEEDING 08/20/2010  . ABDOMINAL PAIN-LLQ 08/20/2010    PT - End of Session Activity Tolerance: Patient tolerated treatment well General Behavior During Therapy: [facility] for tasks assessed/performed  GP    Jasmin Chen 10/16/2013, 4:08 PM

## 2013-10-17 ENCOUNTER — Encounter: Payer: Self-pay | Admitting: Orthopedic Surgery

## 2013-10-17 ENCOUNTER — Ambulatory Visit (INDEPENDENT_AMBULATORY_CARE_PROVIDER_SITE_OTHER): Payer: BC Managed Care – PPO | Admitting: Orthopedic Surgery

## 2013-10-17 VITALS — BP 135/78 | Ht 60.0 in | Wt 216.0 lb

## 2013-10-17 DIAGNOSIS — S8000XA Contusion of unspecified knee, initial encounter: Secondary | ICD-10-CM

## 2013-10-17 DIAGNOSIS — M23329 Other meniscus derangements, posterior horn of medial meniscus, unspecified knee: Secondary | ICD-10-CM

## 2013-10-17 NOTE — Progress Notes (Signed)
Patient ID: Jasmin Chen, female   DOB: 06/13/1981, 33 y.o.   MRN: [id number]  Chief Complaint  Patient presents with  . Follow-up    3 week recheck right knee contusion DOI 09/17/13    33 year old female fell and injured her right knee about 4 weeks ago. She was treated with bracing, physical therapy, anti-inflammatory and pain medication. (Ibuprofen and a narcotic)  She comes back in with improved range of motion decrease pain or persistent pain locking catching and giving way related to right knee symptoms medial joint line.  System review negative  Exam vitals are stable appearance is normal. She is oriented x3 her mood and affect are normal. She still walks with a slight limp. She has medial joint line tenderness. Her range of motion is returned to normal. She has good straight leg raise. Collateral ligaments and cruciate ligaments are stable. Her McMurray sign was positive for medial joint line pain with a click  Encounter Diagnoses  Name Primary?  . Derangement of posterior horn of medial meniscus Yes  . Contusion, knee     She has failed conservative treatment including anti-inflammatories, physical therapy and knee bracing area. We would like to proceed with arthroscopic surgery and we're going to do an MRI of her knee.

## 2013-10-17 NOTE — Patient Instructions (Addendum)
MRI Ordered [facility] note

## 2013-10-18 ENCOUNTER — Telehealth (HOSPITAL_COMMUNITY): Payer: Self-pay

## 2013-10-19 ENCOUNTER — Ambulatory Visit (HOSPITAL_COMMUNITY): Payer: BC Managed Care – PPO | Admitting: *Deleted

## 2013-10-20 ENCOUNTER — Ambulatory Visit (HOSPITAL_COMMUNITY): Payer: BC Managed Care – PPO | Admitting: Physical Therapy

## 2013-10-23 ENCOUNTER — Ambulatory Visit (INDEPENDENT_AMBULATORY_CARE_PROVIDER_SITE_OTHER): Payer: BC Managed Care – PPO | Admitting: Family Medicine

## 2013-10-23 ENCOUNTER — Encounter: Payer: Self-pay | Admitting: Family Medicine

## 2013-10-23 VITALS — BP 122/80 | Temp 98.3°F | Ht 60.0 in | Wt 222.0 lb

## 2013-10-23 DIAGNOSIS — H109 Unspecified conjunctivitis: Secondary | ICD-10-CM

## 2013-10-23 MED ORDER — AZITHROMYCIN 250 MG PO TABS: ORAL_TABLET | ORAL | Status: DC

## 2013-10-23 MED ORDER — GENTAMICIN SULFATE 0.3 % OP SOLN: 2.0000 [drp] | Freq: Four times a day (QID) | OPHTHALMIC | Status: DC

## 2013-10-23 NOTE — Progress Notes (Signed)
   Subjective:    Patient ID: Jasmin Chen, female    DOB: 1981/05/31, 33 y.o.   MRN: [id number]  Eye Pain  The left eye is affected.This is a new problem. The current episode started yesterday. Associated symptoms include an eye discharge, eye redness and itching. Treatments tried: clear eyes, benadryl. The treatment provided no relief.      Review of Systems  Eyes: Positive for pain, discharge and redness.  Skin: Positive for itching.       Objective:   Physical Exam  Alert no acute distress vitals stable lungs clear heart regular in rhythm left eye crusty gunky eyelid swollen      Assessment & Plan:  Impression acute conjunctivitis with cellulitis inflammation of eyelids plan Z-Pak plus Garamycin local measures discussed. WSL

## 2013-10-26 ENCOUNTER — Encounter: Payer: Self-pay | Admitting: Family Medicine

## 2013-10-26 ENCOUNTER — Ambulatory Visit (INDEPENDENT_AMBULATORY_CARE_PROVIDER_SITE_OTHER): Payer: BC Managed Care – PPO | Admitting: Family Medicine

## 2013-10-26 VITALS — BP 130/88 | Ht 60.0 in | Wt 220.0 lb

## 2013-10-26 DIAGNOSIS — M25569 Pain in unspecified knee: Secondary | ICD-10-CM

## 2013-10-26 DIAGNOSIS — S8000XA Contusion of unspecified knee, initial encounter: Secondary | ICD-10-CM

## 2013-10-26 DIAGNOSIS — M2550 Pain in unspecified joint: Secondary | ICD-10-CM

## 2013-10-26 DIAGNOSIS — M2559 Pain in other specified joint: Secondary | ICD-10-CM

## 2013-10-26 MED ORDER — HYDROCODONE-ACETAMINOPHEN 5-325 MG PO TABS: 1.0000 | ORAL_TABLET | Freq: Four times a day (QID) | ORAL | Status: DC | PRN

## 2013-10-26 MED ORDER — GENTAMICIN SULFATE 0.3 % OP SOLN: 2.0000 [drp] | Freq: Four times a day (QID) | OPHTHALMIC | Status: AC

## 2013-10-26 NOTE — Progress Notes (Signed)
   Subjective:    Patient ID: Jasmin Chen, female    DOB: 10-13-1980, 33 y.o.   MRN: [id number]  HPIFollow up on weight. Patient has been watching diet try to stay physically active although her knee is bothering her which prevents her from doing as much as she had been she has not been in to lose much weight. She had states that she is doing good job watching her diet.  Requesting refill on hydrocodone for knee.  This patient was seen today for chronic pain  The medication list was reviewed and updated.  Discussion was held with the patient regarding compliance with pain medication. The patient was advised the importance of maintaining medication and not using illegal substances with these. The patient was educated that we can provide 3 monthly scripts for their medication, it is their responsibility to follow the instructions. Discussion was held with the patient to make sure they're not having significant side effects. Patient is aware that pain medications are meant to minimize the severity of the pain to allow their pain levels to improve to allow for better function. They are aware of that pain medications cannot totally remove their pain.     Requesting refill on gentamicin eye drops. Both eyes having yellow drainage.  Having some allergies as well.   Review of Systems  Constitutional: Negative for fever and activity change.  HENT: Positive for congestion and rhinorrhea. Negative for ear pain.   Eyes: Negative for discharge.  Respiratory: Positive for cough. Negative for shortness of breath and wheezing.   Cardiovascular: Negative for chest pain.  Gastrointestinal: Negative for abdominal pain.       Objective:   Physical Exam  Vitals reviewed. Constitutional: She appears well-nourished. No distress.  Cardiovascular: Normal rate, regular rhythm and normal heart sounds.   No murmur heard. Pulmonary/Chest: Effort normal and breath sounds normal. No respiratory distress.    Musculoskeletal: She exhibits no edema.  Lymphadenopathy:    She has no cervical adenopathy.  Neurological: She is alert. She exhibits normal muscle tone.  Psychiatric: Her behavior is normal.          Assessment & Plan:  1. Knee pain She is following up with the specialist as MRI coming up pain medications was prescribed for her  2. Contusion, knee Pain medication prescribed she knows not to abuse it she uses it responsibly 3-4 times per day followup if ongoing troubles - HYDROcodone-acetaminophen (NORCO/VICODIN) 5-325 MG per tablet; Take 1 tablet by mouth every 6 (six) hours as needed for moderate pain.  Dispense: 100 tablet; Refill: 0  3. Morbid obesity She continues to watch diet and follow a structured diet plan she also has been following exercise plan up until her knee became injured she has not been able to exercise. Because of all of this her weight is stable. She does need to try to lose more weight. She has surgery for gastric sleeve coming up. I believe that this is medically necessary because of her morbid obesity family history of illnesses comorbid problems.   Her conjunctivitis is improving refill given use drops for 3 more days may also use allergy eyedrops.

## 2013-10-30 ENCOUNTER — Ambulatory Visit (HOSPITAL_COMMUNITY)
Admission: RE | Admit: 2013-10-30 | Discharge: 2013-10-30 | Disposition: A | Discharge status: Home / Self Care | Payer: BC Managed Care – PPO | Source: Ambulatory Visit | Attending: Orthopedic Surgery | Admitting: Orthopedic Surgery

## 2013-10-30 DIAGNOSIS — M25569 Pain in unspecified knee: Secondary | ICD-10-CM | POA: Insufficient documentation

## 2013-10-30 DIAGNOSIS — M23329 Other meniscus derangements, posterior horn of medial meniscus, unspecified knee: Secondary | ICD-10-CM

## 2013-11-08 NOTE — Progress Notes (Signed)
  Patient Details  Name: Jasmin Chen MRN: [id number] Date of Birth: [birth year]  Today's Date: 11/08/2013 Pt seen for 3/9 visits and did not return.   Last visit 10/16/2013 will discharge pt.  Leeroy Cha 11/08/2013, 12:04 PM

## 2013-11-08 NOTE — Addendum Note (Signed)
Encounter addended by: Leeroy Cha, PT on: 11/08/2013 12:05 PM<BR>     Documentation filed: Notes Section, Episodes

## 2013-11-13 ENCOUNTER — Ambulatory Visit (INDEPENDENT_AMBULATORY_CARE_PROVIDER_SITE_OTHER): Payer: BC Managed Care – PPO | Admitting: Orthopedic Surgery

## 2013-11-13 ENCOUNTER — Encounter: Payer: Self-pay | Admitting: Orthopedic Surgery

## 2013-11-13 VITALS — BP 132/88 | Ht 60.0 in | Wt 220.0 lb

## 2013-11-13 DIAGNOSIS — S8000XA Contusion of unspecified knee, initial encounter: Secondary | ICD-10-CM

## 2013-11-13 MED ORDER — HYDROCODONE-ACETAMINOPHEN 5-325 MG PO TABS: 1.0000 | ORAL_TABLET | Freq: Four times a day (QID) | ORAL | Status: DC | PRN

## 2013-11-13 NOTE — Progress Notes (Signed)
Patient ID: Jasmin Chen, female   DOB: Jul 30, 1980, 33 y.o.   MRN: [id number] Chief Complaint  Patient presents with  . Follow-up    MRI Results Right Knee    No internal derangement of the right knee.2. Mild cartilage irregularity of the medial femorotibial compartment. No focal chondral defect.  The patient's MRI shows only mild cartilage irregularity of the medial femoral tibial compartment where she is mostly tender. She denies catching locking or giving way at this time she says her knee is sore she is in a knee brace, she is taking hydrocodone 5 mg and 600 mg ibuprofen and has restricted work duties at this point  Her knee is very stable McMurray sign is negative  Recommend followup with me in 5 weeks continue medication and bracing no surgery needed

## 2013-11-15 ENCOUNTER — Other Ambulatory Visit: Payer: Self-pay | Admitting: Family Medicine

## 2013-11-15 NOTE — Telephone Encounter (Signed)
May give this and 4 refills 

## 2013-11-21 ENCOUNTER — Telehealth: Payer: Self-pay | Admitting: Family Medicine

## 2013-11-21 NOTE — Telephone Encounter (Signed)
Patient says that Dr. Nicki Reaper was supposed to draw something up about her past visits for her weight loss surgery, but she has yet to hear anything back and would like to know the status of this.

## 2013-11-21 NOTE — Telephone Encounter (Signed)
She wants to pick up

## 2013-11-21 NOTE — Telephone Encounter (Signed)
Thanks for the reminder will be putting it together over the next 24 hours. This patient wasn't to pick this up? Or does she want it faxed ( if so where)

## 2013-11-22 ENCOUNTER — Telehealth: Payer: Self-pay | Admitting: Family Medicine

## 2013-11-22 ENCOUNTER — Encounter: Payer: Self-pay | Admitting: Family Medicine

## 2013-11-22 ENCOUNTER — Other Ambulatory Visit: Payer: Self-pay | Admitting: Family Medicine

## 2013-11-22 MED ORDER — ONDANSETRON 8 MG PO TBDP: 8.0000 mg | ORAL_TABLET | Freq: Three times a day (TID) | ORAL | Status: DC | PRN

## 2013-11-22 NOTE — Telephone Encounter (Signed)
Work excuse faxed.

## 2013-11-22 NOTE — Telephone Encounter (Signed)
Zofran was sent and followup if ongoing troubles

## 2013-11-22 NOTE — Telephone Encounter (Signed)
Discussed with patient

## 2013-11-22 NOTE — Telephone Encounter (Signed)
Talk with pt , we can use zofran ( no drowsiness ) or phenergan ( drowsiness ) to help with the nausea. Also send in work excuse, review sx and reinforce small amounts of liquids on a frequent basis

## 2013-11-22 NOTE — Telephone Encounter (Signed)
Patient may have a stomach virus today. Her mom says that she has been vomiting and diarrhea all day. Wants to know if we can call something in soon for her because she is miserable. Also, patient needs a work excuse for today and tomorrow. Please fax to [facility] in [place] at [address] or [address].  [facility] [address] [place]

## 2013-11-22 NOTE — Telephone Encounter (Signed)
Discussed with mother. She would like zofran sent in.

## 2013-11-23 NOTE — Telephone Encounter (Signed)
LMOM letting patient know it was ready for pick up.

## 2013-11-23 NOTE — Telephone Encounter (Signed)
The letter has been completed. She will need a copy of every single office visit so that she can submit all of these to the specialist so that they can improve her gastric surgery

## 2013-12-19 ENCOUNTER — Encounter: Payer: Self-pay | Admitting: Orthopedic Surgery

## 2013-12-19 ENCOUNTER — Ambulatory Visit: Payer: BC Managed Care – PPO | Admitting: Orthopedic Surgery

## 2014-01-15 ENCOUNTER — Ambulatory Visit (INDEPENDENT_AMBULATORY_CARE_PROVIDER_SITE_OTHER): Payer: BC Managed Care – PPO | Admitting: Orthopedic Surgery

## 2014-01-15 ENCOUNTER — Encounter: Payer: Self-pay | Admitting: Orthopedic Surgery

## 2014-01-15 VITALS — BP 113/76 | Ht 60.0 in | Wt 220.0 lb

## 2014-01-15 DIAGNOSIS — S8000XA Contusion of unspecified knee, initial encounter: Secondary | ICD-10-CM

## 2014-01-15 DIAGNOSIS — S8001XD Contusion of right knee, subsequent encounter: Secondary | ICD-10-CM

## 2014-01-15 DIAGNOSIS — Z5189 Encounter for other specified aftercare: Secondary | ICD-10-CM

## 2014-01-15 MED ORDER — HYDROCODONE-ACETAMINOPHEN 5-325 MG PO TABS: 1.0000 | ORAL_TABLET | Freq: Four times a day (QID) | ORAL | Status: DC | PRN

## 2014-01-15 NOTE — Patient Instructions (Signed)
Work note   The patient should not climb ladders steps or kneel or squat for the next 3 months

## 2014-01-15 NOTE — Progress Notes (Signed)
Patient ID: Jasmin Chen, female   DOB: Jan 24, 1981, 33 y.o.   MRN: [id number] Chief Complaint  Patient presents with  . Follow-up    5 week recheck on right knee contusion. DOI 09-17-13.    The patient a contusion in her right knee she was treated conservatively. She improved but notices that when she goes down steps at work her knee will bother her. She says her brace no longer fits him and like a new one but she is within 1 year. So we will send her to [facility] to pay for out-of-pocket and have it measured as her thigh circumference and proximal leg area circumference do not match routine off-the-shelf bracing  She also needs refill on her pain medication  Review of systems negative  BP 113/76  Ht 5' (1.524 m)  Wt 220 lb (99.791 kg)  BMI 42.97 kg/m2 General appearance is normal, the patient is alert and oriented x3 with normal mood and affect. Stable knee muscle tone and strength normal neurovascular exam intact  Encounter Diagnosis  Name Primary?  . Contusion, knee, right, subsequent encounter Yes    Meds ordered this encounter  Medications  . HYDROcodone-acetaminophen (NORCO/VICODIN) 5-325 MG per tablet    Sig: Take 1 tablet by mouth every 6 (six) hours as needed for moderate pain.    Dispense:  40 tablet    Refill:  0    Followup as needed  Note for no climbing for 3 months

## 2014-03-11 ENCOUNTER — Emergency Department (HOSPITAL_COMMUNITY): Payer: BC Managed Care – PPO

## 2014-03-11 ENCOUNTER — Encounter (HOSPITAL_COMMUNITY): Payer: Self-pay | Admitting: Emergency Medicine

## 2014-03-11 ENCOUNTER — Observation Stay (HOSPITAL_COMMUNITY)
Admission: EM | Admit: 2014-03-11 | Discharge: 2014-03-13 | Disposition: A | Discharge status: Home / Self Care | Payer: BC Managed Care – PPO | Attending: Obstetrics and Gynecology | Admitting: Obstetrics and Gynecology

## 2014-03-11 DIAGNOSIS — Z872 Personal history of diseases of the skin and subcutaneous tissue: Secondary | ICD-10-CM | POA: Diagnosis not present

## 2014-03-11 DIAGNOSIS — K589 Irritable bowel syndrome without diarrhea: Secondary | ICD-10-CM | POA: Insufficient documentation

## 2014-03-11 DIAGNOSIS — Z79899 Other long term (current) drug therapy: Secondary | ICD-10-CM | POA: Insufficient documentation

## 2014-03-11 DIAGNOSIS — R109 Unspecified abdominal pain: Secondary | ICD-10-CM | POA: Diagnosis present

## 2014-03-11 DIAGNOSIS — N83209 Unspecified ovarian cyst, unspecified side: Secondary | ICD-10-CM | POA: Diagnosis not present

## 2014-03-11 DIAGNOSIS — R110 Nausea: Secondary | ICD-10-CM | POA: Diagnosis not present

## 2014-03-11 DIAGNOSIS — G43009 Migraine without aura, not intractable, without status migrainosus: Secondary | ICD-10-CM | POA: Insufficient documentation

## 2014-03-11 DIAGNOSIS — I10 Essential (primary) hypertension: Secondary | ICD-10-CM | POA: Diagnosis not present

## 2014-03-11 DIAGNOSIS — J45909 Unspecified asthma, uncomplicated: Secondary | ICD-10-CM | POA: Diagnosis not present

## 2014-03-11 DIAGNOSIS — R1032 Left lower quadrant pain: Secondary | ICD-10-CM

## 2014-03-11 DIAGNOSIS — G8929 Other chronic pain: Secondary | ICD-10-CM | POA: Diagnosis not present

## 2014-03-11 DIAGNOSIS — Z23 Encounter for immunization: Secondary | ICD-10-CM | POA: Insufficient documentation

## 2014-03-11 DIAGNOSIS — R1033 Periumbilical pain: Secondary | ICD-10-CM

## 2014-03-11 DIAGNOSIS — N83202 Unspecified ovarian cyst, left side: Secondary | ICD-10-CM | POA: Diagnosis present

## 2014-03-11 HISTORY — DX: Other chronic pain: G89.29

## 2014-03-11 HISTORY — DX: Other symptoms and signs involving the musculoskeletal system: R29.898

## 2014-03-11 HISTORY — DX: Unspecified ovarian cyst, unspecified side: N83.209

## 2014-03-11 HISTORY — DX: Pain in unspecified knee: M25.569

## 2014-03-11 LAB — CBC WITH DIFFERENTIAL/PLATELET
Basophils Absolute: 0 10*3/uL (ref 0.0–0.1)
Basophils Relative: 1 % (ref 0–1)
Eosinophils Absolute: 0.3 10*3/uL (ref 0.0–0.7)
Eosinophils Relative: 5 % (ref 0–5)
HCT: 38.7 % (ref 36.0–46.0)
Hemoglobin: 13.2 g/dL (ref 12.0–15.0)
Lymphocytes Relative: 48 % — ABNORMAL HIGH (ref 12–46)
Lymphs Abs: 2.8 10*3/uL (ref 0.7–4.0)
MCH: 28.3 pg (ref 26.0–34.0)
MCHC: 34.1 g/dL (ref 30.0–36.0)
MCV: 82.9 fL (ref 78.0–100.0)
Monocytes Absolute: 0.4 10*3/uL (ref 0.1–1.0)
Monocytes Relative: 6 % (ref 3–12)
Neutro Abs: 2.3 10*3/uL (ref 1.7–7.7)
Neutrophils Relative %: 40 % — ABNORMAL LOW (ref 43–77)
Platelets: 222 10*3/uL (ref 150–400)
RBC: 4.67 MIL/uL (ref 3.87–5.11)
RDW: 14.3 % (ref 11.5–15.5)
WBC: 5.8 10*3/uL (ref 4.0–10.5)

## 2014-03-11 LAB — URINALYSIS, ROUTINE W REFLEX MICROSCOPIC
Bilirubin Urine: NEGATIVE
Glucose, UA: NEGATIVE mg/dL
Hgb urine dipstick: NEGATIVE
Ketones, ur: NEGATIVE mg/dL
Leukocytes, UA: NEGATIVE
Nitrite: NEGATIVE
Protein, ur: NEGATIVE mg/dL
Specific Gravity, Urine: 1.01 (ref 1.005–1.030)
Urobilinogen, UA: 0.2 mg/dL (ref 0.0–1.0)
pH: 8.5 — ABNORMAL HIGH (ref 5.0–8.0)

## 2014-03-11 LAB — COMPREHENSIVE METABOLIC PANEL
ALT: 12 U/L (ref 0–35)
AST: 11 U/L (ref 0–37)
Albumin: 3.7 g/dL (ref 3.5–5.2)
Alkaline Phosphatase: 51 U/L (ref 39–117)
Anion gap: 9 (ref 5–15)
BUN: 7 mg/dL (ref 6–23)
CO2: 24 mEq/L (ref 19–32)
Calcium: 8.5 mg/dL (ref 8.4–10.5)
Chloride: 102 mEq/L (ref 96–112)
Creatinine, Ser: 0.49 mg/dL — ABNORMAL LOW (ref 0.50–1.10)
GFR calc Af Amer: >90 mL/min (ref >90)
GFR calc non Af Amer: >90 mL/min (ref >90)
Glucose, Bld: 100 mg/dL — ABNORMAL HIGH (ref 70–99)
Potassium: 4.2 mEq/L (ref 3.7–5.3)
Sodium: 135 mEq/L — ABNORMAL LOW (ref 137–147)
Total Bilirubin: 0.5 mg/dL (ref 0.3–1.2)
Total Protein: 7.3 g/dL (ref 6.0–8.3)

## 2014-03-11 LAB — LIPASE, BLOOD: Lipase: 25 U/L (ref 11–59)

## 2014-03-11 MED ORDER — MORPHINE SULFATE 4 MG/ML IJ SOLN
4.0000 mg | INTRAMUSCULAR | Status: AC | PRN
  Administered 2014-03-11 (×2): 4 mg via INTRAVENOUS
  Filled 2014-03-11 (×2): qty 1

## 2014-03-11 MED ORDER — PROMETHAZINE HCL 12.5 MG PO TABS: 25.0000 mg | ORAL_TABLET | Freq: Four times a day (QID) | ORAL | Status: DC | PRN

## 2014-03-11 MED ORDER — ALPRAZOLAM 0.25 MG PO TABS
0.1250 mg | ORAL_TABLET | Freq: Every evening | ORAL | Status: DC | PRN
  Administered 2014-03-11 – 2014-03-12 (×2): 0.25 mg via ORAL
  Filled 2014-03-11 (×2): qty 1

## 2014-03-11 MED ORDER — IOHEXOL 300 MG/ML  SOLN
100.0000 mL | Freq: Once | INTRAMUSCULAR | Status: AC | PRN
  Administered 2014-03-11: 100 mL via INTRAVENOUS

## 2014-03-11 MED ORDER — HYDROMORPHONE HCL 1 MG/ML IJ SOLN
1.0000 mg | INTRAMUSCULAR | Status: AC | PRN
  Administered 2014-03-12: 1 mg via INTRAVENOUS
  Filled 2014-03-11: qty 1

## 2014-03-11 MED ORDER — ALBUTEROL SULFATE (2.5 MG/3ML) 0.083% IN NEBU: 2.5000 mg | INHALATION_SOLUTION | RESPIRATORY_TRACT | Status: DC | PRN

## 2014-03-11 MED ORDER — MORPHINE SULFATE 4 MG/ML IJ SOLN
4.0000 mg | INTRAMUSCULAR | Status: AC | PRN
  Administered 2014-03-11 – 2014-03-12 (×2): 4 mg via INTRAVENOUS
  Filled 2014-03-11 (×2): qty 1

## 2014-03-11 MED ORDER — LORATADINE 10 MG PO TABS
10.0000 mg | ORAL_TABLET | Freq: Every day | ORAL | Status: DC
  Administered 2014-03-12 – 2014-03-13 (×2): 10 mg via ORAL
  Filled 2014-03-11 (×2): qty 1

## 2014-03-11 MED ORDER — PANTOPRAZOLE SODIUM 40 MG PO TBEC
40.0000 mg | DELAYED_RELEASE_TABLET | Freq: Every day | ORAL | Status: DC
  Administered 2014-03-12 – 2014-03-13 (×2): 40 mg via ORAL
  Filled 2014-03-11 (×2): qty 1

## 2014-03-11 MED ORDER — PROMETHAZINE HCL 25 MG/ML IJ SOLN
12.5000 mg | Freq: Four times a day (QID) | INTRAMUSCULAR | Status: DC | PRN
Start: 2014-03-11 — End: 2014-03-13

## 2014-03-11 MED ORDER — ONDANSETRON HCL 4 MG/2ML IJ SOLN: 4.0000 mg | INTRAMUSCULAR | Status: DC | PRN

## 2014-03-11 MED ORDER — MORPHINE SULFATE 4 MG/ML IJ SOLN: 4.0000 mg | INTRAMUSCULAR | Status: DC | PRN

## 2014-03-11 MED ORDER — ONDANSETRON HCL 4 MG/2ML IJ SOLN
4.0000 mg | Freq: Three times a day (TID) | INTRAMUSCULAR | Status: AC | PRN
  Administered 2014-03-12: 4 mg via INTRAVENOUS
  Filled 2014-03-11: qty 2

## 2014-03-11 MED ORDER — ALBUTEROL SULFATE HFA 108 (90 BASE) MCG/ACT IN AERS: 2.0000 | INHALATION_SPRAY | RESPIRATORY_TRACT | Status: DC | PRN

## 2014-03-11 MED ORDER — SODIUM CHLORIDE 0.9 % IV SOLN
INTRAVENOUS | Status: AC
  Administered 2014-03-11 – 2014-03-12 (×2): via INTRAVENOUS

## 2014-03-11 MED ORDER — ONDANSETRON HCL 4 MG/2ML IJ SOLN
4.0000 mg | INTRAMUSCULAR | Status: AC | PRN
  Administered 2014-03-11 (×2): 4 mg via INTRAVENOUS
  Filled 2014-03-11 (×2): qty 2

## 2014-03-11 MED ORDER — SODIUM CHLORIDE 0.9 % IV SOLN
INTRAVENOUS | Status: DC
  Administered 2014-03-11 – 2014-03-13 (×4): via INTRAVENOUS

## 2014-03-11 MED ORDER — IOHEXOL 300 MG/ML  SOLN
50.0000 mL | Freq: Once | INTRAMUSCULAR | Status: AC | PRN
  Administered 2014-03-11: 50 mL via ORAL

## 2014-03-11 MED ORDER — PRENATAL MULTIVITAMIN CH
1.0000 | ORAL_TABLET | Freq: Every day | ORAL | Status: DC
  Administered 2014-03-12 – 2014-03-13 (×2): 1 via ORAL
  Filled 2014-03-11 (×3): qty 1

## 2014-03-11 NOTE — ED Notes (Signed)
Pt states she has been hurting in left abdominal area x 1 week,  With diarrhea, tonight pain got much worse, severe pain, complaining of pain around belly button, pt is nausea, denies vomiting.

## 2014-03-11 NOTE — H&P (Signed)
Jasmin Chen is an 33 y.o. female. She is observed overnight to obtain u/s to r/o torsion, and for pain management after eval in ED  Tonight. CT shows a cyst on left that appears to be in the area of pain. She is s/p hysterectomy  Pertinent Gynecological History: Menses: s/p hyst Bleeding:  Contraception: status post hysterectomy DES exposure: unknown Blood transfusions: none Sexually transmitted diseases: no past history Previous GYN Procedures:   Last mammogram:  Date:  Last pap:  Date:  OB History: G, P   Menstrual History: Menarche age:  No LMP recorded. Patient has had a hysterectomy.    Past Medical History  Diagnosis Date  . Asthma   . Multiple environmental allergies   . IBS (irritable bowel syndrome)   . Chronic rhinitis   . Migraine without aura 2009  . Hidradenitis suppurativa   . Hypertension   . Other and unspecified ovarian cyst 02/06/2013  . Chronic knee pain   . Right leg weakness     chronic  . Ovarian cyst     Past Surgical History  Procedure Laterality Date  . Deviated septum repair    . 2 cysts removed from arm    . Breast reduction surgery    . Abdominal hysterectomy      Family History  Problem Relation Age of Onset  . Hypertension Mother   . Diabetes Mother   . Hyperlipidemia Mother   . Hypertension Father   . Hyperlipidemia Father   . Heart attack Father   . Sarcoidosis Sister   . Thyroid disease Sister   . Hyperlipidemia Sister   . Hypertension Sister     Social History:  reports that she has never smoked. She has never used smokeless tobacco. She reports that she does not drink alcohol or use illicit drugs.  Allergies:  Allergies  Allergen Reactions  . Prednisone Other (See Comments)    Difficulty concentrating and functioning     (Not in a hospital admission)  ROS  Blood pressure 115/75, pulse 77, temperature 97.8 F (36.6 C), temperature source Oral, resp. rate 20, height 5' (1.524 m), weight 98.431 kg (217 lb),  SpO2 99.00%. Physical Exam see note by Dr Thurnell Garbe  Results for orders placed during the hospital encounter of 03/11/14 (from the past 24 hour(s))  URINALYSIS, ROUTINE W REFLEX MICROSCOPIC     Status: Abnormal   Collection Time    03/11/14  9:09 PM      Result Value Ref Range   Color, Urine YELLOW  YELLOW   APPearance CLEAR  CLEAR   Specific Gravity, Urine 1.010  1.005 - 1.030   pH 8.5 (*) 5.0 - 8.0   Glucose, UA NEGATIVE  NEGATIVE mg/dL   Hgb urine dipstick NEGATIVE  NEGATIVE   Bilirubin Urine NEGATIVE  NEGATIVE   Ketones, ur NEGATIVE  NEGATIVE mg/dL   Protein, ur NEGATIVE  NEGATIVE mg/dL   Urobilinogen, UA 0.2  0.0 - 1.0 mg/dL   Nitrite NEGATIVE  NEGATIVE   Leukocytes, UA NEGATIVE  NEGATIVE    Ct Abdomen Pelvis W Contrast  03/11/2014   CLINICAL DATA:  Left abdominal pain and diarrhea for 1 week.  EXAM: CT ABDOMEN AND PELVIS WITH CONTRAST  TECHNIQUE: Multidetector CT imaging of the abdomen and pelvis was performed using the standard protocol following bolus administration of intravenous contrast.  CONTRAST:  156mL OMNIPAQUE IOHEXOL 300 MG/ML SOLN, 82mL OMNIPAQUE IOHEXOL 300 MG/ML SOLN  COMPARISON:  02/04/2013  FINDINGS: The lung bases are clear.  The liver, spleen, gallbladder, pancreas, adrenal glands, kidneys, abdominal aorta, inferior vena cava, and retroperitoneal lymph nodes are unremarkable. Stomach and small bowel are not abnormally distended. Stool-filled colon without distention. No free air or free fluid in the abdomen. Abdominal wall musculature appears intact.  Pelvis: Uterus appears to be surgically absent or perhaps partially resected. The left ovary is prominent with hypodense peripherally enhancing structure centrally. There is inflammatory infiltration in the anterior pelvic fat and in the low pelvis with small amount of fluid present. No loculated pelvic fluid collections. Possible mild bowel wall thickening is likely reactive. Appearance suggests pelvic inflammatory disease.  Recommend ultrasound pelvis for correlation. Appendix is normal. No destructive bone lesions.  IMPRESSION: Left ovarian enlargement and central enhancing fluid collection with infiltration in the low pelvic fat and small pelvic free fluid suggesting pelvic inflammatory disease. Recommend ultrasound for correlation.   Electronically Signed   By: Lucienne Capers M.D.   On: 03/11/2014 21:42    Assessment/Plan: Pelvic pain assoc'd with left ovary, s/p hyst. Ovarian cyst R./o torsion  overnite observation Pain mgmt,  U/s in am. NPO in a.m  Jasmin Chen V 03/11/2014, 10:15 PM

## 2014-03-11 NOTE — ED Provider Notes (Signed)
CSN: [id number]     Arrival date & time 03/11/14  1942 History   First MD Initiated Contact with Patient 03/11/14 2007     Chief Complaint  Patient presents with  . Abdominal Pain      HPI Pt was seen at 2020.  Per pt, c/o gradual onset and persistence of constant periumbilical and left sided abd "pain" for the past 1 week, worse today.  Has been associated with nausea.  Describes the abd pain as "sharp."  States the pain worsens when she has a BM. States this pain is "not like my IBS pain." Denies vomiting/diarrhea, no fevers, no back pain, no rash, no CP/SOB, no black or blood in stools, no dysuria/hematuria.        Past Medical History  Diagnosis Date  . Asthma   . Multiple environmental allergies   . IBS (irritable bowel syndrome)   . Chronic rhinitis   . Migraine without aura 2009  . Hidradenitis suppurativa   . Hypertension   . Other and unspecified ovarian cyst 02/06/2013  . Chronic knee pain   . Right leg weakness     chronic  . Ovarian cyst    Past Surgical History  Procedure Laterality Date  . Deviated septum repair    . 2 cysts removed from arm    . Breast reduction surgery    . Abdominal hysterectomy     Family History  Problem Relation Age of Onset  . Hypertension Mother   . Diabetes Mother   . Hyperlipidemia Mother   . Hypertension Father   . Hyperlipidemia Father   . Heart attack Father   . Sarcoidosis Sister   . Thyroid disease Sister   . Hyperlipidemia Sister   . Hypertension Sister    History  Substance Use Topics  . Smoking status: Never Smoker   . Smokeless tobacco: Never Used  . Alcohol Use: No    Review of Systems ROS: Statement: All systems negative except as marked or noted in the HPI; Constitutional: Negative for fever and chills. ; ; Eyes: Negative for eye pain, redness and discharge. ; ; ENMT: Negative for ear pain, hoarseness, nasal congestion, sinus pressure and sore throat. ; ; Cardiovascular: Negative for chest pain, palpitations,  diaphoresis, dyspnea and peripheral edema. ; ; Respiratory: Negative for cough, wheezing and stridor. ; ; Gastrointestinal: +nausea, abd pain. Negative for vomiting, diarrhea, blood in stool, hematemesis, jaundice and rectal bleeding. . ; ; Genitourinary: Negative for dysuria, flank pain and hematuria. ; ; Musculoskeletal: Negative for back pain and neck pain. Negative for swelling and trauma.; ; Skin: Negative for pruritus, rash, abrasions, blisters, bruising and skin lesion.; ; Neuro: Negative for headache, lightheadedness and neck stiffness. Negative for weakness, altered level of consciousness , altered mental status, extremity weakness, paresthesias, involuntary movement, seizure and syncope.      Allergies  Prednisone  Home Medications   Prior to Admission medications   Medication Sig Start Date End Date Taking? Authorizing Provider  ALPRAZolam (XANAX) 0.25 MG tablet Take 0.125-0.25 mg by mouth at bedtime as needed for anxiety.   Yes Historical Provider, MD  cetirizine (ZYRTEC) 10 MG tablet Take 10 mg by mouth daily.   Yes Historical Provider, MD  fluticasone (FLONASE) 50 MCG/ACT nasal spray Place 2 sprays into both nostrils daily.  06/21/13  Yes Historical Provider, MD  ibuprofen (ADVIL,MOTRIN) 600 MG tablet Take 1 tablet (600 mg total) by mouth every 6 (six) hours as needed. 09/26/13  Yes Carole Civil,  MD  omeprazole (PRILOSEC) 20 MG capsule Take 20 mg by mouth daily as needed (Acid Reflux).   Yes Historical Provider, MD  topiramate (TOPAMAX) 50 MG tablet Take 50 mg by mouth 2 (two) times daily.   Yes Historical Provider, MD  albuterol (PROVENTIL HFA;VENTOLIN HFA) 108 (90 BASE) MCG/ACT inhaler Inhale 2 puffs into the lungs every 4 (four) hours as needed for wheezing or shortness of breath.     Historical Provider, MD  albuterol (PROVENTIL) (2.5 MG/3ML) 0.083% nebulizer solution Take 2.5 mg by nebulization every 4 (four) hours as needed for wheezing or shortness of breath.     Historical  Provider, MD   BP 115/75  Pulse 77  Temp(Src) 97.8 F (36.6 C) (Oral)  Resp 20  Ht 5' (1.524 m)  Wt 217 lb (98.431 kg)  BMI 42.38 kg/m2  SpO2 99% Physical Exam 2025: Physical examination:  Nursing notes reviewed; Vital signs and O2 SAT reviewed;  Constitutional: Well developed, Well nourished, Well hydrated, Uncomfortable appearing.; Head:  Normocephalic, atraumatic; Eyes: EOMI, PERRL, No scleral icterus; ENMT: Mouth and pharynx normal, Mucous membranes moist; Neck: Supple, Full range of motion, No lymphadenopathy; Cardiovascular: Regular rate and rhythm, No gallop; Respiratory: Breath sounds clear & equal bilaterally, No rales, rhonchi, wheezes.  Speaking full sentences with ease, Normal respiratory effort/excursion; Chest: Nontender, Movement normal; Abdomen: Soft, +LUQ and LLQ tenderness to palp. No rebound or guarding. Nondistended, Normal bowel sounds; Genitourinary: No CVA tenderness; Extremities: Pulses normal, No tenderness, No edema, No calf edema or asymmetry.; Neuro: AA&Ox3, Major CN grossly intact.  Speech clear. No gross focal motor or sensory deficits in extremities.; Skin: Color normal, Warm, Dry.   ED Course  Procedures     MDM  MDM Reviewed: previous chart, nursing note and vitals Reviewed previous: labs Interpretation: labs and CT scan    Results for orders placed during the hospital encounter of 03/11/14  URINALYSIS, ROUTINE W REFLEX MICROSCOPIC      Result Value Ref Range   Color, Urine YELLOW  YELLOW   APPearance CLEAR  CLEAR   Specific Gravity, Urine 1.010  1.005 - 1.030   pH 8.5 (*) 5.0 - 8.0   Glucose, UA NEGATIVE  NEGATIVE mg/dL   Hgb urine dipstick NEGATIVE  NEGATIVE   Bilirubin Urine NEGATIVE  NEGATIVE   Ketones, ur NEGATIVE  NEGATIVE mg/dL   Protein, ur NEGATIVE  NEGATIVE mg/dL   Urobilinogen, UA 0.2  0.0 - 1.0 mg/dL   Nitrite NEGATIVE  NEGATIVE   Leukocytes, UA NEGATIVE  NEGATIVE   Ct Abdomen Pelvis W Contrast 03/11/2014   CLINICAL DATA:  Left  abdominal pain and diarrhea for 1 week.  EXAM: CT ABDOMEN AND PELVIS WITH CONTRAST  TECHNIQUE: Multidetector CT imaging of the abdomen and pelvis was performed using the standard protocol following bolus administration of intravenous contrast.  CONTRAST:  164mL OMNIPAQUE IOHEXOL 300 MG/ML SOLN, 49mL OMNIPAQUE IOHEXOL 300 MG/ML SOLN  COMPARISON:  02/04/2013  FINDINGS: The lung bases are clear.  The liver, spleen, gallbladder, pancreas, adrenal glands, kidneys, abdominal aorta, inferior vena cava, and retroperitoneal lymph nodes are unremarkable. Stomach and small bowel are not abnormally distended. Stool-filled colon without distention. No free air or free fluid in the abdomen. Abdominal wall musculature appears intact.  Pelvis: Uterus appears to be surgically absent or perhaps partially resected. The left ovary is prominent with hypodense peripherally enhancing structure centrally. There is inflammatory infiltration in the anterior pelvic fat and in the low pelvis with small amount of fluid present.  No loculated pelvic fluid collections. Possible mild bowel wall thickening is likely reactive. Appearance suggests pelvic inflammatory disease. Recommend ultrasound pelvis for correlation. Appendix is normal. No destructive bone lesions.  IMPRESSION: Left ovarian enlargement and central enhancing fluid collection with infiltration in the low pelvic fat and small pelvic free fluid suggesting pelvic inflammatory disease. Recommend ultrasound for correlation.   Electronically Signed   By: Lucienne Capers M.D.   On: 03/11/2014 21:42    2200:  Initial labs were hemolyzed; they have been redrawn and are pending results. Pt remains afebrile with stable VS.  Pt continues to c/o pain despite multiple doses of IV pain meds. Will re-dose and observation admit.  Dx and testing d/w pt and family.  Questions answered.  Verb understanding, agreeable to observation admit. T/C to OB/GYN Dr. Glo Herring, case discussed, including:  HPI,  pertinent PM/SHx, VS/PE, dx testing, ED course and treatment:  Aware labs were redrawn and are pending, agreeable to observation admit, no abx for now, pt will need US pelvis in the morning, requests to write temporary orders, obtain medical bed.   Francine Graven, DO 03/12/14 [phone]

## 2014-03-11 NOTE — ED Notes (Signed)
Pt in radiology 

## 2014-03-11 NOTE — ED Notes (Signed)
MD at bedside. 

## 2014-03-12 ENCOUNTER — Observation Stay (HOSPITAL_COMMUNITY): Payer: BC Managed Care – PPO

## 2014-03-12 DIAGNOSIS — N83209 Unspecified ovarian cyst, unspecified side: Secondary | ICD-10-CM

## 2014-03-12 MED ORDER — HYDROMORPHONE HCL 1 MG/ML IJ SOLN
1.0000 mg | INTRAMUSCULAR | Status: DC | PRN
  Administered 2014-03-12 – 2014-03-13 (×3): 1 mg via INTRAVENOUS
  Filled 2014-03-12 (×3): qty 1

## 2014-03-12 NOTE — Progress Notes (Signed)
UR completed 

## 2014-03-12 NOTE — Progress Notes (Signed)
Subjective: Patient reports continued pain, but is hungry, afebrile and alert, energetic..    Objective: I have reviewed patient's vital signs, medications and labs. U/s still pending  General: alert, appears stated age, mild distress and moderately obese .vs BP 124/71  Pulse 76  Temp(Src) 97.9 F (36.6 C) (Oral)  Resp 20  Ht 5' (1.524 m)  Wt 100.2 kg (220 lb 14.4 oz)  BMI 43.14 kg/m2  SpO2 100%     Assessment/Plan: Pelvic pain, left ov cyst. Plan; allow to eat. Await u/s results.  If no torsion, will consider lupron supression. And eventual OOphorectomy.   LOS: 1 day    Jasmin Chen V 03/12/2014, 11:11 AM

## 2014-03-13 LAB — URINE CULTURE: Colony Count: 25000

## 2014-03-13 MED ORDER — DOCUSATE SODIUM 100 MG PO CAPS: 100.0000 mg | ORAL_CAPSULE | Freq: Two times a day (BID) | ORAL | Status: DC | PRN

## 2014-03-13 MED ORDER — INFLUENZA VAC SPLIT QUAD 0.5 ML IM SUSY
0.5000 mL | PREFILLED_SYRINGE | Freq: Once | INTRAMUSCULAR | Status: AC
  Administered 2014-03-13: 0.5 mL via INTRAMUSCULAR
  Filled 2014-03-13: qty 0.5

## 2014-03-13 MED ORDER — PROMETHAZINE HCL 25 MG PO TABS: 25.0000 mg | ORAL_TABLET | Freq: Four times a day (QID) | ORAL | Status: DC | PRN

## 2014-03-13 MED ORDER — NORGESTIMATE-ETH ESTRADIOL 0.25-35 MG-MCG PO TABS: 1.0000 | ORAL_TABLET | Freq: Every day | ORAL | Status: DC

## 2014-03-13 MED ORDER — INFLUENZA VAC SPLIT QUAD 0.5 ML IM SUSY: 0.5000 mL | PREFILLED_SYRINGE | INTRAMUSCULAR | Status: DC

## 2014-03-13 MED ORDER — OXYCODONE-ACETAMINOPHEN 5-325 MG PO TABS: 1.0000 | ORAL_TABLET | ORAL | Status: DC | PRN

## 2014-03-13 NOTE — Discharge Summary (Signed)
Physician Discharge Summary  Patient ID: Jasmin Chen MRN: [id number] DOB/AGE: 07-16-1980 33 y.o.  Admit date: 03/11/2014 Discharge date: 03/13/2014  Admission Diagnoses: Pelvic pain, left ovarian cyst Discharge Diagnoses:  Active Problems:   Left ovarian cyst   Ovarian cyst   Discharged Condition: fair  Hospital Course: This 33 year old female was in and edematous room complaining of severe left lower quadrant pain with CT showing some heterogeneous stranding around the left ovary. She was admitted for pain control and further testing. Ultrasound was performed which showed left ovarian cyst, without evidence of torsion. She was felt adequately controlled pain by hospital day 2 she was discharged on birth control pills to suppress the ovary, a followup in one week [facility] and then again in 3 months  Consults: gynecology  Significant Diagnostic Studies: labs:  CBC    Component Value Date/Time   WBC 5.8 03/11/2014 2210   RBC 4.67 03/11/2014 2210   HGB 13.2 03/11/2014 2210   HCT 38.7 03/11/2014 2210   PLT 222 03/11/2014 2210   MCV 82.9 03/11/2014 2210   MCH 28.3 03/11/2014 2210   MCHC 34.1 03/11/2014 2210   RDW 14.3 03/11/2014 2210   LYMPHSABS 2.8 03/11/2014 2210   MONOABS 0.4 03/11/2014 2210   EOSABS 0.3 03/11/2014 2210   BASOSABS 0.0 03/11/2014 2210    CT-scan of abdomen and pelvis  Treatments: IV hydration and analgesia: Dilaudid  Discharge Exam: Blood pressure 110/76, pulse 69, temperature 97.8 F (36.6 C), temperature source Oral, resp. rate 20, height 5' (1.524 m), weight 220 lb 14.4 oz (100.2 kg), SpO2 100.00%. General appearance: mild distress Head: Normocephalic, without obvious abnormality, atraumatic GI: soft, non-tender; bowel sounds normal; no masses,  no organomegaly and No known mild discomfort without rebound lower abdomen discomfort without rebound  Disposition: 01-Home or Self Care  Discharge Instructions   Call MD for:  persistant nausea and vomiting     Complete by:  As directed      Call MD for:  severe uncontrolled pain    Complete by:  As directed      Call MD for:  temperature >100.4    Complete by:  As directed      Call MD for:    Complete by:  As directed   For a followup appointment in one week     Diet - low sodium heart healthy    Complete by:  As directed      Discharge instructions    Complete by:  As directed   We will use oral contraceptives to shut the ovary down, additionally there was a pain medications written to assess her for the next week. She will be out of work for remainder of the week, and if this pain is improved may return to work next week     Increase activity slowly    Complete by:  As directed             Medication List         albuterol (2.5 MG/3ML) 0.083% nebulizer solution  Commonly known as:  PROVENTIL  Take 2.5 mg by nebulization every 4 (four) hours as needed for wheezing or shortness of breath.     albuterol 108 (90 BASE) MCG/ACT inhaler  Commonly known as:  PROVENTIL HFA;VENTOLIN HFA  Inhale 2 puffs into the lungs every 4 (four) hours as needed for wheezing or shortness of breath.     ALPRAZolam 0.25 MG tablet  Commonly known as:  Duanne Moron  Take 0.125-0.25 mg by mouth at bedtime as needed for anxiety.     cetirizine 10 MG tablet  Commonly known as:  ZYRTEC  Take 10 mg by mouth daily.     docusate sodium 100 MG capsule  Commonly known as:  COLACE  Take 1 capsule (100 mg total) by mouth 2 (two) times daily as needed.     fluticasone 50 MCG/ACT nasal spray  Commonly known as:  FLONASE  Place 2 sprays into both nostrils daily.     ibuprofen 600 MG tablet  Commonly known as:  ADVIL,MOTRIN  Take 1 tablet (600 mg total) by mouth every 6 (six) hours as needed.     norgestimate-ethinyl estradiol 0.25-35 MG-MCG tablet  Commonly known as:  ORTHO-CYCLEN,SPRINTEC,PREVIFEM  Take 1 tablet by mouth daily.     omeprazole 20 MG capsule  Commonly known as:  PRILOSEC  Take 20 mg by mouth  daily as needed (Acid Reflux).     oxyCODONE-acetaminophen 5-325 MG per tablet  Commonly known as:  PERCOCET/ROXICET  Take 1 tablet by mouth every 4 (four) hours as needed.     promethazine 25 MG tablet  Commonly known as:  PHENERGAN  Take 1 tablet (25 mg total) by mouth every 6 (six) hours as needed for nausea.     topiramate 50 MG tablet  Commonly known as:  TOPAMAX  Take 50 mg by mouth 2 (two) times daily.        will follow up in 1 week [facility] ,begin suppression with birth control pills  Signed: Jonnie Kind 03/13/2014, 3:46 PM

## 2014-03-13 NOTE — Discharge Planning (Signed)
Pt stated she was ready to be DCd.  She was given pain meds just before DC and is being driven home by mom.  Pt will be wheeled to car by myself once dressed and ready.

## 2014-03-13 NOTE — Progress Notes (Signed)
Patient ID: Jasmin Chen, female   DOB: [birth year], 33 y.o.   MRN: [id number] Subjective: Patient reports that she had nausea once yesterday. The pain at this time is rated as 6/10. The patient does not wish to proceed with surgery at this point and is interested in a trial of ovarian suppression with oral contraceptives. Review of records shows that she would be a reasonable candidate disease hypertension or Clotting disorder so we will begin her on birth control pill, and sent home with analgesic and follow up in 1 week her office, keep her out of work this week.    Objective: I have reviewed patient's vital signs, medications and labs.  General: alert, cooperative and no distress GI: soft, non-tender; bowel sounds normal; no masses,  no organomegaly Mild lower abdominal tenderness   Assessment/Plan: Pelvic pain secondary to left ovarian cyst. No evidence of ovarian torsion. Plan discharge home followup 1 week [facility]  LOS: 2 days    Kynan Peasley V 03/13/2014, 3:39 PM

## 2014-03-14 ENCOUNTER — Encounter: Payer: Self-pay | Admitting: Obstetrics and Gynecology

## 2014-03-14 ENCOUNTER — Emergency Department (HOSPITAL_COMMUNITY)
Admission: EM | Admit: 2014-03-14 | Discharge: 2014-03-14 | Disposition: A | Discharge status: Home / Self Care | Payer: BC Managed Care – PPO | Attending: Emergency Medicine | Admitting: Emergency Medicine

## 2014-03-14 ENCOUNTER — Telehealth: Payer: Self-pay | Admitting: Obstetrics and Gynecology

## 2014-03-14 ENCOUNTER — Encounter (HOSPITAL_COMMUNITY): Payer: Self-pay | Admitting: Emergency Medicine

## 2014-03-14 DIAGNOSIS — G8929 Other chronic pain: Secondary | ICD-10-CM | POA: Diagnosis not present

## 2014-03-14 DIAGNOSIS — I10 Essential (primary) hypertension: Secondary | ICD-10-CM | POA: Diagnosis not present

## 2014-03-14 DIAGNOSIS — K589 Irritable bowel syndrome without diarrhea: Secondary | ICD-10-CM | POA: Diagnosis not present

## 2014-03-14 DIAGNOSIS — N83209 Unspecified ovarian cyst, unspecified side: Secondary | ICD-10-CM | POA: Insufficient documentation

## 2014-03-14 DIAGNOSIS — J45909 Unspecified asthma, uncomplicated: Secondary | ICD-10-CM | POA: Diagnosis not present

## 2014-03-14 DIAGNOSIS — G43909 Migraine, unspecified, not intractable, without status migrainosus: Secondary | ICD-10-CM | POA: Diagnosis not present

## 2014-03-14 DIAGNOSIS — R1032 Left lower quadrant pain: Secondary | ICD-10-CM | POA: Diagnosis present

## 2014-03-14 DIAGNOSIS — IMO0002 Reserved for concepts with insufficient information to code with codable children: Secondary | ICD-10-CM | POA: Diagnosis not present

## 2014-03-14 DIAGNOSIS — Z872 Personal history of diseases of the skin and subcutaneous tissue: Secondary | ICD-10-CM | POA: Insufficient documentation

## 2014-03-14 DIAGNOSIS — Z79899 Other long term (current) drug therapy: Secondary | ICD-10-CM | POA: Insufficient documentation

## 2014-03-14 DIAGNOSIS — N83202 Unspecified ovarian cyst, left side: Secondary | ICD-10-CM

## 2014-03-14 NOTE — ED Notes (Signed)
Pt states she needed d/c instructions immediately or she would have to leave without the. Signed pt out. E-sig did not transfer signature to chart but pt did sign.

## 2014-03-14 NOTE — ED Notes (Signed)
Pt with left abd pain that has continued, pt states she was just discharged yesterday due to ovarian cyst, denies N/V/D

## 2014-03-14 NOTE — Discharge Instructions (Signed)
Please continue your birth control pills and pain medication as prescribed. I discussed your case with Dr. Glo Herring. Lupron is an injection that may be used to help suppress the cyst but this is not a medication that we have in the emergency department it is a medicine that needs prior approval. Dr. Glo Herring can talk to you more about this on Monday when you're scheduled to see him.   Ovarian Cyst An ovarian cyst is a fluid-filled sac that forms on an ovary. The ovaries are small organs that produce eggs in women. Various types of cysts can form on the ovaries. Most are not cancerous. Many do not cause problems, and they often go away on their own. Some may cause symptoms and require treatment. Common types of ovarian cysts include:  Functional cysts--These cysts may occur every month during the menstrual cycle. This is normal. The cysts usually go away with the next menstrual cycle if the woman does not get pregnant. Usually, there are no symptoms with a functional cyst.  Endometrioma cysts--These cysts form from the tissue that lines the uterus. They are also called "chocolate cysts" because they become filled with blood that turns brown. This type of cyst can cause pain in the lower abdomen during intercourse and with your menstrual period.  Cystadenoma cysts--This type develops from the cells on the outside of the ovary. These cysts can get very big and cause lower abdomen pain and pain with intercourse. This type of cyst can twist on itself, cut off its blood supply, and cause severe pain. It can also easily rupture and cause a lot of pain.  Dermoid cysts--This type of cyst is sometimes found in both ovaries. These cysts may contain different kinds of body tissue, such as skin, teeth, hair, or cartilage. They usually do not cause symptoms unless they get very big.  Theca lutein cysts--These cysts occur when too much of a certain hormone (human chorionic gonadotropin) is produced and overstimulates  the ovaries to produce an egg. This is most common after procedures used to assist with the conception of a baby (in vitro fertilization). CAUSES   Fertility drugs can cause a condition in which multiple large cysts are formed on the ovaries. This is called ovarian hyperstimulation syndrome.  A condition called polycystic ovary syndrome can cause hormonal imbalances that can lead to nonfunctional ovarian cysts. SIGNS AND SYMPTOMS  Many ovarian cysts do not cause symptoms. If symptoms are present, they may include:  Pelvic pain or pressure.  Pain in the lower abdomen.  Pain during sexual intercourse.  Increasing girth (swelling) of the abdomen.  Abnormal menstrual periods.  Increasing pain with menstrual periods.  Stopping having menstrual periods without being pregnant. DIAGNOSIS  These cysts are commonly found during a routine or annual pelvic exam. Tests may be ordered to find out more about the cyst. These tests may include:  Ultrasound.  X-ray of the pelvis.  CT scan.  MRI.  Blood tests. TREATMENT  Many ovarian cysts go away on their own without treatment. Your health care provider may want to check your cyst regularly for 2-3 months to see if it changes. For women in menopause, it is particularly important to monitor a cyst closely because of the higher rate of ovarian cancer in menopausal women. When treatment is needed, it may include any of the following:  A procedure to drain the cyst (aspiration). This may be done using a long needle and ultrasound. It can also be done through a laparoscopic procedure.  This involves using a thin, lighted tube with a tiny camera on the end (laparoscope) inserted through a small incision.  Surgery to remove the whole cyst. This may be done using laparoscopic surgery or an open surgery involving a larger incision in the lower abdomen.  Hormone treatment or birth control pills. These methods are sometimes used to help dissolve a  cyst. HOME CARE INSTRUCTIONS   Only take over-the-counter or prescription medicines as directed by your health care provider.  Follow up with your health care provider as directed.  Get regular pelvic exams and Pap tests. SEEK MEDICAL CARE IF:   Your periods are late, irregular, or painful, or they stop.  Your pelvic pain or abdominal pain does not go away.  Your abdomen becomes larger or swollen.  You have pressure on your bladder or trouble emptying your bladder completely.  You have pain during sexual intercourse.  You have feelings of fullness, pressure, or discomfort in your stomach.  You lose weight for no apparent reason.  You feel generally ill.  You become constipated.  You lose your appetite.  You develop acne.  You have an increase in body and facial hair.  You are gaining weight, without changing your exercise and eating habits.  You think you are pregnant. SEEK IMMEDIATE MEDICAL CARE IF:   You have increasing abdominal pain.  You feel sick to your stomach (nauseous), and you throw up (vomit).  You develop a fever that comes on suddenly.  You have abdominal pain during a bowel movement.  Your menstrual periods become heavier than usual. MAKE SURE YOU:  Understand these instructions.  Will watch your condition.  Will get help right away if you are not doing well or get worse. Document Released: 06/01/2005 Document Revised: 06/06/2013 Document Reviewed: 02/06/2013 [facility] Patient Information 2015 [place], [place]. This information is not intended to replace advice given to you by your health care provider. Make sure you discuss any questions you have with your health care provider.

## 2014-03-14 NOTE — Telephone Encounter (Signed)
Pt's mother was advised per Dr.Ferguson that he could not do that today and that the pt would have to make an appointment. Pt's mother showed up at the office wanting a copy of her daughter's work note and wanted her daughter to be worked in for the shot. The mother was advised again per Dr. Glo Herring that she could not be seen for the injection today and that if her pain was that bad she would need to go to the ED for evaluation. Dr. Glo Herring wanted to make sure the pt had an appointment at the beginning of next week. The pt has an appointment Monday with Dr. Glo Herring.

## 2014-03-14 NOTE — ED Provider Notes (Signed)
This chart was scribed for [place], DO by Lowella Petties, ED Scribe. The patient was seen in room APA09/APA09. Patient's care was started at 5:35 PM.  CHIEF COMPLAINT: Abdominal Pain  HPI: HPI Comments: Jasmin Chen is a 33 y.o. female with history of asthma, IBS, migraines, hypertension, recent diagnosis of a left ovarian cyst who presents to the Emergency Department complaining of severe, constant, sharp, abdominal pain for several days. She reports that she was recently admitted for pain control. She had an ultrasound that ruled out a ovarian torsion. The plan was to try oral contraceptives for suppression and if this did not work to consider a Lupron injection. If medical management was not enough, plan was for a hysterectomy. She denies nausea, vomiting, or diarrhea. No fevers or chills. She has a history of hysterectomy. She denies vaginal bleeding or discharge. She has an appointment scheduled with Dr. Glo Herring in 5 days. States that her pain is not any different today. He is being controlled with Percocet. She is here because she was trying to get in to see Dr. Glo Herring in the office today and was unable to. She is here because she wants a Lupron injection.  ROS: See HPI Constitutional: no fever  Eyes: no drainage  ENT: no runny nose   Cardiovascular:  no chest pain  Resp: no SOB  GI: no vomiting; abdominal pain GU: no dysuria Integumentary: no rash  Allergy: no hives  Musculoskeletal: no leg swelling  Neurological: no slurred speech ROS otherwise negative  PAST MEDICAL HISTORY/PAST SURGICAL HISTORY:  Past Medical History  Diagnosis Date  . Asthma   . Multiple environmental allergies   . IBS (irritable bowel syndrome)   . Chronic rhinitis   . Migraine without aura 2009  . Hidradenitis suppurativa   . Hypertension   . Other and unspecified ovarian cyst 02/06/2013  . Chronic knee pain   . Right leg weakness     chronic  . Ovarian cyst     MEDICATIONS:  Prior to  Admission medications   Medication Sig Start Date End Date Taking? Authorizing Provider  albuterol (PROVENTIL HFA;VENTOLIN HFA) 108 (90 BASE) MCG/ACT inhaler Inhale 2 puffs into the lungs every 4 (four) hours as needed for wheezing or shortness of breath.     Historical Provider, MD  albuterol (PROVENTIL) (2.5 MG/3ML) 0.083% nebulizer solution Take 2.5 mg by nebulization every 4 (four) hours as needed for wheezing or shortness of breath.     Historical Provider, MD  ALPRAZolam Duanne Moron) 0.25 MG tablet Take 0.125-0.25 mg by mouth at bedtime as needed for anxiety.    Historical Provider, MD  cetirizine (ZYRTEC) 10 MG tablet Take 10 mg by mouth daily.    Historical Provider, MD  docusate sodium (COLACE) 100 MG capsule Take 1 capsule (100 mg total) by mouth 2 (two) times daily as needed. 03/13/14   Jonnie Kind, MD  fluticasone (FLONASE) 50 MCG/ACT nasal spray Place 2 sprays into both nostrils daily.  06/21/13   Historical Provider, MD  ibuprofen (ADVIL,MOTRIN) 600 MG tablet Take 1 tablet (600 mg total) by mouth every 6 (six) hours as needed. 09/26/13   Carole Civil, MD  norgestimate-ethinyl estradiol (ORTHO-CYCLEN,SPRINTEC,PREVIFEM) 0.25-35 MG-MCG tablet Take 1 tablet by mouth daily. 03/13/14   Jonnie Kind, MD  omeprazole (PRILOSEC) 20 MG capsule Take 20 mg by mouth daily as needed (Acid Reflux).    Historical Provider, MD  oxyCODONE-acetaminophen (PERCOCET/ROXICET) 5-325 MG per tablet Take 1 tablet by mouth  every 4 (four) hours as needed. 03/13/14   Jonnie Kind, MD  promethazine (PHENERGAN) 25 MG tablet Take 1 tablet (25 mg total) by mouth every 6 (six) hours as needed for nausea. 03/13/14   Jonnie Kind, MD  topiramate (TOPAMAX) 50 MG tablet Take 50 mg by mouth 2 (two) times daily.    Historical Provider, MD    ALLERGIES:  Allergies  Allergen Reactions  . Prednisone Other (See Comments)    Difficulty concentrating and functioning    SOCIAL HISTORY:  History  Substance Use Topics   . Smoking status: Never Smoker   . Smokeless tobacco: Never Used  . Alcohol Use: No    FAMILY HISTORY: Family History  Problem Relation Age of Onset  . Hypertension Mother   . Diabetes Mother   . Hyperlipidemia Mother   . Hypertension Father   . Hyperlipidemia Father   . Heart attack Father   . Sarcoidosis Sister   . Thyroid disease Sister   . Hyperlipidemia Sister   . Hypertension Sister     EXAM: Triage Vitals: BP 139/77  Pulse 67  Temp(Src) 98.2 F (36.8 C) (Oral)  Resp 16  Ht 5' (1.524 m)  Wt 217 lb (98.431 kg)  BMI 42.38 kg/m2  SpO2 100% CONSTITUTIONAL: Alert and oriented and responds appropriately to questions. Well-appearing; well-nourished HEAD: Normocephalic EYES: Conjunctivae clear, PERRL ENT: normal nose; no rhinorrhea; moist mucous membranes; pharynx without lesions noted NECK: Supple, no meningismus, no LAD  CARD: RRR; S1 and S2 appreciated; no murmurs, no clicks, no rubs, no gallops RESP: Normal chest excursion without splinting or tachypnea; breath sounds clear and equal bilaterally; no wheezes, no rhonchi, no rales,  ABD/GI: Normal bowel sounds; non-distended; soft, mildly tender in the LLQ, no rebound, no guarding, no peritoneal signs BACK:  The back appears normal and is non-tender to palpation, there is no CVA tenderness EXT: Normal ROM in all joints; non-tender to palpation; no edema; normal capillary refill; no cyanosis    SKIN: Normal color for age and race; warm NEURO: Moves all extremities equally PSYCH: The patient's mood and manner are appropriate. Grooming and personal hygiene are appropriate.  MEDICAL DECISION MAKING: Patient here with tenderness to palpation in the left lower corner and consistent with her history of left ovarian cyst. She is well-appearing, nontoxic, no systemic symptoms. States her pain is unchanged. I do not think that she has a torsion today I do not feel she needs a repeat of her ultrasound. She states the only reason  she is here is to have a Lupron injection. Discussed with patient I will discuss this with Dr. Glo Herring but I think that this is going to be managed as an outpatient. She understands this plan.  ED PROGRESS:   6:05 PM- I spoke with Dr. Glo Herring confirming Pts appointment on Monday. He advised that the injection she is requesting is Lupron and that this medication requires preauthorization. He states that he will see her as an outpatient to discuss this further.  I discussed this with the Pt and she is agreeable to the plan. Patient has pain medication at home for her cyst. Discussed with her return to questions. She verbalizes understanding and is comfortable plan.     [place], DO 03/14/14 1907

## 2014-03-19 ENCOUNTER — Ambulatory Visit (INDEPENDENT_AMBULATORY_CARE_PROVIDER_SITE_OTHER): Payer: BC Managed Care – PPO | Admitting: Obstetrics and Gynecology

## 2014-03-19 ENCOUNTER — Encounter: Payer: BC Managed Care – PPO | Admitting: Obstetrics and Gynecology

## 2014-03-19 ENCOUNTER — Encounter: Payer: Self-pay | Admitting: Obstetrics and Gynecology

## 2014-03-19 VITALS — BP 100/70 | Ht 60.0 in | Wt 217.0 lb

## 2014-03-19 DIAGNOSIS — N8320 Unspecified ovarian cysts: Secondary | ICD-10-CM

## 2014-03-19 DIAGNOSIS — N83202 Unspecified ovarian cyst, left side: Secondary | ICD-10-CM

## 2014-03-19 MED ORDER — HYDROCODONE-ACETAMINOPHEN 5-325 MG PO TABS: 1.0000 | ORAL_TABLET | Freq: Four times a day (QID) | ORAL | Status: DC | PRN

## 2014-03-19 MED ORDER — LEVONORGEST-ETH ESTRAD 91-DAY 0.15-0.03 &0.01 MG PO TABS: 1.0000 | ORAL_TABLET | Freq: Every day | ORAL | Status: DC

## 2014-03-19 NOTE — Patient Instructions (Signed)
Ovarian Cyst An ovarian cyst is a fluid-filled sac that forms on an ovary. The ovaries are small organs that produce eggs in women. Various types of cysts can form on the ovaries. Most are not cancerous. Many do not cause problems, and they often go away on their own. Some may cause symptoms and require treatment. Common types of ovarian cysts include:  Functional cysts--These cysts may occur every month during the menstrual cycle. This is normal. The cysts usually go away with the next menstrual cycle if the woman does not get pregnant. Usually, there are no symptoms with a functional cyst.  Endometrioma cysts--These cysts form from the tissue that lines the uterus. They are also called "chocolate cysts" because they become filled with blood that turns brown. This type of cyst can cause pain in the lower abdomen during intercourse and with your menstrual period.  Cystadenoma cysts--This type develops from the cells on the outside of the ovary. These cysts can get very big and cause lower abdomen pain and pain with intercourse. This type of cyst can twist on itself, cut off its blood supply, and cause severe pain. It can also easily rupture and cause a lot of pain.  Dermoid cysts--This type of cyst is sometimes found in both ovaries. These cysts may contain different kinds of body tissue, such as skin, teeth, hair, or cartilage. They usually do not cause symptoms unless they get very big.  Theca lutein cysts--These cysts occur when too much of a certain hormone (human chorionic gonadotropin) is produced and overstimulates the ovaries to produce an egg. This is most common after procedures used to assist with the conception of a baby (in vitro fertilization). CAUSES   Fertility drugs can cause a condition in which multiple large cysts are formed on the ovaries. This is called ovarian hyperstimulation syndrome.  A condition called polycystic ovary syndrome can cause hormonal imbalances that can lead to  nonfunctional ovarian cysts. SIGNS AND SYMPTOMS  Many ovarian cysts do not cause symptoms. If symptoms are present, they may include:  Pelvic pain or pressure.  Pain in the lower abdomen.  Pain during sexual intercourse.  Increasing girth (swelling) of the abdomen.  Abnormal menstrual periods.  Increasing pain with menstrual periods.  Stopping having menstrual periods without being pregnant. DIAGNOSIS  These cysts are commonly found during a routine or annual pelvic exam. Tests may be ordered to find out more about the cyst. These tests may include:  Ultrasound.  X-ray of the pelvis.  CT scan.  MRI.  Blood tests. TREATMENT  Many ovarian cysts go away on their own without treatment. Your health care provider may want to check your cyst regularly for 2-3 months to see if it changes. For women in menopause, it is particularly important to monitor a cyst closely because of the higher rate of ovarian cancer in menopausal women. When treatment is needed, it may include any of the following:  A procedure to drain the cyst (aspiration). This may be done using a long needle and ultrasound. It can also be done through a laparoscopic procedure. This involves using a thin, lighted tube with a tiny camera on the end (laparoscope) inserted through a small incision.  Surgery to remove the whole cyst. This may be done using laparoscopic surgery or an open surgery involving a larger incision in the lower abdomen.  Hormone treatment or birth control pills. These methods are sometimes used to help dissolve a cyst. HOME CARE INSTRUCTIONS   Only take over-the-counter   or prescription medicines as directed by your health care provider.  Follow up with your health care provider as directed.  Get regular pelvic exams and Pap tests. SEEK MEDICAL CARE IF:   Your periods are late, irregular, or painful, or they stop.  Your pelvic pain or abdominal pain does not go away.  Your abdomen becomes  larger or swollen.  You have pressure on your bladder or trouble emptying your bladder completely.  You have pain during sexual intercourse.  You have feelings of fullness, pressure, or discomfort in your stomach.  You lose weight for no apparent reason.  You feel generally ill.  You become constipated.  You lose your appetite.  You develop acne.  You have an increase in body and facial hair.  You are gaining weight, without changing your exercise and eating habits.  You think you are pregnant. SEEK IMMEDIATE MEDICAL CARE IF:   You have increasing abdominal pain.  You feel sick to your stomach (nauseous), and you throw up (vomit).  You develop a fever that comes on suddenly.  You have abdominal pain during a bowel movement.  Your menstrual periods become heavier than usual. MAKE SURE YOU:  Understand these instructions.  Will watch your condition.  Will get help right away if you are not doing well or get worse. Document Released: 06/01/2005 Document Revised: 06/06/2013 Document Reviewed: 02/06/2013 [employer] Patient Information 2015 [employer]. This information is not intended to replace advice given to you by your health care provider. Make sure you discuss any questions you have with your health care provider.  

## 2014-03-19 NOTE — Progress Notes (Signed)
Patient ID: [name], female   DOB: 1981/02/28, 33 y.o.   MRN: [id number]   [facility]  Patient name: Jasmin Chen MRN [id number]  Date of birth: Jul 24, 1980  CC & HPI:  Jasmin Chen is a 33 y.o. female with a history of ovarian cysts presenting today for follow-up after being hospitalized last week for pain associated with ovarian cysts.  She rates her pain at an 8/10 currently with the left side hurting worse than the right.  She is not complaining of any vaginal bleeding.  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   ROS:  All systems are reviewed and are negative unless otherwise specified in the HPI.   Pertinent History Reviewed:   Reviewed: Significant for  Medical         Past Medical History  Diagnosis Date  . Asthma   . Multiple environmental allergies   . IBS (irritable bowel syndrome)   . Chronic rhinitis   . Migraine without aura 2009  . Hidradenitis suppurativa   . Hypertension   . Other and unspecified ovarian cyst 02/06/2013  . Chronic knee pain   . Right leg weakness     chronic  . Ovarian cyst                               Surgical Hx:    Past Surgical History  Procedure Laterality Date  . Deviated septum repair    . 2 cysts removed from arm    . Breast reduction surgery    . Abdominal hysterectomy     Medications: Reviewed & Updated - see associated section                      Current outpatient prescriptions:albuterol (PROVENTIL HFA;VENTOLIN HFA) 108 (90 BASE) MCG/ACT inhaler, Inhale 2 puffs into the lungs every 4 (four) hours as needed for wheezing or shortness of breath. , Disp: , Rfl: ;  albuterol (PROVENTIL) (2.5 MG/3ML) 0.083% nebulizer solution, Take 2.5 mg by nebulization every 4 (four) hours as needed for wheezing or shortness of breath. , Disp: , Rfl:  ALPRAZolam (XANAX) 0.25 MG tablet, Take 0.125-0.25 mg by mouth at bedtime as  needed for anxiety., Disp: , Rfl: ;  cetirizine (ZYRTEC) 10 MG tablet, Take 10 mg by mouth daily., Disp: , Rfl: ;  docusate sodium (COLACE) 100 MG capsule, Take 1 capsule (100 mg total) by mouth 2 (two) times daily as needed., Disp: 30 capsule, Rfl: 2;  fluticasone (FLONASE) 50 MCG/ACT nasal spray, Place 2 sprays into both nostrils daily. , Disp: , Rfl:  HYDROcodone-acetaminophen (NORCO/VICODIN) 5-325 MG per tablet, , Disp: , Rfl: ;  ibuprofen (ADVIL,MOTRIN) 600 MG tablet, Take 1 tablet (600 mg total) by mouth every 6 (six) hours as needed., Disp: 60 tablet, Rfl: 5;  omeprazole (PRILOSEC) 20 MG capsule, Take 20 mg by mouth daily as needed (Acid Reflux)., Disp: , Rfl:  oxyCODONE-acetaminophen (PERCOCET/ROXICET) 5-325 MG per tablet, Take 1 tablet by mouth every 4 (four) hours as needed., Disp: 30 tablet, Rfl: 0;  promethazine (PHENERGAN) 25 MG tablet, Take 1 tablet (25 mg total) by mouth every 6 (six) hours as needed for nausea., Disp: 30 tablet, Rfl: 0;  topiramate (TOPAMAX) 50 MG tablet, Take 50 mg by mouth 2 (two) times daily., Disp: , Rfl:  norgestimate-ethinyl estradiol (ORTHO-CYCLEN,SPRINTEC,PREVIFEM) 0.25-35 MG-MCG tablet, Take 1 tablet by mouth daily., Disp: 1 Package, Rfl: 11   Social History: Reviewed -  reports that she has never smoked. She has never used smokeless tobacco.  Objective Findings:  Vitals: Blood pressure 100/70, height 5' (1.524 m), weight 217 lb (98.431 kg).  Physical Examination: General appearance - alert, well appearing, and in no distress, oriented to person, place, and time and overweight Pelvic - VULVA: normal appearing vulva with no masses, tenderness or lesions,  VAGINA: normal appearing vagina with normal color and discharge, no lesions, normal secretions, good vaginal length CERVIX: normal appearing cervix without discharge or lesions UTERUS: surgically absent  ADNEXA: normal appearing adnexa with mild tenderness in pelvis, masses/cyst cannot be palpated due to  habitus, , tenderness mild still present  Assessment & Plan:   A:  1. Symptomatic ovarian cyst  P:  1. Start pt on oral contraceptive and pain medication 2. Follow-up in 1 wk to decide on Lupron if uncontrolled pain.     This chart was scribed for Jonnie Kind, MD by Donato Schultz, ED Scribe. This patient was seen in room 2 and the patient's care was started at 10:24 AM.

## 2014-03-19 NOTE — Progress Notes (Signed)
Patient ID: Jasmin Chen, female   DOB: [birth year], 32 y.o.   MRN: [id number] Pt here today for follow up for hospital admission last week. Pt has ovarian cysts. Pt rates her pain an 8 on a scale from 0-10. Pt states that  teh left side is worse than the right.

## 2014-03-22 ENCOUNTER — Other Ambulatory Visit: Payer: Self-pay | Admitting: Obstetrics and Gynecology

## 2014-03-22 DIAGNOSIS — N83202 Unspecified ovarian cyst, left side: Secondary | ICD-10-CM

## 2014-03-26 ENCOUNTER — Ambulatory Visit (INDEPENDENT_AMBULATORY_CARE_PROVIDER_SITE_OTHER): Payer: BC Managed Care – PPO

## 2014-03-26 ENCOUNTER — Encounter: Payer: Self-pay | Admitting: Obstetrics and Gynecology

## 2014-03-26 ENCOUNTER — Ambulatory Visit (INDEPENDENT_AMBULATORY_CARE_PROVIDER_SITE_OTHER): Payer: BC Managed Care – PPO | Admitting: Obstetrics and Gynecology

## 2014-03-26 VITALS — BP 110/76 | Ht 60.0 in | Wt 213.0 lb

## 2014-03-26 DIAGNOSIS — N83202 Unspecified ovarian cyst, left side: Secondary | ICD-10-CM

## 2014-03-26 DIAGNOSIS — N8320 Unspecified ovarian cysts: Secondary | ICD-10-CM

## 2014-03-26 MED ORDER — TRAMADOL HCL 50 MG PO TABS: 50.0000 mg | ORAL_TABLET | Freq: Four times a day (QID) | ORAL | Status: DC | PRN

## 2014-03-26 NOTE — Patient Instructions (Signed)

## 2014-03-26 NOTE — Progress Notes (Signed)
Patient ID: [name], female   DOB: [birth year], 33 y.o.   MRN: [id number]   [facility] Visit  Patient name: Jasmin Chen MRN [id number]  Date of birth: Dec 14, 1980  CC & HPI:  Jasmin Chen is a 33 y.o. female presenting today for follow-up of an ovarian cyst and to discuss her [place] results.  She rates her pain at a 7/10 and is unable to sleep through the pain at times.  She has been taking Vicodin at bedtime and Ibuprofen throughout the day with no temporary relief to her pain.  She had an abdominal hysterectomy 3 years ago.   ROS:  All systems have been reviewed and are negative unless otherwise indicated in the HPI.  Pertinent History Reviewed:   Reviewed: Significant for  Medical         Past Medical History  Diagnosis Date  . Asthma   . Multiple environmental allergies   . IBS (irritable bowel syndrome)   . Chronic rhinitis   . Migraine without aura 2009  . Hidradenitis suppurativa   . Hypertension   . Other and unspecified ovarian cyst 02/06/2013  . Chronic knee pain   . Right leg weakness     chronic  . Ovarian cyst                               Surgical Hx:    Past Surgical History  Procedure Laterality Date  . Deviated septum repair    . 2 cysts removed from arm    . Breast reduction surgery    . Abdominal hysterectomy     Medications: Reviewed & Updated - see associated section                      Current outpatient prescriptions:albuterol (PROVENTIL HFA;VENTOLIN HFA) 108 (90 BASE) MCG/ACT inhaler, Inhale 2 puffs into the lungs every 4 (four) hours as needed for wheezing or shortness of breath. , Disp: , Rfl: ;  albuterol (PROVENTIL) (2.5 MG/3ML) 0.083% nebulizer solution, Take 2.5 mg by nebulization every 4 (four) hours as needed for wheezing or shortness of breath. , Disp: , Rfl:  ALPRAZolam (XANAX) 0.25 MG tablet, Take 0.125-0.25 mg by mouth at bedtime as needed for anxiety., Disp: , Rfl: ;  cetirizine (ZYRTEC) 10 MG tablet, Take 10 mg by mouth  daily., Disp: , Rfl: ;  docusate sodium (COLACE) 100 MG capsule, Take 1 capsule (100 mg total) by mouth 2 (two) times daily as needed., Disp: 30 capsule, Rfl: 2;  fluticasone (FLONASE) 50 MCG/ACT nasal spray, Place 2 sprays into both nostrils daily. , Disp: , Rfl:  HYDROcodone-acetaminophen (NORCO/VICODIN) 5-325 MG per tablet, Take 1-2 tablets by mouth every 6 (six) hours as needed for moderate pain., Disp: 60 tablet, Rfl: 0;  ibuprofen (ADVIL,MOTRIN) 600 MG tablet, Take 1 tablet (600 mg total) by mouth every 6 (six) hours as needed., Disp: 60 tablet, Rfl: 5;  Levonorgestrel-Ethinyl Estradiol (AMETHIA,CAMRESE) 0.15-0.03 &0.01 MG tablet, Take 1 tablet by mouth daily., Disp: 1 Package, Rfl: 4 norgestimate-ethinyl estradiol (ORTHO-CYCLEN,SPRINTEC,PREVIFEM) 0.25-35 MG-MCG tablet, Take 1 tablet by mouth daily., Disp: 1 Package, Rfl: 11;  omeprazole (PRILOSEC) 20 MG capsule, Take 20 mg by mouth daily as needed (Acid Reflux)., Disp: , Rfl: ;  oxyCODONE-acetaminophen (PERCOCET/ROXICET) 5-325 MG per tablet, Take 1 tablet by mouth every 4 (four) hours as needed., Disp: 30 tablet, Rfl: 0 promethazine (PHENERGAN) 25 MG tablet,  Take 1 tablet (25 mg total) by mouth every 6 (six) hours as needed for nausea., Disp: 30 tablet, Rfl: 0;  topiramate (TOPAMAX) 50 MG tablet, Take 50 mg by mouth 2 (two) times daily., Disp: , Rfl:    Social History: Reviewed -  reports that she has never smoked. She has never used smokeless tobacco.  Objective Findings:  Vitals: Blood pressure 110/76, height 5' (1.524 m), weight 213 lb (96.616 kg).  Physical Examination: Discussion only.   Assessment & Plan:   A:  1. Symptomatic ovarian  Cyst, improving slowly  P:  1. Continue oral contraceptive and mild pain medication(Tramadol 2. Follow-up in 6 wks     This chart was scribed for Jonnie Kind, MD by Donato Schultz, ED Scribe. The patient's care was started at 12:18 PM.

## 2014-04-03 DIAGNOSIS — Z903 Acquired absence of stomach [part of]: Secondary | ICD-10-CM | POA: Insufficient documentation

## 2014-05-07 ENCOUNTER — Ambulatory Visit: Payer: BC Managed Care – PPO | Admitting: Obstetrics and Gynecology

## 2014-06-08 ENCOUNTER — Encounter (HOSPITAL_COMMUNITY): Payer: Self-pay | Admitting: *Deleted

## 2014-06-08 ENCOUNTER — Emergency Department (HOSPITAL_COMMUNITY)
Admission: EM | Admit: 2014-06-08 | Discharge: 2014-06-08 | Disposition: A | Discharge status: Home / Self Care | Payer: BC Managed Care – PPO | Attending: Emergency Medicine | Admitting: Emergency Medicine

## 2014-06-08 DIAGNOSIS — I10 Essential (primary) hypertension: Secondary | ICD-10-CM | POA: Diagnosis not present

## 2014-06-08 DIAGNOSIS — R509 Fever, unspecified: Secondary | ICD-10-CM | POA: Diagnosis present

## 2014-06-08 DIAGNOSIS — Z8742 Personal history of other diseases of the female genital tract: Secondary | ICD-10-CM | POA: Diagnosis not present

## 2014-06-08 DIAGNOSIS — J111 Influenza due to unidentified influenza virus with other respiratory manifestations: Secondary | ICD-10-CM | POA: Insufficient documentation

## 2014-06-08 DIAGNOSIS — Z7951 Long term (current) use of inhaled steroids: Secondary | ICD-10-CM | POA: Diagnosis not present

## 2014-06-08 DIAGNOSIS — Z79899 Other long term (current) drug therapy: Secondary | ICD-10-CM | POA: Insufficient documentation

## 2014-06-08 DIAGNOSIS — R6889 Other general symptoms and signs: Secondary | ICD-10-CM

## 2014-06-08 DIAGNOSIS — Z872 Personal history of diseases of the skin and subcutaneous tissue: Secondary | ICD-10-CM | POA: Insufficient documentation

## 2014-06-08 DIAGNOSIS — J45909 Unspecified asthma, uncomplicated: Secondary | ICD-10-CM | POA: Diagnosis not present

## 2014-06-08 DIAGNOSIS — G43909 Migraine, unspecified, not intractable, without status migrainosus: Secondary | ICD-10-CM | POA: Insufficient documentation

## 2014-06-08 DIAGNOSIS — Z8719 Personal history of other diseases of the digestive system: Secondary | ICD-10-CM | POA: Insufficient documentation

## 2014-06-08 DIAGNOSIS — G8929 Other chronic pain: Secondary | ICD-10-CM | POA: Insufficient documentation

## 2014-06-08 LAB — URINALYSIS, ROUTINE W REFLEX MICROSCOPIC
Bilirubin Urine: NEGATIVE
Glucose, UA: NEGATIVE mg/dL
Hgb urine dipstick: NEGATIVE
Ketones, ur: 40 mg/dL — AB
Leukocytes, UA: NEGATIVE
Nitrite: NEGATIVE
Protein, ur: NEGATIVE mg/dL
Specific Gravity, Urine: 1.02 (ref 1.005–1.030)
Urobilinogen, UA: 0.2 mg/dL (ref 0.0–1.0)
pH: 6 (ref 5.0–8.0)

## 2014-06-08 LAB — CBC WITH DIFFERENTIAL/PLATELET
Basophils Absolute: 0 10*3/uL (ref 0.0–0.1)
Basophils Relative: 1 % (ref 0–1)
Eosinophils Absolute: 0.2 10*3/uL (ref 0.0–0.7)
Eosinophils Relative: 3 % (ref 0–5)
HCT: 36.2 % (ref 36.0–46.0)
Hemoglobin: 12 g/dL (ref 12.0–15.0)
Lymphocytes Relative: 30 % (ref 12–46)
Lymphs Abs: 1.8 10*3/uL (ref 0.7–4.0)
MCH: 28.2 pg (ref 26.0–34.0)
MCHC: 33.1 g/dL (ref 30.0–36.0)
MCV: 85 fL (ref 78.0–100.0)
Monocytes Absolute: 0.8 10*3/uL (ref 0.1–1.0)
Monocytes Relative: 13 % — ABNORMAL HIGH (ref 3–12)
Neutro Abs: 3.2 10*3/uL (ref 1.7–7.7)
Neutrophils Relative %: 53 % (ref 43–77)
Platelets: 174 10*3/uL (ref 150–400)
RBC: 4.26 MIL/uL (ref 3.87–5.11)
RDW: 15.7 % — ABNORMAL HIGH (ref 11.5–15.5)
WBC: 6 10*3/uL (ref 4.0–10.5)

## 2014-06-08 LAB — BASIC METABOLIC PANEL
Anion gap: 6 (ref 5–15)
BUN: 6 mg/dL (ref 6–23)
CO2: 26 mmol/L (ref 19–32)
Calcium: 7.9 mg/dL — ABNORMAL LOW (ref 8.4–10.5)
Chloride: 108 mEq/L (ref 96–112)
Creatinine, Ser: 0.42 mg/dL — ABNORMAL LOW (ref 0.50–1.10)
GFR calc Af Amer: >90 mL/min (ref >90)
GFR calc non Af Amer: >90 mL/min (ref >90)
Glucose, Bld: 91 mg/dL (ref 70–99)
Potassium: 3 mmol/L — ABNORMAL LOW (ref 3.5–5.1)
Sodium: 140 mmol/L (ref 135–145)

## 2014-06-08 LAB — RAPID STREP SCREEN (MED CTR MEBANE ONLY): Streptococcus, Group A Screen (Direct): NEGATIVE

## 2014-06-08 MED ORDER — POTASSIUM CHLORIDE CRYS ER 20 MEQ PO TBCR
40.0000 meq | EXTENDED_RELEASE_TABLET | Freq: Once | ORAL | Status: AC
  Administered 2014-06-08: 40 meq via ORAL
  Filled 2014-06-08: qty 2

## 2014-06-08 MED ORDER — SODIUM CHLORIDE 0.9 % IV BOLUS (SEPSIS)
1000.0000 mL | Freq: Once | INTRAVENOUS | Status: AC
  Administered 2014-06-08: 1000 mL via INTRAVENOUS

## 2014-06-08 MED ORDER — ONDANSETRON HCL 4 MG/2ML IJ SOLN
INTRAMUSCULAR | Status: AC
  Filled 2014-06-08: qty 2

## 2014-06-08 MED ORDER — ONDANSETRON HCL 8 MG PO TABS
8.0000 mg | ORAL_TABLET | Freq: Three times a day (TID) | ORAL | Status: DC | PRN
Start: 2014-06-08 — End: 2014-11-26

## 2014-06-08 MED ORDER — ONDANSETRON HCL 4 MG/2ML IJ SOLN: 4.0000 mg | Freq: Once | INTRAMUSCULAR | Status: DC

## 2014-06-08 MED ORDER — ONDANSETRON HCL 4 MG/2ML IJ SOLN
4.0000 mg | Freq: Once | INTRAMUSCULAR | Status: AC
  Administered 2014-06-08: 4 mg via INTRAVENOUS

## 2014-06-08 NOTE — ED Notes (Signed)
Pt c/o fever and body aches, + nausea but denies diarrhea or vomiting, taken NyQuil, ibuprofen at home

## 2014-06-08 NOTE — Discharge Instructions (Signed)
Viral Infections A viral infection can be caused by different types of viruses.Most viral infections are not serious and resolve on their own. However, some infections may cause severe symptoms and may lead to further complications. SYMPTOMS Viruses can frequently cause:  Minor sore throat.  Aches and pains.  Headaches.  Runny nose.  Different types of rashes.  Watery eyes.  Tiredness.  Cough.  Loss of appetite.  Gastrointestinal infections, resulting in nausea, vomiting, and diarrhea. These symptoms do not respond to antibiotics because the infection is not caused by bacteria. However, you might catch a bacterial infection following the viral infection. This is sometimes called a "superinfection." Symptoms of such a bacterial infection may include:  Worsening sore throat with pus and difficulty swallowing.  Swollen neck glands.  Chills and a high or persistent fever.  Severe headache.  Tenderness over the sinuses.  Persistent overall ill feeling (malaise), muscle aches, and tiredness (fatigue).  Persistent cough.  Yellow, green, or brown mucus production with coughing. HOME CARE INSTRUCTIONS   Only take over-the-counter or prescription medicines for pain, discomfort, diarrhea, or fever as directed by your caregiver.  Drink enough water and fluids to keep your urine clear or pale yellow. Sports drinks can provide valuable electrolytes, sugars, and hydration.  Get plenty of rest and maintain proper nutrition. Soups and broths with crackers or rice are fine. SEEK IMMEDIATE MEDICAL CARE IF:   You have severe headaches, shortness of breath, chest pain, neck pain, or an unusual rash.  You have uncontrolled vomiting, diarrhea, or you are unable to keep down fluids.  You or your child has an oral temperature above 102 F (38.9 C), not controlled by medicine.  Your baby is older than 3 months with a rectal temperature of 102 F (38.9 C) or higher.  Your baby is 33  months old or younger with a rectal temperature of 100.4 F (38 C) or higher. MAKE SURE YOU:   Understand these instructions.  Will watch your condition.  Will get help right away if you are not doing well or get worse. Document Released: 03/11/2005 Document Revised: 08/24/2011 Document Reviewed: 10/06/2010 [facility] Patient Information 2015 [place], [place]. This information is not intended to replace advice given to you by your health care provider. Make sure you discuss any questions you have with your health care provider.  Rest and make sure you drinking plenty of fluids.  Use the medication prescribed if you have persistent nausea.  Follow-up with your doctor for recheck if your symptoms worsen or not improving over the next several days.

## 2014-06-08 NOTE — ED Provider Notes (Signed)
CSN: [id number]     Arrival date & time 06/08/14  1626 History   First MD Initiated Contact with Patient 06/08/14 1641     Chief Complaint  Patient presents with  . Fever     (Consider location/radiation/quality/duration/timing/severity/associated sxs/prior Treatment) The history is provided by the patient.   Jasmin Chen is a 33 y.o. female presenting with the 24-hour history of fever to 102, sore throat, ear pain, generalized fatigue along with chills and myalgias.  She has taken both NyQuil and Aleve for her symptoms her last dose taken around noon today.  She feels lightheaded with ambulation, and endorses that she has not urinated since yesterday and is concerned about being dehydrated.  She has nausea without emesis or diarrhea.  She has been able to tolerate by mouth fluids, but has had a decreased appetite for solid food.  She denies shortness of breath, cough, chest pain.  Past Medical History  Diagnosis Date  . Asthma   . Multiple environmental allergies   . IBS (irritable bowel syndrome)   . Chronic rhinitis   . Migraine without aura 2009  . Hidradenitis suppurativa   . Hypertension   . Other and unspecified ovarian cyst 02/06/2013  . Chronic knee pain   . Right leg weakness     chronic  . Ovarian cyst    Past Surgical History  Procedure Laterality Date  . Deviated septum repair    . 2 cysts removed from arm    . Breast reduction surgery    . Abdominal hysterectomy    . Laparoscopic gastric sleeve resection     Family History  Problem Relation Age of Onset  . Hypertension Mother   . Diabetes Mother   . Hyperlipidemia Mother   . Hypertension Father   . Hyperlipidemia Father   . Heart attack Father   . Sarcoidosis Sister   . Thyroid disease Sister   . Hyperlipidemia Sister   . Hypertension Sister    History  Substance Use Topics  . Smoking status: Never Smoker   . Smokeless tobacco: Never Used  . Alcohol Use: No   OB History    No data available      Review of Systems  Constitutional: Positive for fever, chills and fatigue.  HENT: Positive for ear pain and sore throat. Negative for congestion and ear discharge.   Eyes: Negative.   Respiratory: Negative for chest tightness and shortness of breath.   Cardiovascular: Negative for chest pain.  Gastrointestinal: Positive for nausea. Negative for vomiting, abdominal pain and diarrhea.  Genitourinary: Negative.   Musculoskeletal: Positive for myalgias. Negative for joint swelling, arthralgias and neck pain.  Skin: Negative.  Negative for rash and wound.  Neurological: Positive for weakness and light-headedness. Negative for dizziness, numbness and headaches.  Psychiatric/Behavioral: Negative.       Allergies  Prednisone  Home Medications   Prior to Admission medications   Medication Sig Start Date End Date Taking? Authorizing Provider  albuterol (PROVENTIL HFA;VENTOLIN HFA) 108 (90 BASE) MCG/ACT inhaler Inhale 2 puffs into the lungs every 4 (four) hours as needed for wheezing or shortness of breath.    Yes Historical Provider, MD  albuterol (PROVENTIL) (2.5 MG/3ML) 0.083% nebulizer solution Take 2.5 mg by nebulization every 4 (four) hours as needed for wheezing or shortness of breath.    Yes Historical Provider, MD  ALPRAZolam (XANAX) 0.25 MG tablet Take 0.125-0.25 mg by mouth at bedtime as needed for anxiety.   Yes Historical Provider, MD  cetirizine (ZYRTEC) 10 MG tablet Take 10 mg by mouth daily.   Yes Historical Provider, MD  docusate sodium (COLACE) 100 MG capsule Take 1 capsule (100 mg total) by mouth 2 (two) times daily as needed. Patient taking differently: Take 100 mg by mouth 2 (two) times daily as needed for mild constipation.  03/13/14  Yes Jonnie Kind, MD  fluticasone (FLONASE) 50 MCG/ACT nasal spray Place 2 sprays into both nostrils daily.  06/21/13  Yes Historical Provider, MD  ibuprofen (ADVIL,MOTRIN) 600 MG tablet Take 1 tablet (600 mg total) by mouth every 6 (six)  hours as needed. Patient taking differently: Take 600 mg by mouth every 6 (six) hours as needed for moderate pain.  09/26/13  Yes Carole Civil, MD  Levonorgestrel-Ethinyl Estradiol (AMETHIA,CAMRESE) 0.15-0.03 &0.01 MG tablet Take 1 tablet by mouth daily. 03/19/14  Yes Jonnie Kind, MD  omeprazole (PRILOSEC) 20 MG capsule Take 20 mg by mouth daily as needed (Acid Reflux).   Yes Historical Provider, MD  topiramate (TOPAMAX) 50 MG tablet Take 50 mg by mouth 2 (two) times daily.   Yes Historical Provider, MD  norgestimate-ethinyl estradiol (ORTHO-CYCLEN,SPRINTEC,PREVIFEM) 0.25-35 MG-MCG tablet Take 1 tablet by mouth daily. Patient not taking: Reported on 06/08/2014 03/13/14   Jonnie Kind, MD  ondansetron (ZOFRAN) 8 MG tablet Take 1 tablet (8 mg total) by mouth every 8 (eight) hours as needed for nausea or vomiting. 06/08/14   Evalee Jefferson, PA-C  oxyCODONE-acetaminophen (PERCOCET/ROXICET) 5-325 MG per tablet Take 1 tablet by mouth every 4 (four) hours as needed. Patient not taking: Reported on 06/08/2014 03/13/14   Jonnie Kind, MD  promethazine (PHENERGAN) 25 MG tablet Take 1 tablet (25 mg total) by mouth every 6 (six) hours as needed for nausea. Patient not taking: Reported on 06/08/2014 03/13/14   Jonnie Kind, MD  traMADol (ULTRAM) 50 MG tablet Take 1 tablet (50 mg total) by mouth every 6 (six) hours as needed. Patient not taking: Reported on 06/08/2014 03/26/14   Jonnie Kind, MD   BP 97/62 mmHg  Pulse 77  Temp(Src) 97.8 F (36.6 C) (Oral)  Resp 18  SpO2 100% Physical Exam  Constitutional: She is oriented to person, place, and time. She appears well-developed and well-nourished.  HENT:  Head: Normocephalic and atraumatic.  Right Ear: Tympanic membrane, external ear and ear canal normal.  Left Ear: Tympanic membrane, external ear and ear canal normal.  Nose: No mucosal edema or rhinorrhea.  Mouth/Throat: Uvula is midline and mucous membranes are normal. Posterior  oropharyngeal erythema present. No oropharyngeal exudate, posterior oropharyngeal edema or tonsillar abscesses.  Buccal mucosa dry.  Eyes: Conjunctivae are normal.  Cardiovascular: Normal rate and normal heart sounds.   Pulmonary/Chest: Effort normal. No respiratory distress. She has no wheezes. She has no rales.  Abdominal: Soft. There is no tenderness.  Musculoskeletal: Normal range of motion.  Neurological: She is alert and oriented to person, place, and time.  Skin: Skin is warm and dry. No rash noted.  Psychiatric: She has a normal mood and affect.    ED Course  Procedures (including critical care time) Labs Review Labs Reviewed  BASIC METABOLIC PANEL - Abnormal; Notable for the following:    Potassium 3.0 (*)    Creatinine, Ser 0.42 (*)    Calcium 7.9 (*)    All other components within normal limits  CBC WITH DIFFERENTIAL - Abnormal; Notable for the following:    RDW 15.7 (*)    Monocytes Relative 13 (*)  All other components within normal limits  URINALYSIS, ROUTINE W REFLEX MICROSCOPIC - Abnormal; Notable for the following:    Ketones, ur 40 (*)    All other components within normal limits  RAPID STREP SCREEN  CULTURE, GROUP A STREP    Imaging Review No results found.   EKG Interpretation None      MDM   Final diagnoses:  Flu-like symptoms    Patients labs and/or radiological studies were viewed and considered during the medical decision making and disposition process. Pt was given IV fluids 3 L,  Plus she tolerated PO fluids.  zofran given with relief of nausea.  She was ambulatory, no weakness or lightheadedness.  She remained hypotensive, but pulse improved, no complaint of further weakness.  Review of prior bp's reveal relatively low bp chronically.  She was encouraged rest, increased fluid intake, zofran prescribed for prn use.  Advised recheck by pcp or return here for any worsened sx.  Replaced potassium while here.    Evalee Jefferson, PA-C 06/09/14  0050  Barber Alvino Chapel, MD 06/10/14 [phone]

## 2014-06-10 LAB — CULTURE, GROUP A STREP

## 2014-06-18 ENCOUNTER — Encounter: Payer: Self-pay | Admitting: Obstetrics and Gynecology

## 2014-06-18 ENCOUNTER — Ambulatory Visit: Payer: BC Managed Care – PPO | Admitting: Obstetrics and Gynecology

## 2014-06-21 ENCOUNTER — Encounter: Payer: Self-pay | Admitting: Obstetrics and Gynecology

## 2014-06-21 ENCOUNTER — Ambulatory Visit (INDEPENDENT_AMBULATORY_CARE_PROVIDER_SITE_OTHER): Payer: BLUE CROSS/BLUE SHIELD | Admitting: Obstetrics and Gynecology

## 2014-06-21 VITALS — BP 120/80 | Ht 63.0 in | Wt 181.0 lb

## 2014-06-21 DIAGNOSIS — N8320 Unspecified ovarian cysts: Secondary | ICD-10-CM

## 2014-06-21 DIAGNOSIS — N83202 Unspecified ovarian cyst, left side: Secondary | ICD-10-CM

## 2014-06-21 MED ORDER — TRAMADOL HCL 50 MG PO TABS: 50.0000 mg | ORAL_TABLET | Freq: Four times a day (QID) | ORAL | Status: DC | PRN

## 2014-06-21 NOTE — Progress Notes (Signed)
Patient ID: Jasmin Chen, female   DOB: 12/31/1980, 34 y.o.   MRN: [id number] Pt here today for follow up from ovary pain and problems. Pt states that she is still having pain but not as bad.    [facility] Visit  Patient name: Jasmin Chen MRN [id number]  Date of birth: Jan 30, 1981  CC & HPI:  Jasmin Chen is a 34 y.o. female presenting today for followup of LLQpain attibuted to small l ov cyst. Pt reports that she's fine.  ROS:  BC seasonique 84pill pack  Pertinent History Reviewed:   Reviewed: Significant for no wt or gi changes, has chosen to lose wt x 40 pounds.s/p gastric sleve. Medical         Past Medical History  Diagnosis Date  . Asthma   . Multiple environmental allergies   . IBS (irritable bowel syndrome)   . Chronic rhinitis   . Migraine without aura 2009  . Hidradenitis suppurativa   . Hypertension   . Other and unspecified ovarian cyst 02/06/2013  . Chronic knee pain   . Right leg weakness     chronic  . Ovarian cyst                               Surgical Hx:    Past Surgical History  Procedure Laterality Date  . Deviated septum repair    . 2 cysts removed from arm    . Breast reduction surgery    . Abdominal hysterectomy    . Laparoscopic gastric sleeve resection     Medications: Reviewed & Updated - see associated section                      Current outpatient prescriptions: albuterol (PROVENTIL HFA;VENTOLIN HFA) 108 (90 BASE) MCG/ACT inhaler, Inhale 2 puffs into the lungs every 4 (four) hours as needed for wheezing or shortness of breath. , Disp: , Rfl: ;  albuterol (PROVENTIL) (2.5 MG/3ML) 0.083% nebulizer solution, Take 2.5 mg by nebulization every 4 (four) hours as needed for wheezing or shortness of breath. , Disp: , Rfl:  ALPRAZolam (XANAX) 0.25 MG tablet, Take 0.125-0.25 mg by mouth at bedtime as needed for anxiety., Disp: , Rfl: ;  cetirizine (ZYRTEC) 10 MG tablet, Take 10 mg by mouth daily., Disp: , Rfl: ;  docusate sodium (COLACE)  100 MG capsule, Take 1 capsule (100 mg total) by mouth 2 (two) times daily as needed. (Patient taking differently: Take 100 mg by mouth 2 (two) times daily as needed for mild constipation. ), Disp: 30 capsule, Rfl: 2 fluticasone (FLONASE) 50 MCG/ACT nasal spray, Place 2 sprays into both nostrils daily. , Disp: , Rfl: ;  ibuprofen (ADVIL,MOTRIN) 600 MG tablet, Take 1 tablet (600 mg total) by mouth every 6 (six) hours as needed. (Patient taking differently: Take 600 mg by mouth every 6 (six) hours as needed for moderate pain. ), Disp: 60 tablet, Rfl: 5 Levonorgestrel-Ethinyl Estradiol (AMETHIA,CAMRESE) 0.15-0.03 &0.01 MG tablet, Take 1 tablet by mouth daily., Disp: 1 Package, Rfl: 4;  norgestimate-ethinyl estradiol (ORTHO-CYCLEN,SPRINTEC,PREVIFEM) 0.25-35 MG-MCG tablet, Take 1 tablet by mouth daily. (Patient not taking: Reported on 06/08/2014), Disp: 1 Package, Rfl: 11;  omeprazole (PRILOSEC) 20 MG capsule, Take 20 mg by mouth daily as needed (Acid Reflux)., Disp: , Rfl:  ondansetron (ZOFRAN) 8 MG tablet, Take 1 tablet (8 mg total) by mouth every 8 (eight) hours as needed for  nausea or vomiting., Disp: 12 tablet, Rfl: 0;  oxyCODONE-acetaminophen (PERCOCET/ROXICET) 5-325 MG per tablet, Take 1 tablet by mouth every 4 (four) hours as needed. (Patient not taking: Reported on 06/08/2014), Disp: 30 tablet, Rfl: 0 promethazine (PHENERGAN) 25 MG tablet, Take 1 tablet (25 mg total) by mouth every 6 (six) hours as needed for nausea. (Patient not taking: Reported on 06/08/2014), Disp: 30 tablet, Rfl: 0;  topiramate (TOPAMAX) 50 MG tablet, Take 50 mg by mouth 2 (two) times daily., Disp: , Rfl: ;  traMADol (ULTRAM) 50 MG tablet, Take 1 tablet (50 mg total) by mouth every 6 (six) hours as needed. (Patient not taking: Reported on 06/08/2014), Disp: 60 tablet, Rfl: 0   Social History: Reviewed -  reports that she has never smoked. She has never used smokeless tobacco.  Objective Findings:  Vitals: Blood pressure 120/80,  height 5\' 3"  (1.6 m), weight 181 lb (82.101 kg).  Physical Examination: General appearance - alert, well appearing, and in no distress, oriented to person, place, and time and normal appearing weight Mental status - alert, oriented to person, place, and time, normal mood, behavior, speech, dress, motor activity, and thought processes   Assessment & Plan:   A:  1. Minimal lower abddiscomfort 2. Wt loss s/p gastic sleeve.   P:  1. followup prn. Renew tramadol prn

## 2014-07-19 ENCOUNTER — Other Ambulatory Visit: Payer: BLUE CROSS/BLUE SHIELD

## 2014-07-20 ENCOUNTER — Encounter: Payer: BLUE CROSS/BLUE SHIELD | Admitting: Obstetrics and Gynecology

## 2014-07-20 ENCOUNTER — Other Ambulatory Visit: Payer: BLUE CROSS/BLUE SHIELD

## 2014-08-20 ENCOUNTER — Encounter: Payer: Self-pay | Admitting: Nurse Practitioner

## 2014-08-20 ENCOUNTER — Ambulatory Visit (INDEPENDENT_AMBULATORY_CARE_PROVIDER_SITE_OTHER): Payer: BLUE CROSS/BLUE SHIELD | Admitting: Nurse Practitioner

## 2014-08-20 VITALS — BP 126/80 | Temp 97.7°F | Ht 60.0 in | Wt 164.4 lb

## 2014-08-20 DIAGNOSIS — J0120 Acute ethmoidal sinusitis, unspecified: Secondary | ICD-10-CM | POA: Diagnosis not present

## 2014-08-20 MED ORDER — METHYLPREDNISOLONE ACETATE 40 MG/ML IJ SUSP
40.0000 mg | Freq: Once | INTRAMUSCULAR | Status: AC
  Administered 2014-08-20: 40 mg via INTRAMUSCULAR

## 2014-08-20 MED ORDER — AMOXICILLIN-POT CLAVULANATE 875-125 MG PO TABS: 1.0000 | ORAL_TABLET | Freq: Two times a day (BID) | ORAL | Status: DC

## 2014-08-22 ENCOUNTER — Encounter: Payer: Self-pay | Admitting: Nurse Practitioner

## 2014-08-22 NOTE — Progress Notes (Signed)
Subjective:  Presents for runny nose, sore throat, headache, sneezing and cough x 3 days. No fever. Green yellow mucus. Cough mainly at night. Slight left ear pain. Ethmoid area headache. No wheezing.   Objective:   BP 126/80 mmHg  Temp(Src) 97.7 F (36.5 C) (Oral)  Ht 5' (1.524 m)  Wt 164 lb 6 oz (74.56 kg)  BMI 32.10 kg/m2 NAD. Alert, oriented. TMs retracted, no erythema. Pharynx mildly injected with PND noted. Neck supple with mild soft anterior adenopathy. Lungs clear. Heart regular rate rhythm.  Assessment: Acute ethmoidal sinusitis, recurrence not specified - Plan: methylPREDNISolone acetate (DEPO-MEDROL) injection 40 mg  Plan:  Meds ordered this encounter  Medications  . levonorgestrel-ethinyl estradiol (SEASONALE,INTROVALE,JOLESSA) 0.15-0.03 MG tablet    Sig: Take 1 tablet by mouth daily.  Marland Kitchen amoxicillin-clavulanate (AUGMENTIN) 875-125 MG per tablet    Sig: Take 1 tablet by mouth 2 (two) times daily.    Dispense:  20 tablet    Refill:  0    Order Specific Question:  Supervising Provider    Answer:  Mikey Kirschner [[id number]]  . methylPREDNISolone acetate (DEPO-MEDROL) injection 40 mg    Sig:    Restart Flonase and Zyrtec as directed. Call back if worsens or persists.

## 2014-11-10 ENCOUNTER — Other Ambulatory Visit: Payer: Self-pay | Admitting: Family Medicine

## 2014-11-13 NOTE — Telephone Encounter (Signed)
Needs office visit.

## 2014-11-26 ENCOUNTER — Emergency Department (HOSPITAL_COMMUNITY)
Admission: EM | Admit: 2014-11-26 | Discharge: 2014-11-27 | Disposition: A | Discharge status: Home / Self Care | Payer: BLUE CROSS/BLUE SHIELD | Attending: Emergency Medicine | Admitting: Emergency Medicine

## 2014-11-26 ENCOUNTER — Emergency Department (HOSPITAL_COMMUNITY): Payer: BLUE CROSS/BLUE SHIELD

## 2014-11-26 ENCOUNTER — Encounter (HOSPITAL_COMMUNITY): Payer: Self-pay | Admitting: *Deleted

## 2014-11-26 DIAGNOSIS — H9201 Otalgia, right ear: Secondary | ICD-10-CM | POA: Diagnosis not present

## 2014-11-26 DIAGNOSIS — Z7951 Long term (current) use of inhaled steroids: Secondary | ICD-10-CM | POA: Insufficient documentation

## 2014-11-26 DIAGNOSIS — Z8742 Personal history of other diseases of the female genital tract: Secondary | ICD-10-CM | POA: Diagnosis not present

## 2014-11-26 DIAGNOSIS — R112 Nausea with vomiting, unspecified: Secondary | ICD-10-CM

## 2014-11-26 DIAGNOSIS — I10 Essential (primary) hypertension: Secondary | ICD-10-CM | POA: Insufficient documentation

## 2014-11-26 DIAGNOSIS — Z8719 Personal history of other diseases of the digestive system: Secondary | ICD-10-CM | POA: Insufficient documentation

## 2014-11-26 DIAGNOSIS — R1031 Right lower quadrant pain: Secondary | ICD-10-CM | POA: Insufficient documentation

## 2014-11-26 DIAGNOSIS — G43009 Migraine without aura, not intractable, without status migrainosus: Secondary | ICD-10-CM | POA: Diagnosis not present

## 2014-11-26 DIAGNOSIS — Z792 Long term (current) use of antibiotics: Secondary | ICD-10-CM | POA: Diagnosis not present

## 2014-11-26 DIAGNOSIS — Z872 Personal history of diseases of the skin and subcutaneous tissue: Secondary | ICD-10-CM | POA: Diagnosis not present

## 2014-11-26 DIAGNOSIS — Z3202 Encounter for pregnancy test, result negative: Secondary | ICD-10-CM | POA: Diagnosis not present

## 2014-11-26 DIAGNOSIS — G8929 Other chronic pain: Secondary | ICD-10-CM | POA: Insufficient documentation

## 2014-11-26 DIAGNOSIS — Z79899 Other long term (current) drug therapy: Secondary | ICD-10-CM | POA: Insufficient documentation

## 2014-11-26 DIAGNOSIS — R197 Diarrhea, unspecified: Secondary | ICD-10-CM | POA: Diagnosis not present

## 2014-11-26 DIAGNOSIS — J45909 Unspecified asthma, uncomplicated: Secondary | ICD-10-CM | POA: Insufficient documentation

## 2014-11-26 LAB — CBC WITH DIFFERENTIAL/PLATELET
Basophils Absolute: 0.1 10*3/uL (ref 0.0–0.1)
Basophils Relative: 1 % (ref 0–1)
Eosinophils Absolute: 0.2 10*3/uL (ref 0.0–0.7)
Eosinophils Relative: 4 % (ref 0–5)
HCT: 38.7 % (ref 36.0–46.0)
Hemoglobin: 13 g/dL (ref 12.0–15.0)
Lymphocytes Relative: 59 % — ABNORMAL HIGH (ref 12–46)
Lymphs Abs: 3.6 10*3/uL (ref 0.7–4.0)
MCH: 29.3 pg (ref 26.0–34.0)
MCHC: 33.6 g/dL (ref 30.0–36.0)
MCV: 87.4 fL (ref 78.0–100.0)
Monocytes Absolute: 0.5 10*3/uL (ref 0.1–1.0)
Monocytes Relative: 8 % (ref 3–12)
Neutro Abs: 1.7 10*3/uL (ref 1.7–7.7)
Neutrophils Relative %: 28 % — ABNORMAL LOW (ref 43–77)
Platelets: 259 10*3/uL (ref 150–400)
RBC: 4.43 MIL/uL (ref 3.87–5.11)
RDW: 14.2 % (ref 11.5–15.5)
WBC: 6.1 10*3/uL (ref 4.0–10.5)

## 2014-11-26 LAB — LIPASE, BLOOD: Lipase: 20 U/L — ABNORMAL LOW (ref 22–51)

## 2014-11-26 LAB — HEPATIC FUNCTION PANEL
ALT: 19 U/L (ref 14–54)
AST: 21 U/L (ref 15–41)
Albumin: 4.2 g/dL (ref 3.5–5.0)
Alkaline Phosphatase: 47 U/L (ref 38–126)
Bilirubin, Direct: <0.1 mg/dL — ABNORMAL LOW (ref 0.1–0.5)
Total Bilirubin: 1.2 mg/dL (ref 0.3–1.2)
Total Protein: 7.2 g/dL (ref 6.5–8.1)

## 2014-11-26 LAB — URINALYSIS, ROUTINE W REFLEX MICROSCOPIC
Bilirubin Urine: NEGATIVE
Glucose, UA: NEGATIVE mg/dL
Hgb urine dipstick: NEGATIVE
Leukocytes, UA: NEGATIVE
Nitrite: NEGATIVE
Protein, ur: NEGATIVE mg/dL
Specific Gravity, Urine: >1.03 — ABNORMAL HIGH (ref 1.005–1.030)
Urobilinogen, UA: 0.2 mg/dL (ref 0.0–1.0)
pH: 5.5 (ref 5.0–8.0)

## 2014-11-26 LAB — BASIC METABOLIC PANEL
Anion gap: 7 (ref 5–15)
BUN: 9 mg/dL (ref 6–20)
CO2: 26 mmol/L (ref 22–32)
Calcium: 8.6 mg/dL — ABNORMAL LOW (ref 8.9–10.3)
Chloride: 104 mmol/L (ref 101–111)
Creatinine, Ser: 0.52 mg/dL (ref 0.44–1.00)
GFR calc Af Amer: >60 mL/min (ref >60)
GFR calc non Af Amer: >60 mL/min (ref >60)
Glucose, Bld: 82 mg/dL (ref 65–99)
Potassium: 3.5 mmol/L (ref 3.5–5.1)
Sodium: 137 mmol/L (ref 135–145)

## 2014-11-26 LAB — PREGNANCY, URINE: Preg Test, Ur: NEGATIVE

## 2014-11-26 MED ORDER — SODIUM CHLORIDE 0.9 % IV SOLN
INTRAVENOUS | Status: DC
  Administered 2014-11-26: 23:00:00 via INTRAVENOUS

## 2014-11-26 MED ORDER — MORPHINE SULFATE 4 MG/ML IJ SOLN
4.0000 mg | Freq: Once | INTRAMUSCULAR | Status: AC
  Administered 2014-11-26: 4 mg via INTRAVENOUS
  Filled 2014-11-26: qty 1

## 2014-11-26 MED ORDER — DICYCLOMINE HCL 20 MG PO TABS: 20.0000 mg | ORAL_TABLET | Freq: Three times a day (TID) | ORAL | Status: DC

## 2014-11-26 MED ORDER — ONDANSETRON 4 MG PO TBDP: 4.0000 mg | ORAL_TABLET | Freq: Three times a day (TID) | ORAL | Status: DC | PRN

## 2014-11-26 MED ORDER — ONDANSETRON HCL 4 MG/2ML IJ SOLN
4.0000 mg | Freq: Once | INTRAMUSCULAR | Status: AC
  Administered 2014-11-26: 4 mg via INTRAVENOUS
  Filled 2014-11-26: qty 2

## 2014-11-26 MED ORDER — IOHEXOL 300 MG/ML  SOLN
25.0000 mL | Freq: Once | INTRAMUSCULAR | Status: AC | PRN
  Administered 2014-11-26: 25 mL via ORAL

## 2014-11-26 MED ORDER — DICYCLOMINE HCL 10 MG/ML IM SOLN
20.0000 mg | Freq: Once | INTRAMUSCULAR | Status: AC
  Administered 2014-11-26: 20 mg via INTRAMUSCULAR
  Filled 2014-11-26: qty 2

## 2014-11-26 MED ORDER — IOHEXOL 300 MG/ML  SOLN
100.0000 mL | Freq: Once | INTRAMUSCULAR | Status: AC | PRN
  Administered 2014-11-26: 100 mL via INTRAVENOUS

## 2014-11-26 NOTE — ED Notes (Signed)
Pt has multiple complaints; pt c/o n/v/d with right ear pain and generalized weakness

## 2014-11-26 NOTE — ED Provider Notes (Signed)
TIME SEEN: 9:43 PM   CHIEF COMPLAINT: Emesis  HPI: HPI Comments: Jasmin Chen is a 34 y.o. female who presents to the Emergency Department complaining of vomiting onset today. Pt states she has had associated abdominal pain, diarrhea, decreased appetite and HA for the past 2 days. Pt states he feels as if there is a "knot" in her stomach.  Pt is also complaining of aching right ear pain. Pt denies any recent sick contacts. Pt denies any rect travel outside of the country. Pt reports Hx of hysterectomy and gastric sleeve. Pt denies fever, hematuria or dysuria. No vaginal bleeding or discharge.    ROS: See HPI Constitutional: no fever  Eyes: no drainage  ENT: no runny nose   Cardiovascular:  no chest pain  Resp: no SOB  GI: Abdominal Pain, Vomiting and Diarrhea  GU: no dysuria Integumentary: no rash  Allergy: no hives  Musculoskeletal: no leg swelling  Neurological: no slurred speech ROS otherwise negative  PAST MEDICAL HISTORY/PAST SURGICAL HISTORY:  Past Medical History  Diagnosis Date  . Asthma   . Multiple environmental allergies   . IBS (irritable bowel syndrome)   . Chronic rhinitis   . Migraine without aura 2009  . Hidradenitis suppurativa   . Hypertension   . Other and unspecified ovarian cyst 02/06/2013  . Chronic knee pain   . Right leg weakness     chronic  . Ovarian cyst     MEDICATIONS:  Prior to Admission medications   Medication Sig Start Date End Date Taking? Authorizing Provider  albuterol (PROVENTIL HFA;VENTOLIN HFA) 108 (90 BASE) MCG/ACT inhaler Inhale 2 puffs into the lungs every 4 (four) hours as needed for wheezing or shortness of breath.     Historical Provider, MD  albuterol (PROVENTIL) (2.5 MG/3ML) 0.083% nebulizer solution Take 2.5 mg by nebulization every 4 (four) hours as needed for wheezing or shortness of breath.     Historical Provider, MD  ALPRAZolam Duanne Moron) 0.25 MG tablet Take 0.125-0.25 mg by mouth at bedtime as needed for anxiety.     Historical Provider, MD  amoxicillin-clavulanate (AUGMENTIN) 875-125 MG per tablet Take 1 tablet by mouth 2 (two) times daily. 08/20/14   Nilda Simmer, NP  docusate sodium (COLACE) 100 MG capsule Take 1 capsule (100 mg total) by mouth 2 (two) times daily as needed. Patient taking differently: Take 100 mg by mouth 2 (two) times daily as needed for mild constipation.  03/13/14   Jonnie Kind, MD  fluticasone (FLONASE) 50 MCG/ACT nasal spray Place 2 sprays into both nostrils daily.  06/21/13   Historical Provider, MD  ibuprofen (ADVIL,MOTRIN) 600 MG tablet Take 1 tablet (600 mg total) by mouth every 6 (six) hours as needed. Patient taking differently: Take 600 mg by mouth every 6 (six) hours as needed for moderate pain.  09/26/13   Carole Civil, MD  levonorgestrel-ethinyl estradiol (SEASONALE,INTROVALE,JOLESSA) 0.15-0.03 MG tablet Take 1 tablet by mouth daily.    Historical Provider, MD  omeprazole (PRILOSEC) 20 MG capsule Take 20 mg by mouth daily as needed (Acid Reflux).    Historical Provider, MD  ondansetron (ZOFRAN) 8 MG tablet Take 1 tablet (8 mg total) by mouth every 8 (eight) hours as needed for nausea or vomiting. 06/08/14   Evalee Jefferson, PA-C  promethazine (PHENERGAN) 25 MG tablet Take 1 tablet (25 mg total) by mouth every 6 (six) hours as needed for nausea. 03/13/14   Jonnie Kind, MD  topiramate (TOPAMAX) 50 MG tablet Take 50  mg by mouth 2 (two) times daily.    Historical Provider, MD  traMADol (ULTRAM) 50 MG tablet Take 1 tablet (50 mg total) by mouth every 6 (six) hours as needed. 06/21/14   Jonnie Kind, MD  ZYRTEC ALLERGY 10 MG tablet TAKE 1 TABLET BY MOUTH EVERY DAY 11/13/14   Kathyrn Drown, MD    ALLERGIES:  Allergies  Allergen Reactions  . Prednisone Other (See Comments)    Difficulty concentrating and functioning    SOCIAL HISTORY:  History  Substance Use Topics  . Smoking status: Never Smoker   . Smokeless tobacco: Never Used  . Alcohol Use: No    FAMILY  HISTORY: Family History  Problem Relation Age of Onset  . Hypertension Mother   . Diabetes Mother   . Hyperlipidemia Mother   . Hypertension Father   . Hyperlipidemia Father   . Heart attack Father   . Sarcoidosis Sister   . Thyroid disease Sister   . Hyperlipidemia Sister   . Hypertension Sister     EXAM: BP 130/74 mmHg  Pulse 57  Temp(Src) 98.7 F (37.1 C) (Oral)  Resp 20  Ht 5' (1.524 m)  Wt 146 lb (66.225 kg)  BMI 28.51 kg/m2  SpO2 100%   CONSTITUTIONAL: Alert and oriented and responds appropriately to questions. Well-appearing; well-nourished, nontoxic-appearing, afebrile HEAD: Normocephalic EYES: Conjunctivae clear, PERRL ENT: normal nose; no rhinorrhea; moist mucous membranes; pharynx without lesions noted, TMs are clear bilaterally, no signs of otitis externa or mastoiditis NECK: Supple, no meningismus, no LAD  CARD: RRR; S1 and S2 appreciated; no murmurs, no clicks, no rubs, no gallops RESP: Normal chest excursion without splinting or tachypnea; breath sounds clear and equal bilaterally; no wheezes, no rhonchi, no rales, no hypoxia or respiratory distress, speaking full sentences ABD/GI: Normal bowel sounds; non-distended; soft, mild tenderness to palpation in RLQ, no rebound, no guarding, no peritoneal signs BACK:  The back appears normal and is non-tender to palpation, there is no CVA tenderness EXT: Normal ROM in all joints; non-tender to palpation; no edema; normal capillary refill; no cyanosis, no calf tenderness or swelling    SKIN: Normal color for age and race; warm NEURO: Moves all extremities equally, sensation to light touch intact diffusely, cranial nerves II through XII intact PSYCH: The patient's mood and manner are appropriate. Grooming and personal hygiene are appropriate.  MEDICAL DECISION MAKING: Patient here with complaints of nausea, vomiting and diarrhea. Minimally tender to palpation in the right lower quadrant. Will obtain abdominal labs, urine  and CT of her abdomen and pelvis.  ED PROGRESS: Patient's labs are unremarkable. Urine pregnancy negative. Urinalysis shows no sign of infection. CT scan shows nonspecific prominent lymph nodes in the right lower quadrant are likely reactive but a normal appendix. No adnexal mass. She has been able to drink without further vomiting. She reports feeling better but is still having mild cramping of her abdomen. We'll give dose of Bentyl. We'll discharge with Bentyl and Zofran. Have advised her to use Imodium as needed for her diarrhea. Suspect viral gastroenteritis. Discussed return precautions. She verbalized understanding and is comfortable with plan.     I personally performed the services described in this documentation, which was scribed in my presence. The recorded information has been reviewed and is accurate.      [place], DO 11/27/14 [phone]

## 2014-11-26 NOTE — Discharge Instructions (Signed)

## 2014-11-26 NOTE — ED Notes (Signed)
Patient transported to CT 

## 2014-11-27 ENCOUNTER — Encounter: Payer: Self-pay | Admitting: Family Medicine

## 2014-11-27 ENCOUNTER — Ambulatory Visit (INDEPENDENT_AMBULATORY_CARE_PROVIDER_SITE_OTHER): Payer: BLUE CROSS/BLUE SHIELD | Admitting: Family Medicine

## 2014-11-27 VITALS — BP 112/66 | Temp 98.2°F | Ht 60.0 in | Wt 148.0 lb

## 2014-11-27 DIAGNOSIS — R1031 Right lower quadrant pain: Secondary | ICD-10-CM

## 2014-11-27 DIAGNOSIS — B349 Viral infection, unspecified: Secondary | ICD-10-CM

## 2014-11-27 MED ORDER — HYDROCODONE-ACETAMINOPHEN 5-325 MG PO TABS: ORAL_TABLET | ORAL | Status: DC

## 2014-11-27 NOTE — Progress Notes (Signed)
Subjective:    Patient ID: Jasmin Chen, female    DOB: April 12, 1981, 34 y.o.   MRN: [id number] [facility] to [facility] ED yesterday.  Abdominal Pain This is a new problem. Episode onset: 3 days. Associated symptoms include diarrhea, headaches and vomiting. Associated symptoms comments: Ear pain. Treatments tried: aleve, alka-seltzer, zofran, bentyl.   Started getting sick sun  Headache diffuse   And freq diarrhea yest  Now having fullness and pain in stomach  Last time vomited yest before   Loose stools yest  Appetite not good today  Review of Systems  Gastrointestinal: Positive for vomiting, abdominal pain and diarrhea.  Neurological: Positive for headaches.       Objective:   Physical Exam  Alert moderate malaise. H&T normal lungs clear. Heart regular in rhythm. Abdomen hyperactive bowel sounds moderate right lower quadrant tenderness. No CVA tenderness.  Results for orders placed or performed during the hospital encounter of 11/26/14  CBC WITH DIFFERENTIAL  Result Value Ref Range   WBC 6.1 4.0 - 10.5 K/uL   RBC 4.43 3.87 - 5.11 MIL/uL   Hemoglobin 13.0 12.0 - 15.0 g/dL   HCT 38.7 36.0 - 46.0 %   MCV 87.4 78.0 - 100.0 fL   MCH 29.3 26.0 - 34.0 pg   MCHC 33.6 30.0 - 36.0 g/dL   RDW 14.2 11.5 - 15.5 %   Platelets 259 150 - 400 K/uL   Neutrophils Relative % 28 (L) 43 - 77 %   Neutro Abs 1.7 1.7 - 7.7 K/uL   Lymphocytes Relative 59 (H) 12 - 46 %   Lymphs Abs 3.6 0.7 - 4.0 K/uL   Monocytes Relative 8 3 - 12 %   Monocytes Absolute 0.5 0.1 - 1.0 K/uL   Eosinophils Relative 4 0 - 5 %   Eosinophils Absolute 0.2 0.0 - 0.7 K/uL   Basophils Relative 1 0 - 1 %   Basophils Absolute 0.1 0.0 - 0.1 K/uL  Basic metabolic panel  Result Value Ref Range   Sodium 137 135 - 145 mmol/L   Potassium 3.5 3.5 - 5.1 mmol/L   Chloride 104 101 - 111 mmol/L   CO2 26 22 - 32 mmol/L   Glucose, Bld 82 65 - 99 mg/dL   BUN 9 6 - 20 mg/dL   Creatinine, Ser 0.52 0.44 - 1.00 mg/dL   Calcium 8.6 (L)  8.9 - 10.3 mg/dL   GFR calc non Af Amer >60 >60 mL/min   GFR calc Af Amer >60 >60 mL/min   Anion gap 7 5 - 15  Pregnancy, urine  Result Value Ref Range   Preg Test, Ur NEGATIVE NEGATIVE  Urinalysis, Routine w reflex microscopic (not at [facility])  Result Value Ref Range   Color, Urine YELLOW YELLOW   APPearance CLEAR CLEAR   Specific Gravity, Urine >1.030 (H) 1.005 - 1.030   pH 5.5 5.0 - 8.0   Glucose, UA NEGATIVE NEGATIVE mg/dL   Hgb urine dipstick NEGATIVE NEGATIVE   Bilirubin Urine NEGATIVE NEGATIVE   Ketones, ur TRACE (A) NEGATIVE mg/dL   Protein, ur NEGATIVE NEGATIVE mg/dL   Urobilinogen, UA 0.2 0.0 - 1.0 mg/dL   Nitrite NEGATIVE NEGATIVE   Leukocytes, UA NEGATIVE NEGATIVE  Hepatic function panel  Result Value Ref Range   Total Protein 7.2 6.5 - 8.1 g/dL   Albumin 4.2 3.5 - 5.0 g/dL   AST 21 15 - 41 U/L   ALT 19 14 - 54 U/L   Alkaline Phosphatase 47 38 -  126 U/L   Total Bilirubin 1.2 0.3 - 1.2 mg/dL   Bilirubin, Direct <0.1 (L) 0.1 - 0.5 mg/dL   Indirect Bilirubin NOT CALCULATED 0.3 - 0.9 mg/dL  Lipase, blood  Result Value Ref Range   Lipase 20 (L) 22 - 51 U/L       Assessment & Plan:  Impression full hospital chart reviewed and presence of the patient. 25 minutes spent most in discussion. CT scan revealed reactive lymph nodes. Right lower quadrant. White blood count was normal. Distinct lymphocytosis. Description is viral nature. Hyperactive bowel sounds somewhat reassuring all discussed with family they were wondering whether she needs return to the emergency room. Symptom care discussed. Zofran when necessary. Expect slow resolution warning signs discussed carefully point WSL

## 2014-11-29 ENCOUNTER — Emergency Department (HOSPITAL_COMMUNITY)
Admission: EM | Admit: 2014-11-29 | Discharge: 2014-11-30 | Disposition: A | Discharge status: Home / Self Care | Payer: BLUE CROSS/BLUE SHIELD | Attending: Emergency Medicine | Admitting: Emergency Medicine

## 2014-11-29 ENCOUNTER — Encounter (HOSPITAL_COMMUNITY): Payer: Self-pay | Admitting: Emergency Medicine

## 2014-11-29 DIAGNOSIS — Z872 Personal history of diseases of the skin and subcutaneous tissue: Secondary | ICD-10-CM | POA: Diagnosis not present

## 2014-11-29 DIAGNOSIS — Z8742 Personal history of other diseases of the female genital tract: Secondary | ICD-10-CM | POA: Insufficient documentation

## 2014-11-29 DIAGNOSIS — R109 Unspecified abdominal pain: Secondary | ICD-10-CM | POA: Diagnosis not present

## 2014-11-29 DIAGNOSIS — R55 Syncope and collapse: Secondary | ICD-10-CM

## 2014-11-29 DIAGNOSIS — K589 Irritable bowel syndrome without diarrhea: Secondary | ICD-10-CM | POA: Diagnosis not present

## 2014-11-29 DIAGNOSIS — I10 Essential (primary) hypertension: Secondary | ICD-10-CM | POA: Diagnosis not present

## 2014-11-29 DIAGNOSIS — Z7951 Long term (current) use of inhaled steroids: Secondary | ICD-10-CM | POA: Insufficient documentation

## 2014-11-29 DIAGNOSIS — R1110 Vomiting, unspecified: Secondary | ICD-10-CM

## 2014-11-29 DIAGNOSIS — Z79899 Other long term (current) drug therapy: Secondary | ICD-10-CM | POA: Diagnosis not present

## 2014-11-29 DIAGNOSIS — R42 Dizziness and giddiness: Secondary | ICD-10-CM | POA: Insufficient documentation

## 2014-11-29 DIAGNOSIS — J45909 Unspecified asthma, uncomplicated: Secondary | ICD-10-CM | POA: Diagnosis not present

## 2014-11-29 DIAGNOSIS — G8929 Other chronic pain: Secondary | ICD-10-CM | POA: Insufficient documentation

## 2014-11-29 DIAGNOSIS — R197 Diarrhea, unspecified: Secondary | ICD-10-CM | POA: Insufficient documentation

## 2014-11-29 DIAGNOSIS — G43009 Migraine without aura, not intractable, without status migrainosus: Secondary | ICD-10-CM | POA: Diagnosis not present

## 2014-11-29 DIAGNOSIS — R112 Nausea with vomiting, unspecified: Secondary | ICD-10-CM | POA: Insufficient documentation

## 2014-11-29 LAB — COMPREHENSIVE METABOLIC PANEL
ALT: 25 U/L (ref 14–54)
AST: 22 U/L (ref 15–41)
Albumin: 3.7 g/dL (ref 3.5–5.0)
Alkaline Phosphatase: 42 U/L (ref 38–126)
Anion gap: 6 (ref 5–15)
BUN: 10 mg/dL (ref 6–20)
CO2: 26 mmol/L (ref 22–32)
Calcium: 8.2 mg/dL — ABNORMAL LOW (ref 8.9–10.3)
Chloride: 108 mmol/L (ref 101–111)
Creatinine, Ser: 0.58 mg/dL (ref 0.44–1.00)
GFR calc Af Amer: >60 mL/min (ref >60)
GFR calc non Af Amer: >60 mL/min (ref >60)
Glucose, Bld: 96 mg/dL (ref 65–99)
Potassium: 3.5 mmol/L (ref 3.5–5.1)
Sodium: 140 mmol/L (ref 135–145)
Total Bilirubin: 0.8 mg/dL (ref 0.3–1.2)
Total Protein: 6.5 g/dL (ref 6.5–8.1)

## 2014-11-29 LAB — CBC
HCT: 39 % (ref 36.0–46.0)
Hemoglobin: 13 g/dL (ref 12.0–15.0)
MCH: 29.2 pg (ref 26.0–34.0)
MCHC: 33.3 g/dL (ref 30.0–36.0)
MCV: 87.6 fL (ref 78.0–100.0)
Platelets: 234 10*3/uL (ref 150–400)
RBC: 4.45 MIL/uL (ref 3.87–5.11)
RDW: 14.3 % (ref 11.5–15.5)
WBC: 5.5 10*3/uL (ref 4.0–10.5)

## 2014-11-29 MED ORDER — SODIUM CHLORIDE 0.9 % IV BOLUS (SEPSIS)
1000.0000 mL | Freq: Once | INTRAVENOUS | Status: AC
Start: 2014-11-29 — End: 2014-11-29
  Administered 2014-11-29: 1000 mL via INTRAVENOUS

## 2014-11-29 MED ORDER — SODIUM CHLORIDE 0.9 % IV BOLUS (SEPSIS)
1000.0000 mL | Freq: Once | INTRAVENOUS | Status: AC
  Administered 2014-11-29: 1000 mL via INTRAVENOUS

## 2014-11-29 MED ORDER — ONDANSETRON HCL 4 MG/2ML IJ SOLN
4.0000 mg | Freq: Once | INTRAMUSCULAR | Status: AC
  Administered 2014-11-29: 4 mg via INTRAVENOUS
  Filled 2014-11-29: qty 2

## 2014-11-29 MED ORDER — MORPHINE SULFATE 4 MG/ML IJ SOLN
4.0000 mg | Freq: Once | INTRAMUSCULAR | Status: AC
  Administered 2014-11-29: 4 mg via INTRAVENOUS
  Filled 2014-11-29: qty 1

## 2014-11-29 NOTE — ED Notes (Signed)
Pt. Reports two syncopal episodes at home. Pt. C/o abdominal pain, nausea, vomiting, and diarrhea.

## 2014-11-29 NOTE — ED Provider Notes (Signed)
CSN: [id number]     Arrival date & time 11/29/14  2209 History  This chart was scribed for Jasmin Fraise, MD by Eustaquio Maize, ED Scribe. This patient was seen in room APA05/APA05 and the patient's care was started at 11:18 PM.    Chief Complaint  Patient presents with  . Loss of Consciousness   Patient is a 34 y.o. female presenting with syncope. The history is provided by the patient. No language interpreter was used.  Loss of Consciousness Episode history:  Multiple Most recent episode:  Today Chronicity:  New Context: normal activity   Witnessed: yes   Associated symptoms: headaches, nausea and vomiting   Associated symptoms: no chest pain, no fever and no seizures      HPI Comments: Jasmin Chen is a 34 y.o. female who presents to the Emergency Department complaining of 2 syncopal episodes that occurred today. Pt states that she was standing in her kitchen when she passed out. Mom states that pt woke up very quickly. Mom helped pt to her bedroom to lay down. Pt went to the bathroom shortly afterwards and had another syncopal episode. Denies any head injury or seizure like activity. Pt mentions that she felt lightheaded prior to passing out. Pt complains of lower abdominal pain right greater than left, headache, diarrhea, nausea, and vomiting x 5 days. Pt was seen in ED on Monday, 11/26/2014. She had CT A/P with no acute findings and was diagnosed with a viral infection. She was seen at PCP, Dr. Lance Sell, office on Tuesday, 11/27/2014, and was again told it was viral. Pt mentions that she has had 2 episodes of diarrhea and vomiting today. She also complains of chills and headache. Denies fever, chest pain, dysuria, vaginal pain, vaginal discharge, hematemesis, hematochezia.  No previous h/o syncope   Past Medical History  Diagnosis Date  . Asthma   . Multiple environmental allergies   . IBS (irritable bowel syndrome)   . Chronic rhinitis   . Migraine without aura 2009  .  Hidradenitis suppurativa   . Hypertension   . Other and unspecified ovarian cyst 02/06/2013  . Chronic knee pain   . Right leg weakness     chronic  . Ovarian cyst    Past Surgical History  Procedure Laterality Date  . Deviated septum repair    . 2 cysts removed from arm    . Breast reduction surgery    . Abdominal hysterectomy    . Laparoscopic gastric sleeve resection     Family History  Problem Relation Age of Onset  . Hypertension Mother   . Diabetes Mother   . Hyperlipidemia Mother   . Hypertension Father   . Hyperlipidemia Father   . Heart attack Father   . Sarcoidosis Sister   . Thyroid disease Sister   . Hyperlipidemia Sister   . Hypertension Sister    History  Substance Use Topics  . Smoking status: Never Smoker   . Smokeless tobacco: Never Used  . Alcohol Use: No   OB History    No data available     Review of Systems  Constitutional: Positive for chills. Negative for fever.  Cardiovascular: Positive for syncope. Negative for chest pain.  Gastrointestinal: Positive for nausea, vomiting, abdominal pain and diarrhea.  Neurological: Positive for syncope, light-headedness and headaches. Negative for seizures.  All other systems reviewed and are negative.     Allergies  Prednisone  Home Medications   Prior to Admission medications   Medication Sig Start  Date End Date Taking? Authorizing Provider  albuterol (PROVENTIL HFA;VENTOLIN HFA) 108 (90 BASE) MCG/ACT inhaler Inhale 2 puffs into the lungs every 4 (four) hours as needed for wheezing or shortness of breath.     Historical Provider, MD  albuterol (PROVENTIL) (2.5 MG/3ML) 0.083% nebulizer solution Take 2.5 mg by nebulization every 4 (four) hours as needed for wheezing or shortness of breath.     Historical Provider, MD  ALPRAZolam Duanne Moron) 0.25 MG tablet Take 0.125-0.25 mg by mouth at bedtime as needed for anxiety.    Historical Provider, MD  dicyclomine (BENTYL) 20 MG tablet Take 1 tablet (20 mg total)  by mouth 3 (three) times daily before meals. As needed for abdominal cramps 11/26/14   Kristen N Ward, DO  docusate sodium (COLACE) 100 MG capsule Take 1 capsule (100 mg total) by mouth 2 (two) times daily as needed. Patient taking differently: Take 100 mg by mouth 2 (two) times daily as needed for mild constipation.  03/13/14   Jonnie Kind, MD  fluticasone (FLONASE) 50 MCG/ACT nasal spray Place 2 sprays into both nostrils daily.  06/21/13   Historical Provider, MD  HYDROcodone-acetaminophen (NORCO/VICODIN) 5-325 MG per tablet Take 1 tablet every 4-6 hrs as needed for pain 11/27/14   Mikey Kirschner, MD  omeprazole (PRILOSEC) 20 MG capsule Take 20 mg by mouth daily as needed (Acid Reflux).    Historical Provider, MD  ondansetron (ZOFRAN ODT) 4 MG disintegrating tablet Take 1 tablet (4 mg total) by mouth every 8 (eight) hours as needed for nausea or vomiting. 11/26/14   Kristen N Ward, DO  topiramate (TOPAMAX) 50 MG tablet Take 50 mg by mouth 2 (two) times daily.    Historical Provider, MD  ZYRTEC ALLERGY 10 MG tablet TAKE 1 TABLET BY MOUTH EVERY DAY 11/13/14   Kathyrn Drown, MD   Triage Vitals: BP 95/55 mmHg  Pulse 78  Temp(Src) 98.1 F (36.7 C) (Oral)  Resp 18  Ht 5' (1.524 m)  Wt 148 lb (67.132 kg)  BMI 28.90 kg/m2  SpO2 100%   Physical Exam  Nursing note and vitals reviewed. CONSTITUTIONAL: Well developed/well nourished HEAD: Normocephalic/atraumatic EYES: EOMI/PERRL ENMT: Mucous membranes moist NECK: supple no meningeal signs SPINE/BACK:entire spine nontender CV: S1/S2 noted, no murmurs/rubs/gallops noted LUNGS: Lungs are clear to auscultation bilaterally, no apparent distress ABDOMEN: soft, mild RLQ and LLQ tenderness, no rebound or guarding, bowel sounds noted throughout abdomen GU:no cva tenderness NEURO: Pt is awake/alert/appropriate, moves all extremitiesx4.  No facial droop.   EXTREMITIES: pulses normal/equal, full ROM SKIN: warm, color normal PSYCH: no abnormalities of  mood noted, alert and oriented to situation    ED Course  Procedures  DIAGNOSTIC STUDIES: Oxygen Saturation is 100% on RA, normal by my interpretation.    COORDINATION OF CARE: 11:26 PM-Discussed treatment plan which includes CBC, CMP, UA with pt at bedside and pt agreed to plan.    Pt well appearing She is improved She ambulated Labs unchanged She already had negative CT imaging earlier in the week, normal appendix On repeat exam no focal abd tenderness - I doubt occult appendicitis or other acute abdominal/GYN emergency.  She is well appearing As for her syncope, suspect due to recent volume loss/GI symptoms EKG unremarkable We discussed strict return precautions Pt agreeable with plan and feels comfortable for d/c home  Labs Review Labs Reviewed  COMPREHENSIVE METABOLIC PANEL - Abnormal; Notable for the following:    Calcium 8.2 (*)    All other components within  normal limits  CBC  URINALYSIS, ROUTINE W REFLEX MICROSCOPIC (NOT AT [facility])     EKG Interpretation   Date/Time:  Thursday November 29 2014 23:16:37 EDT Ventricular Rate:  56 PR Interval:  176 QRS Duration: 68 QT Interval:  436 QTC Calculation: 421 R Axis:   60 Text Interpretation:  Sinus rhythm Low voltage, precordial leads No  previous ECGs available Confirmed by Christy Gentles  MD, Yarixa Lightcap ([id number]) on  11/29/2014 11:56:56 PM     Medications  sodium chloride 0.9 % bolus 1,000 mL (0 mLs Intravenous Stopped 11/29/14 2334)  morphine 4 MG/ML injection 4 mg (4 mg Intravenous Given 11/29/14 2332)  ondansetron (ZOFRAN) injection 4 mg (4 mg Intravenous Given 11/29/14 2332)  sodium chloride 0.9 % bolus 1,000 mL (0 mLs Intravenous Stopped 11/30/14 0117)  ibuprofen (ADVIL,MOTRIN) tablet 800 mg (800 mg Oral Given 11/30/14 0122)    MDM   Final diagnoses:  Syncope, unspecified syncope type  Vomiting and diarrhea  Abdominal pain, unspecified abdominal location    Nursing notes including past medical history and social  history reviewed and considered in documentation Labs/vital reviewed myself and considered during evaluation Previous records reviewed and considered   I personally performed the services described in this documentation, which was scribed in my presence. The recorded information has been reviewed and is accurate.       Jasmin Fraise, MD 11/30/14 [phone]

## 2014-11-29 NOTE — ED Notes (Signed)
Patient ambulatory to restroom to collect urine sample. Mother with patient for assistance

## 2014-11-30 LAB — URINALYSIS, ROUTINE W REFLEX MICROSCOPIC
Bilirubin Urine: NEGATIVE
Glucose, UA: NEGATIVE mg/dL
Hgb urine dipstick: NEGATIVE
Ketones, ur: NEGATIVE mg/dL
Leukocytes, UA: NEGATIVE
Nitrite: NEGATIVE
Protein, ur: NEGATIVE mg/dL
Specific Gravity, Urine: 1.01 (ref 1.005–1.030)
Urobilinogen, UA: 0.2 mg/dL (ref 0.0–1.0)
pH: 7 (ref 5.0–8.0)

## 2014-11-30 MED ORDER — IBUPROFEN 800 MG PO TABS
800.0000 mg | ORAL_TABLET | Freq: Once | ORAL | Status: AC
  Administered 2014-11-30: 800 mg via ORAL
  Filled 2014-11-30: qty 1

## 2014-11-30 NOTE — Discharge Instructions (Signed)
°  SEEK IMMEDIATE MEDICAL ATTENTION IF: The pain does not go away or becomes severe, particularly over the next 8-12 hours.  Repeated vomiting occurs (multiple episodes).  Blood is being passed in stools or vomit (bright red or black tarry stools).  Return also if you develop chest pain, difficulty breathing, dizziness or fainting, or become confused, poorly responsive, or inconsolable.

## 2014-11-30 NOTE — ED Notes (Signed)
Patient verbalizes understanding of discharge instructions, prescription medications, home care and follow up care. Patient ambulatory out of department with family at this time.

## 2014-12-07 ENCOUNTER — Other Ambulatory Visit: Payer: Self-pay | Admitting: Family Medicine

## 2014-12-07 NOTE — Telephone Encounter (Signed)
Needs office visit.

## 2015-01-11 ENCOUNTER — Encounter: Payer: Self-pay | Admitting: Family Medicine

## 2015-01-11 ENCOUNTER — Ambulatory Visit (INDEPENDENT_AMBULATORY_CARE_PROVIDER_SITE_OTHER): Payer: BLUE CROSS/BLUE SHIELD | Admitting: Family Medicine

## 2015-01-11 VITALS — BP 110/72 | Ht 60.0 in | Wt 152.8 lb

## 2015-01-11 DIAGNOSIS — M779 Enthesopathy, unspecified: Secondary | ICD-10-CM | POA: Diagnosis not present

## 2015-01-11 DIAGNOSIS — M778 Other enthesopathies, not elsewhere classified: Secondary | ICD-10-CM

## 2015-01-11 MED ORDER — ETODOLAC 400 MG PO TABS: 400.0000 mg | ORAL_TABLET | Freq: Two times a day (BID) | ORAL | Status: DC

## 2015-01-11 NOTE — Progress Notes (Signed)
   Subjective:    Patient ID: Jasmin Chen, female    DOB: 1980/08/12, 34 y.o.   MRN: [id number]  HPI Patient arrives with right middle finger pain and swelling since Monday-no known injury.  Patient notes significant pain and swelling middle finger. Does not recall an injury. No fever chills. No drainage. 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 No history of hand troubles.   Review of Systems No headache no chest pain no shortness of breath    Objective:   Physical Exam  Alert vitals stable no acute distress lungs clear heart regular rhythm right dorsal proximal finger diffuse mild swelling and tenderness      Assessment & Plan:  Impression focal tendinitis/T no synovitis plan and I informed her medicine prescribed. Cut back on data injury of possible. WSL

## 2015-01-13 ENCOUNTER — Emergency Department (HOSPITAL_COMMUNITY)
Admission: EM | Admit: 2015-01-13 | Discharge: 2015-01-13 | Disposition: A | Discharge status: Home / Self Care | Payer: BLUE CROSS/BLUE SHIELD | Attending: Emergency Medicine | Admitting: Emergency Medicine

## 2015-01-13 ENCOUNTER — Emergency Department (HOSPITAL_COMMUNITY): Payer: BLUE CROSS/BLUE SHIELD

## 2015-01-13 ENCOUNTER — Encounter (HOSPITAL_COMMUNITY): Payer: Self-pay | Admitting: Emergency Medicine

## 2015-01-13 DIAGNOSIS — G8929 Other chronic pain: Secondary | ICD-10-CM | POA: Diagnosis not present

## 2015-01-13 DIAGNOSIS — Z79899 Other long term (current) drug therapy: Secondary | ICD-10-CM | POA: Diagnosis not present

## 2015-01-13 DIAGNOSIS — J45909 Unspecified asthma, uncomplicated: Secondary | ICD-10-CM | POA: Insufficient documentation

## 2015-01-13 DIAGNOSIS — K589 Irritable bowel syndrome without diarrhea: Secondary | ICD-10-CM | POA: Diagnosis not present

## 2015-01-13 DIAGNOSIS — Z8709 Personal history of other diseases of the respiratory system: Secondary | ICD-10-CM | POA: Diagnosis not present

## 2015-01-13 DIAGNOSIS — I10 Essential (primary) hypertension: Secondary | ICD-10-CM | POA: Diagnosis not present

## 2015-01-13 DIAGNOSIS — Z872 Personal history of diseases of the skin and subcutaneous tissue: Secondary | ICD-10-CM | POA: Diagnosis not present

## 2015-01-13 DIAGNOSIS — G43009 Migraine without aura, not intractable, without status migrainosus: Secondary | ICD-10-CM | POA: Insufficient documentation

## 2015-01-13 DIAGNOSIS — M79644 Pain in right finger(s): Secondary | ICD-10-CM | POA: Diagnosis present

## 2015-01-13 DIAGNOSIS — Z8742 Personal history of other diseases of the female genital tract: Secondary | ICD-10-CM | POA: Insufficient documentation

## 2015-01-13 DIAGNOSIS — Z791 Long term (current) use of non-steroidal anti-inflammatories (NSAID): Secondary | ICD-10-CM | POA: Insufficient documentation

## 2015-01-13 NOTE — ED Provider Notes (Signed)
History  This chart was scribed for non-physician practitioner, Quincy Carnes, PA-C,working with Ezequiel Essex, MD, by Marlowe Kays, ED Scribe. This patient was seen in room APFT24/APFT24 and the patient's care was started at 8:09 PM.  Chief Complaint  Patient presents with  . Hand Pain   The history is provided by the patient and medical records. No language interpreter was used.    HPI Comments:  Jasmin Chen is a 34 y.o. female who presents to the Emergency Department complaining of moderate pain of right third finger that began yesterday. She reports some mild swelling of the finger. She has not taken anything for pain. Moving the finger makes the pain worse. She denies alleviating factors. She denies inability or difficulty moving the finger, numbness, tingling or weakness of the right third finger or right hand, fever, chills, nausea, vomiting, bruising or wounds. She denies trauma or injury. Pt is right hand dominant. Denies any previous injuries or surgeries of the right hand or fingers.  Past Medical History  Diagnosis Date  . Asthma   . Multiple environmental allergies   . IBS (irritable bowel syndrome)   . Chronic rhinitis   . Migraine without aura 2009  . Hidradenitis suppurativa   . Hypertension   . Other and unspecified ovarian cyst 02/06/2013  . Chronic knee pain   . Right leg weakness     chronic  . Ovarian cyst    Past Surgical History  Procedure Laterality Date  . Deviated septum repair    . 2 cysts removed from arm    . Breast reduction surgery    . Abdominal hysterectomy    . Laparoscopic gastric sleeve resection    . Hemroid surgery     Family History  Problem Relation Age of Onset  . Hypertension Mother   . Diabetes Mother   . Hyperlipidemia Mother   . Hypertension Father   . Hyperlipidemia Father   . Heart attack Father   . Sarcoidosis Sister   . Thyroid disease Sister   . Hyperlipidemia Sister   . Hypertension Sister    History   Substance Use Topics  . Smoking status: Never Smoker   . Smokeless tobacco: Never Used  . Alcohol Use: No   OB History    No data available     Review of Systems  Constitutional: Negative for fever and chills.  Gastrointestinal: Negative for nausea and vomiting.  Musculoskeletal: Positive for arthralgias.  Skin: Negative for color change and wound.  Neurological: Negative for weakness and numbness.  All other systems reviewed and are negative.   Allergies  Prednisone  Home Medications   Prior to Admission medications   Medication Sig Start Date End Date Taking? Authorizing Provider  docusate sodium (COLACE) 100 MG capsule Take 1 capsule (100 mg total) by mouth 2 (two) times daily as needed. Patient taking differently: Take 100 mg by mouth 2 (two) times daily.  03/13/14  Yes Jonnie Kind, MD  etodolac (LODINE) 400 MG tablet Take 1 tablet (400 mg total) by mouth 2 (two) times daily. With food 01/11/15  Yes Mikey Kirschner, MD  fluticasone [facility]) 50 MCG/ACT nasal spray Place 2 sprays into both nostrils daily as needed for allergies.  06/21/13  Yes Historical Provider, MD  Multiple Vitamin (MULTIVITAMIN WITH MINERALS) TABS tablet Take 1 tablet by mouth daily.   Yes Historical Provider, MD  ZYRTEC ALLERGY 10 MG tablet TAKE 1 TABLET BY MOUTH EVERY DAY 12/07/14  Yes Scott A Roseland,  MD  albuterol (PROVENTIL HFA;VENTOLIN HFA) 108 (90 BASE) MCG/ACT inhaler Inhale 2 puffs into the lungs every 4 (four) hours as needed for wheezing or shortness of breath.     Historical Provider, MD  albuterol (PROVENTIL) (2.5 MG/3ML) 0.083% nebulizer solution Take 2.5 mg by nebulization every 4 (four) hours as needed for wheezing or shortness of breath.     Historical Provider, MD  ALPRAZolam Duanne Moron) 0.25 MG tablet Take 0.125-0.25 mg by mouth at bedtime as needed for anxiety.    Historical Provider, MD  dicyclomine (BENTYL) 20 MG tablet Take 1 tablet (20 mg total) by mouth 3 (three) times daily before  meals. As needed for abdominal cramps 11/26/14   Delice Bison Ward, DO  HYDROcodone-acetaminophen (NORCO/VICODIN) 5-325 MG per tablet Take 1 tablet every 4-6 hrs as needed for pain 11/27/14   Mikey Kirschner, MD  omeprazole (PRILOSEC) 20 MG capsule Take 20 mg by mouth daily as needed (Acid Reflux).    Historical Provider, MD  ondansetron (ZOFRAN ODT) 4 MG disintegrating tablet Take 1 tablet (4 mg total) by mouth every 8 (eight) hours as needed for nausea or vomiting. 11/26/14   Kristen N Ward, DO  topiramate (TOPAMAX) 50 MG tablet Take 50 mg by mouth 2 (two) times daily as needed (Headaches).     Historical Provider, MD   Triage Vitals: BP 116/72 mmHg  Pulse 77  Temp(Src) 98.2 F (36.8 C) (Oral)  Resp 20  Ht 5' (1.524 m)  Wt 151 lb (68.493 kg)  BMI 29.49 kg/m2  SpO2 98% Physical Exam  Constitutional: She is oriented to person, place, and time. She appears well-developed and well-nourished. No distress.  HENT:  Head: Normocephalic and atraumatic.  Mouth/Throat: Oropharynx is clear and moist.  Eyes: Conjunctivae and EOM are normal. Pupils are equal, round, and reactive to light.  Neck: Normal range of motion. Neck supple.  Cardiovascular: Normal rate, regular rhythm and normal heart sounds.   Pulmonary/Chest: Effort normal and breath sounds normal. No respiratory distress. She has no wheezes.  Musculoskeletal: Normal range of motion.       Right hand: She exhibits tenderness and bony tenderness. She exhibits normal capillary refill, no deformity, no laceration and no swelling. Normal sensation noted. Normal strength noted.       Hands: Right middle finger with tenderness noted to PIP joint; no acute deformities noted; limited flexion at PIP joint, full flexion at MCP and DIP joints; hand is NVI  Neurological: She is alert and oriented to person, place, and time.  Skin: Skin is warm and dry. She is not diaphoretic.  Psychiatric: She has a normal mood and affect.  Nursing note and vitals  reviewed.   ED Course  ORTHOPEDIC INJURY TREATMENT Date/Time: 01/13/2015 9:30 PM Performed by: Larene Pickett Authorized by: Larene Pickett Consent: Verbal consent obtained. Risks and benefits: risks, benefits and alternatives were discussed Consent given by: patient Patient understanding: patient states understanding of the procedure being performed Required items: required blood products, implants, devices, and special equipment available Patient identity confirmed: verbally with patient Injury location: finger Location details: right long finger Injury type: soft tissue Pre-procedure neurovascular assessment: neurovascularly intact Local anesthesia used: no Patient sedated: no Immobilization: splint Splint type: static finger Post-procedure neurovascular assessment: post-procedure neurovascularly intact Patient tolerance: Patient tolerated the procedure well with no immediate complications   (including critical care time) DIAGNOSTIC STUDIES: Oxygen Saturation is 98% on RA, normal by my interpretation.   COORDINATION OF CARE: 8:11 PM- Will X-Ray  right third finger. Pt verbalizes understanding and agrees to plan.  Medications - No data to display  Labs Review Labs Reviewed - No data to display  Imaging Review No results found.   EKG Interpretation None      MDM   Final diagnoses:  Pain of right middle finger   34 year old female with right middle finger pain at PIP joint. No reported injury or trauma. No deformity noted on exam. No outward signs of infection. Hand is neurovascular intact. X-ray obtained which is negative. Patient may have had some form of inadvertent injury, splint applied for comfort.  FU with PCP.  Encouraged supportive care with NSAIDs at home.  Discussed plan with patient, he/she acknowledged understanding and agreed with plan of care.  Return precautions given for new or worsening symptoms.  I personally performed the services described in  this documentation, which was scribed in my presence. The recorded information has been reviewed and is accurate.  Larene Pickett, PA-C 01/13/15 2132  Ezequiel Essex, MD 01/13/15 [phone]

## 2015-01-13 NOTE — ED Notes (Signed)
Pt. Reports pain/swelling to right middle finger. Pt. Denies any specific injury.

## 2015-01-13 NOTE — Discharge Instructions (Signed)
Take tylenol or motrin as needed for pain. Follow-up with your primary care physician. Return to the ED for new or worsening symptoms.

## 2015-02-13 ENCOUNTER — Encounter: Payer: Self-pay | Admitting: Family Medicine

## 2015-02-13 ENCOUNTER — Ambulatory Visit (INDEPENDENT_AMBULATORY_CARE_PROVIDER_SITE_OTHER): Payer: BLUE CROSS/BLUE SHIELD | Admitting: Family Medicine

## 2015-02-13 ENCOUNTER — Encounter (HOSPITAL_COMMUNITY): Payer: Self-pay | Admitting: Emergency Medicine

## 2015-02-13 ENCOUNTER — Emergency Department (HOSPITAL_COMMUNITY)
Admission: EM | Admit: 2015-02-13 | Discharge: 2015-02-13 | Disposition: A | Discharge status: Home / Self Care | Payer: BLUE CROSS/BLUE SHIELD | Attending: Emergency Medicine | Admitting: Emergency Medicine

## 2015-02-13 VITALS — BP 96/68 | Temp 98.4°F | Ht 60.0 in | Wt 143.0 lb

## 2015-02-13 DIAGNOSIS — R55 Syncope and collapse: Secondary | ICD-10-CM | POA: Diagnosis not present

## 2015-02-13 DIAGNOSIS — I10 Essential (primary) hypertension: Secondary | ICD-10-CM | POA: Insufficient documentation

## 2015-02-13 DIAGNOSIS — G43009 Migraine without aura, not intractable, without status migrainosus: Secondary | ICD-10-CM | POA: Insufficient documentation

## 2015-02-13 DIAGNOSIS — Z8742 Personal history of other diseases of the female genital tract: Secondary | ICD-10-CM | POA: Diagnosis not present

## 2015-02-13 DIAGNOSIS — Z8719 Personal history of other diseases of the digestive system: Secondary | ICD-10-CM | POA: Insufficient documentation

## 2015-02-13 DIAGNOSIS — M545 Low back pain, unspecified: Secondary | ICD-10-CM

## 2015-02-13 DIAGNOSIS — R42 Dizziness and giddiness: Secondary | ICD-10-CM | POA: Insufficient documentation

## 2015-02-13 DIAGNOSIS — G8929 Other chronic pain: Secondary | ICD-10-CM | POA: Insufficient documentation

## 2015-02-13 DIAGNOSIS — J45909 Unspecified asthma, uncomplicated: Secondary | ICD-10-CM | POA: Diagnosis not present

## 2015-02-13 DIAGNOSIS — Z79899 Other long term (current) drug therapy: Secondary | ICD-10-CM | POA: Diagnosis not present

## 2015-02-13 DIAGNOSIS — Z872 Personal history of diseases of the skin and subcutaneous tissue: Secondary | ICD-10-CM | POA: Diagnosis not present

## 2015-02-13 LAB — CBC WITH DIFFERENTIAL/PLATELET
Basophils Absolute: 0 10*3/uL (ref 0.0–0.1)
Basophils Relative: 0 % (ref 0–1)
Eosinophils Absolute: 0 10*3/uL (ref 0.0–0.7)
Eosinophils Relative: 1 % (ref 0–5)
HCT: 38.1 % (ref 36.0–46.0)
Hemoglobin: 12.6 g/dL (ref 12.0–15.0)
Lymphocytes Relative: 20 % (ref 12–46)
Lymphs Abs: 0.9 10*3/uL (ref 0.7–4.0)
MCH: 28.9 pg (ref 26.0–34.0)
MCHC: 33.1 g/dL (ref 30.0–36.0)
MCV: 87.4 fL (ref 78.0–100.0)
Monocytes Absolute: 0.3 10*3/uL (ref 0.1–1.0)
Monocytes Relative: 7 % (ref 3–12)
Neutro Abs: 3.1 10*3/uL (ref 1.7–7.7)
Neutrophils Relative %: 72 % (ref 43–77)
Platelets: 227 10*3/uL (ref 150–400)
RBC: 4.36 MIL/uL (ref 3.87–5.11)
RDW: 13.8 % (ref 11.5–15.5)
WBC: 4.3 10*3/uL (ref 4.0–10.5)

## 2015-02-13 LAB — COMPREHENSIVE METABOLIC PANEL
ALT: 8 U/L — ABNORMAL LOW (ref 14–54)
AST: 12 U/L — ABNORMAL LOW (ref 15–41)
Albumin: 3.5 g/dL (ref 3.5–5.0)
Alkaline Phosphatase: 42 U/L (ref 38–126)
Anion gap: 7 (ref 5–15)
BUN: 10 mg/dL (ref 6–20)
CO2: 23 mmol/L (ref 22–32)
Calcium: 7.8 mg/dL — ABNORMAL LOW (ref 8.9–10.3)
Chloride: 108 mmol/L (ref 101–111)
Creatinine, Ser: 0.52 mg/dL (ref 0.44–1.00)
GFR calc Af Amer: >60 mL/min (ref >60)
GFR calc non Af Amer: >60 mL/min (ref >60)
Glucose, Bld: 98 mg/dL (ref 65–99)
Potassium: 3.5 mmol/L (ref 3.5–5.1)
Sodium: 138 mmol/L (ref 135–145)
Total Bilirubin: 1.2 mg/dL (ref 0.3–1.2)
Total Protein: 6.3 g/dL — ABNORMAL LOW (ref 6.5–8.1)

## 2015-02-13 LAB — TROPONIN I: Troponin I: <0.03 ng/mL (ref <0.031)

## 2015-02-13 MED ORDER — HYDROCODONE-ACETAMINOPHEN 5-325 MG PO TABS
1.0000 | ORAL_TABLET | Freq: Once | ORAL | Status: AC
  Administered 2015-02-13: 1 via ORAL
  Filled 2015-02-13: qty 1

## 2015-02-13 MED ORDER — SODIUM CHLORIDE 0.9 % IV BOLUS (SEPSIS)
1000.0000 mL | Freq: Once | INTRAVENOUS | Status: AC
Start: 2015-02-13 — End: 2015-02-13
  Administered 2015-02-13: 1000 mL via INTRAVENOUS

## 2015-02-13 MED ORDER — DICLOFENAC SODIUM 75 MG PO TBEC: 75.0000 mg | DELAYED_RELEASE_TABLET | Freq: Two times a day (BID) | ORAL | Status: DC

## 2015-02-13 MED ORDER — CHLORZOXAZONE 500 MG PO TABS: 500.0000 mg | ORAL_TABLET | Freq: Four times a day (QID) | ORAL | Status: DC | PRN

## 2015-02-13 NOTE — ED Notes (Signed)
MD at bedside. 

## 2015-02-13 NOTE — Progress Notes (Signed)
   Subjective:    Patient ID: Jasmin Chen, female    DOB: [birth year], 34 y.o.   MRN: [id number]  Abdominal Pain This is a new problem. Episode onset: last night. Pain location: mid abdomen and around back on both sides. Treatments tried: tums, ibuprofen.    pain is worse after eating or drinking.    Lightheaded started today.   Pain is fairly severe in nature. Left lumbar region primarily. Worse with movement or motion.  Radiated around towards the front. Next  No vomiting no diarrhea.  Review of Systems  Gastrointestinal: Positive for abdominal pain.   no chest pain. Abdominal pain kicks in when she pivots or turns and the pain generally starts in the back     Objective:   Physical Exam  Alert mild malaise vitals stable no CVA tenderness no spinal tenderness positive left lumbar tenderness. Positive left lumbar pain with sitting up. Abdomen benign. Heart good lungs clear      Assessment & Plan:  Impression lumbar strain plan local measures discussed. Anti-inflammatory medicine muscle spasm medicine prescribed. Warning signs discussed WSL

## 2015-02-13 NOTE — ED Notes (Signed)
Seen at PCP today for back spasms, [facility] and had a syncopal episode. States she felt hot and nausea before passing out

## 2015-02-13 NOTE — Discharge Instructions (Signed)
Take your medicine as prescribed by dr. Wolfgang Phoenix.  Follow up with your md if not improving

## 2015-02-13 NOTE — ED Notes (Signed)
Informed patient not to drive. Verbalizes understanding.

## 2015-02-14 ENCOUNTER — Telehealth: Payer: Self-pay | Admitting: Family Medicine

## 2015-02-14 NOTE — Telephone Encounter (Signed)
That's a vasovagal or fainting response to pain and does not require follow up at this time, since just seen yest will require a few d to calm down the disomfort

## 2015-02-14 NOTE — Telephone Encounter (Signed)
pts mom calling stating that she [facility] yesterday and blacked out due to low BP, Brought by ambulance to [facility], where she was released later on. Did Blood work,  Gave her a pain pill, (not a script just one pill) IV fluids issued as well.   They were wondering if they needed to come back in here today to recheck, the ER  Did not say to follow up with pcp

## 2015-02-14 NOTE — Telephone Encounter (Signed)
ER note unavailable yet, nurses call pt get a thorough hx and ask what the er thought was going on, t

## 2015-02-14 NOTE — Telephone Encounter (Signed)
Mother states Jasmin Chen was in a lot of pain and was standing at the counter at pharmacy and got really nauseated and then her hands started tingling and next thing she knew people were over her trying to wake her up and they called 911 and she was taken by ambulance to [facility]. Er doctor gave her a pain pill and fluids-said her bp was low and she had not been eating and has hx of recent gastric sleeve less that a year. They ER advised her to eat more and drink more to help stabilize her low bp. Mother states patietn did eat a bit this am to take her meds.

## 2015-02-14 NOTE — Telephone Encounter (Signed)
Discussed with patient. Patient advised That's a vasovagal or fainting response to pain and does not require follow up at this time, since just seen yest will require a few days to calm down the discomfort. Follow up if ongoing problems. Patient verbalized understanding.

## 2015-02-15 NOTE — ED Provider Notes (Signed)
CSN: [id number]     Arrival date & time 02/13/15  1807 History   First MD Initiated Contact with Patient 02/13/15 1820     Chief Complaint  Patient presents with  . Loss of Consciousness  . Dizziness     (Consider location/radiation/quality/duration/timing/severity/associated sxs/prior Treatment) Patient is a 34 y.o. female presenting with syncope and dizziness. The history is provided by the patient (the pt was seen at md today and went to get med filled for back pain and passed out at pharmacy).  Loss of Consciousness Episode history:  Single Most recent episode:  Today Timing:  Rare Chronicity:  New Context: not blood draw   Associated symptoms: dizziness   Associated symptoms: no chest pain, no headaches and no seizures   Dizziness Associated symptoms: syncope   Associated symptoms: no chest pain, no diarrhea and no headaches     Past Medical History  Diagnosis Date  . Asthma   . Multiple environmental allergies   . IBS (irritable bowel syndrome)   . Chronic rhinitis   . Migraine without aura 2009  . Hidradenitis suppurativa   . Hypertension   . Other and unspecified ovarian cyst 02/06/2013  . Chronic knee pain   . Right leg weakness     chronic  . Ovarian cyst    Past Surgical History  Procedure Laterality Date  . Deviated septum repair    . 2 cysts removed from arm    . Breast reduction surgery    . Abdominal hysterectomy    . Laparoscopic gastric sleeve resection    . Hemroid surgery     Family History  Problem Relation Age of Onset  . Hypertension Mother   . Diabetes Mother   . Hyperlipidemia Mother   . Hypertension Father   . Hyperlipidemia Father   . Heart attack Father   . Sarcoidosis Sister   . Thyroid disease Sister   . Hyperlipidemia Sister   . Hypertension Sister    Social History  Substance Use Topics  . Smoking status: Never Smoker   . Smokeless tobacco: Never Used  . Alcohol Use: No   OB History    No data available     Review  of Systems  Constitutional: Negative for appetite change and fatigue.  HENT: Negative for congestion, ear discharge and sinus pressure.   Eyes: Negative for discharge.  Respiratory: Negative for cough.   Cardiovascular: Positive for syncope. Negative for chest pain.  Gastrointestinal: Negative for abdominal pain and diarrhea.  Genitourinary: Negative for frequency and hematuria.  Musculoskeletal: Negative for back pain.  Skin: Negative for rash.  Neurological: Positive for dizziness. Negative for seizures and headaches.  Psychiatric/Behavioral: Negative for hallucinations.      Allergies  Prednisone  Home Medications   Prior to Admission medications   Medication Sig Start Date End Date Taking? Authorizing Provider  albuterol (PROVENTIL HFA;VENTOLIN HFA) 108 (90 BASE) MCG/ACT inhaler Inhale 2 puffs into the lungs every 4 (four) hours as needed for wheezing or shortness of breath.    Yes Historical Provider, MD  albuterol (PROVENTIL) (2.5 MG/3ML) 0.083% nebulizer solution Take 2.5 mg by nebulization every 4 (four) hours as needed for wheezing or shortness of breath.    Yes Historical Provider, MD  ALPRAZolam (XANAX) 0.25 MG tablet Take 0.125-0.25 mg by mouth at bedtime as needed for anxiety.   Yes Historical Provider, MD  docusate sodium (COLACE) 100 MG capsule Take 1 capsule (100 mg total) by mouth 2 (two) times daily as needed.  Patient taking differently: Take 100 mg by mouth 2 (two) times daily.  03/13/14  Yes Jonnie Kind, MD  fluticasone (FLONASE) 50 MCG/ACT nasal spray Place 2 sprays into both nostrils daily as needed for allergies.  06/21/13  Yes Historical Provider, MD  Multiple Vitamin (MULTIVITAMIN WITH MINERALS) TABS tablet Take 1 tablet by mouth daily.   Yes Historical Provider, MD  omeprazole (PRILOSEC) 20 MG capsule Take 20 mg by mouth daily as needed (Acid Reflux).   Yes Historical Provider, MD  topiramate (TOPAMAX) 50 MG tablet Take 50 mg by mouth 2 (two) times daily as  needed (Headaches).    Yes Historical Provider, MD  ZYRTEC ALLERGY 10 MG tablet TAKE 1 TABLET BY MOUTH EVERY DAY 12/07/14  Yes Kathyrn Drown, MD  chlorzoxazone (PARAFON) 500 MG tablet Take 1 tablet (500 mg total) by mouth 4 (four) times daily as needed for muscle spasms. 02/13/15   Mikey Kirschner, MD  diclofenac (VOLTAREN) 75 MG EC tablet Take 1 tablet (75 mg total) by mouth 2 (two) times daily. 02/13/15   Mikey Kirschner, MD  etodolac (LODINE) 400 MG tablet Take 1 tablet (400 mg total) by mouth 2 (two) times daily. With food Patient not taking: Reported on 02/13/2015 01/11/15   Mikey Kirschner, MD   BP 107/79 mmHg  Pulse 77  Temp(Src) 98 F (36.7 C) (Oral)  Resp 21  Ht 5' (1.524 m)  Wt 143 lb (64.864 kg)  BMI 27.93 kg/m2  SpO2 98% Physical Exam  Constitutional: She is oriented to person, place, and time. She appears well-developed.  HENT:  Head: Normocephalic.  Eyes: Conjunctivae and EOM are normal. No scleral icterus.  Neck: Neck supple. No thyromegaly present.  Cardiovascular: Normal rate and regular rhythm.  Exam reveals no gallop and no friction rub.   No murmur heard. Pulmonary/Chest: No stridor. She has no wheezes. She has no rales. She exhibits no tenderness.  Abdominal: She exhibits no distension. There is no tenderness. There is no rebound.  Musculoskeletal: Normal range of motion. She exhibits no edema.  Lymphadenopathy:    She has no cervical adenopathy.  Neurological: She is oriented to person, place, and time. She exhibits normal muscle tone. Coordination normal.  Skin: No rash noted. No erythema.  Psychiatric: She has a normal mood and affect. Her behavior is normal.    ED Course  Procedures (including critical care time) Labs Review Labs Reviewed  COMPREHENSIVE METABOLIC PANEL - Abnormal; Notable for the following:    Calcium 7.8 (*)    Total Protein 6.3 (*)    AST 12 (*)    ALT 8 (*)    All other components within normal limits  CBC WITH  DIFFERENTIAL/PLATELET  TROPONIN I    Imaging Review No results found. I have personally reviewed and evaluated these images and lab results as part of my medical decision-making.   EKG Interpretation   Date/Time:  Wednesday February 13 2015 18:42:29 EDT Ventricular Rate:  74 PR Interval:  164 QRS Duration: 64 QT Interval:  370 QTC Calculation: 410 R Axis:   65 Text Interpretation:  Sinus arrhythmia Borderline ST elevation, inferior  leads Confirmed by Darvin Dials  MD, Ladarrious Kirksey [id number]) on 02/13/2015 8:38:36 PM      MDM   Final diagnoses:  Syncope, unspecified syncope type    Labs unremarkable,  Pt had not eaten anything all day,  Vasovagal response from back muscle strain and weakness from not eating.  Pt to follow up with pcp  as needed    Milton Ferguson, MD 02/15/15 1118

## 2015-02-25 ENCOUNTER — Other Ambulatory Visit: Payer: Self-pay | Admitting: Family Medicine

## 2015-03-14 ENCOUNTER — Ambulatory Visit: Payer: BLUE CROSS/BLUE SHIELD | Admitting: Family Medicine

## 2015-03-15 ENCOUNTER — Encounter: Payer: Self-pay | Admitting: Nurse Practitioner

## 2015-03-15 ENCOUNTER — Ambulatory Visit (INDEPENDENT_AMBULATORY_CARE_PROVIDER_SITE_OTHER): Payer: BLUE CROSS/BLUE SHIELD | Admitting: Nurse Practitioner

## 2015-03-15 VITALS — BP 110/70 | Temp 97.9°F | Ht 60.0 in | Wt 146.2 lb

## 2015-03-15 DIAGNOSIS — J329 Chronic sinusitis, unspecified: Secondary | ICD-10-CM | POA: Diagnosis not present

## 2015-03-15 MED ORDER — METHYLPREDNISOLONE ACETATE 40 MG/ML IJ SUSP
40.0000 mg | Freq: Once | INTRAMUSCULAR | Status: AC
  Administered 2015-03-15: 40 mg via INTRAMUSCULAR

## 2015-03-15 MED ORDER — AMOXICILLIN-POT CLAVULANATE 875-125 MG PO TABS: 1.0000 | ORAL_TABLET | Freq: Two times a day (BID) | ORAL | Status: DC

## 2015-03-15 NOTE — Progress Notes (Signed)
Subjective:   Presents complaints of sinus congestion intense sinus pressure over the past 2 weeks worse over the past few days. No fever. Slight sore throat. No ear pain or cough. No relief with steroid nasal spray and antihistamine.  Objective:   BP 110/70 mmHg  Temp(Src) 97.9 F (36.6 C) (Oral)  Ht 5' (1.524 m)  Wt 146 lb 4 oz (66.339 kg)  BMI 28.56 kg/m2  NAD. Alert, oriented. TMs clear effusion, no erythema. Obvious head congestion noted. Nasal mucosa pale and boggy almost to the point of complete occlusion bilateral. Pharynx injected with PND noted. Neck supple with mild soft anterior adenopathy. Lungs clear. Heart regular rate rhythm.  Assessment: Rhinosinusitis - Plan: methylPREDNISolone acetate (DEPO-MEDROL) injection 40 mg  Plan:  Meds ordered this encounter  Medications  . amoxicillin-clavulanate (AUGMENTIN) 875-125 MG tablet    Sig: Take 1 tablet by mouth 2 (two) times daily.    Dispense:  20 tablet    Refill:  0    Order Specific Question:  Supervising Provider    Answer:  Mikey Kirschner [[id number]]  . methylPREDNISolone acetate (DEPO-MEDROL) injection 40 mg    Sig:     continue nasal spray and antihistamine as directed. Call back in 72 hours if no improvement, will prescribe oral steroids at that time.

## 2015-03-25 ENCOUNTER — Ambulatory Visit (INDEPENDENT_AMBULATORY_CARE_PROVIDER_SITE_OTHER): Payer: BLUE CROSS/BLUE SHIELD | Admitting: Nurse Practitioner

## 2015-03-25 ENCOUNTER — Encounter: Payer: Self-pay | Admitting: Nurse Practitioner

## 2015-03-25 ENCOUNTER — Telehealth: Payer: Self-pay | Admitting: Nurse Practitioner

## 2015-03-25 ENCOUNTER — Other Ambulatory Visit: Payer: Self-pay | Admitting: Nurse Practitioner

## 2015-03-25 VITALS — BP 110/78 | Temp 98.5°F | Ht 60.0 in | Wt 146.4 lb

## 2015-03-25 DIAGNOSIS — J329 Chronic sinusitis, unspecified: Secondary | ICD-10-CM

## 2015-03-25 MED ORDER — METHYLPREDNISOLONE 4 MG PO TBPK: ORAL_TABLET | ORAL | Status: DC

## 2015-03-25 MED ORDER — METHYLPREDNISOLONE ACETATE 40 MG/ML IJ SUSP
40.0000 mg | Freq: Once | INTRAMUSCULAR | Status: AC
  Administered 2015-03-25: 40 mg via INTRAMUSCULAR

## 2015-03-25 MED ORDER — CEFDINIR 300 MG PO CAPS: 300.0000 mg | ORAL_CAPSULE | Freq: Two times a day (BID) | ORAL | Status: DC

## 2015-03-25 NOTE — Telephone Encounter (Signed)
Pt was seen earlier and states that Hoyle Sauer was going to send a cream in for her eczema. Pt states that the pharmacy has not received it yet.   [facility] [place]

## 2015-03-25 NOTE — Progress Notes (Signed)
Subjective:  Presents for c/o sinus symptoms that worsened over past 48 hours. Finishing up Augmentin for sinusitis; see previous note. Was doing better until then. Chills, no documented fever. Intense facial pressure. Occasional dry cough. Yellow mucus. Runny nose. Ear pain. Sore throat. No wheezing.   Objective:   BP 110/78 mmHg  Temp(Src) 98.5 F (36.9 C) (Oral)  Ht 5' (1.524 m)  Wt 146 lb 6.4 oz (66.407 kg)  BMI 28.59 kg/m2 NAD. Alert, oriented. TMs clear effusion. Pharynx moderate erythema with PND noted. Nasal mucosa pale and very boggy. Neck supple with mild anterior adenopathy. Lungs clear. Heart RRR.   Assessment: Rhinosinusitis - Plan: methylPREDNISolone acetate (DEPO-MEDROL) injection 40 mg  Plan:  Meds ordered this encounter  Medications  . methylPREDNISolone (MEDROL DOSEPAK) 4 MG TBPK tablet    Sig: Take as directed.    Dispense:  21 tablet    Refill:  0    Order Specific Question:  Supervising Provider    Answer:  Mikey Kirschner [[id number]]  . cefdinir (OMNICEF) 300 MG capsule    Sig: Take 1 capsule (300 mg total) by mouth 2 (two) times daily.    Dispense:  20 capsule    Refill:  0    Order Specific Question:  Supervising Provider    Answer:  Mikey Kirschner [[id number]]  . methylPREDNISolone acetate (DEPO-MEDROL) injection 40 mg    Sig:    Only has side effects with regular Prednisone. Given Rx for Medrol Dosepak to start in 48 hours if no improvement. Switch to [place]. Advised patient this is probably a superimposed viral illness but with recent sinusitis will treat again.  Return if symptoms worsen or fail to improve.

## 2015-03-25 NOTE — Progress Notes (Unsigned)
During office visit earlier, it was noted that patient had several dry hypopigmented areas

## 2015-03-26 ENCOUNTER — Other Ambulatory Visit: Payer: Self-pay | Admitting: Nurse Practitioner

## 2015-03-26 MED ORDER — TRIAMCINOLONE ACETONIDE 0.1 % EX OINT: 1.0000 "application " | TOPICAL_OINTMENT | Freq: Two times a day (BID) | CUTANEOUS | Status: DC

## 2015-03-26 NOTE — Telephone Encounter (Signed)
Pt called back today looking for the cream to be called it/

## 2015-06-11 ENCOUNTER — Encounter: Payer: Self-pay | Admitting: Family Medicine

## 2015-06-11 ENCOUNTER — Ambulatory Visit (INDEPENDENT_AMBULATORY_CARE_PROVIDER_SITE_OTHER): Payer: BLUE CROSS/BLUE SHIELD | Admitting: Family Medicine

## 2015-06-11 VITALS — BP 100/70 | Temp 98.5°F | Ht 60.0 in | Wt 153.2 lb

## 2015-06-11 DIAGNOSIS — J329 Chronic sinusitis, unspecified: Secondary | ICD-10-CM | POA: Diagnosis not present

## 2015-06-11 MED ORDER — AMOXICILLIN-POT CLAVULANATE 875-125 MG PO TABS: 1.0000 | ORAL_TABLET | Freq: Two times a day (BID) | ORAL | Status: AC

## 2015-06-11 MED ORDER — HYDROCODONE-HOMATROPINE 5-1.5 MG/5ML PO SYRP: 5.0000 mL | ORAL_SOLUTION | Freq: Every evening | ORAL | Status: DC | PRN

## 2015-06-11 NOTE — Progress Notes (Signed)
   Subjective:    Patient ID: Jasmin Chen, female    DOB: 1981/04/05, 34 y.o.   MRN: [id number]  Sinusitis This is a new problem. The current episode started in the past 7 days. The problem is unchanged. There has been no fever. The pain is moderate. Associated symptoms include congestion, coughing, headaches and a sore throat. (Body aches) Past treatments include oral decongestants. The treatment provided no relief.   Body aching abad last nite  Took nyquil  No fever,right sided ha , severe in nature, cough is prod yellow phlegm   Plus butning,  Cough very bad at night, throat inflammed in the morn, hoarse in the voice   Review of Systems  HENT: Positive for congestion and sore throat.   Respiratory: Positive for cough.   Neurological: Positive for headaches.       Objective:   Physical Exam  alert mild malaise hydration good H&T moderate his congestion frontal tenderness pharynx normal neck supple lungs bronchial cough no wheezes no crackles no tachypnea heart regular in rhythm.       Assessment & Plan:   impression post viral rhinosinusitis/bronchitis plan antibiotics prescribed. Symptom care discussed. Albuterol when necessary. Hycodan daily at bedtime when necessary warning signs discussed WSL

## 2015-06-15 ENCOUNTER — Encounter (HOSPITAL_COMMUNITY): Payer: Self-pay | Admitting: *Deleted

## 2015-06-15 ENCOUNTER — Emergency Department (HOSPITAL_COMMUNITY): Payer: BLUE CROSS/BLUE SHIELD

## 2015-06-15 ENCOUNTER — Emergency Department (HOSPITAL_COMMUNITY)
Admission: EM | Admit: 2015-06-15 | Discharge: 2015-06-15 | Disposition: A | Discharge status: Home / Self Care | Payer: BLUE CROSS/BLUE SHIELD | Attending: Emergency Medicine | Admitting: Emergency Medicine

## 2015-06-15 DIAGNOSIS — K589 Irritable bowel syndrome without diarrhea: Secondary | ICD-10-CM | POA: Diagnosis not present

## 2015-06-15 DIAGNOSIS — Z872 Personal history of diseases of the skin and subcutaneous tissue: Secondary | ICD-10-CM | POA: Insufficient documentation

## 2015-06-15 DIAGNOSIS — J209 Acute bronchitis, unspecified: Secondary | ICD-10-CM

## 2015-06-15 DIAGNOSIS — Z8742 Personal history of other diseases of the female genital tract: Secondary | ICD-10-CM | POA: Diagnosis not present

## 2015-06-15 DIAGNOSIS — Z791 Long term (current) use of non-steroidal anti-inflammatories (NSAID): Secondary | ICD-10-CM | POA: Insufficient documentation

## 2015-06-15 DIAGNOSIS — K920 Hematemesis: Secondary | ICD-10-CM | POA: Diagnosis present

## 2015-06-15 DIAGNOSIS — J45901 Unspecified asthma with (acute) exacerbation: Secondary | ICD-10-CM | POA: Insufficient documentation

## 2015-06-15 DIAGNOSIS — I10 Essential (primary) hypertension: Secondary | ICD-10-CM | POA: Insufficient documentation

## 2015-06-15 DIAGNOSIS — G8929 Other chronic pain: Secondary | ICD-10-CM | POA: Insufficient documentation

## 2015-06-15 DIAGNOSIS — G43009 Migraine without aura, not intractable, without status migrainosus: Secondary | ICD-10-CM | POA: Diagnosis not present

## 2015-06-15 DIAGNOSIS — Z79899 Other long term (current) drug therapy: Secondary | ICD-10-CM | POA: Diagnosis not present

## 2015-06-15 MED ORDER — IPRATROPIUM-ALBUTEROL 0.5-2.5 (3) MG/3ML IN SOLN
3.0000 mL | Freq: Once | RESPIRATORY_TRACT | Status: AC
  Administered 2015-06-15: 3 mL via RESPIRATORY_TRACT
  Filled 2015-06-15: qty 3

## 2015-06-15 NOTE — ED Provider Notes (Signed)
CSN: [id number]     Arrival date & time 06/15/15  0443 History   First MD Initiated Contact with Patient 06/15/15 [phone]     Chief Complaint  Patient presents with  . Hematemesis     Patient is a 34 y.o. female presenting with cough. The history is provided by the patient.  Cough Severity:  Moderate Onset quality:  Gradual Duration:  1 week Timing:  Intermittent Progression:  Worsening Chronicity:  New Smoker: no   Relieved by:  Nothing Worsened by:  Nothing tried Associated symptoms: chills, headaches, shortness of breath, sinus congestion and weight loss    Patient reports she has had cough for a week She reports sinus congestion She also reports feeling SOB with wheezing as she has h/o asthma She also noted over past several hours she has had blood tinged sputum  She has already seen PCP earlier this week and given cough meds and antibiotics  Pt denies h/o DVT/PE  She is a nonsmoker Past Medical History  Diagnosis Date  . Asthma   . Multiple environmental allergies   . IBS (irritable bowel syndrome)   . Chronic rhinitis   . Migraine without aura 2009  . Hidradenitis suppurativa   . Hypertension   . Other and unspecified ovarian cyst 02/06/2013  . Chronic knee pain   . Right leg weakness     chronic  . Ovarian cyst    Past Surgical History  Procedure Laterality Date  . Deviated septum repair    . 2 cysts removed from arm    . Breast reduction surgery    . Abdominal hysterectomy    . Laparoscopic gastric sleeve resection    . Hemroid surgery     Family History  Problem Relation Age of Onset  . Hypertension Mother   . Diabetes Mother   . Hyperlipidemia Mother   . Hypertension Father   . Hyperlipidemia Father   . Heart attack Father   . Sarcoidosis Sister   . Thyroid disease Sister   . Hyperlipidemia Sister   . Hypertension Sister    Social History  Substance Use Topics  . Smoking status: Never Smoker   . Smokeless tobacco: Never Used  . Alcohol  Use: No   OB History    No data available     Review of Systems  Constitutional: Positive for chills and weight loss.       Facial pain   Respiratory: Positive for cough and shortness of breath.   Gastrointestinal: Negative for vomiting.  Neurological: Positive for headaches.  All other systems reviewed and are negative.     Allergies  Prednisone  Home Medications   Prior to Admission medications   Medication Sig Start Date End Date Taking? Authorizing Provider  albuterol (PROVENTIL HFA;VENTOLIN HFA) 108 (90 BASE) MCG/ACT inhaler Inhale 2 puffs into the lungs every 4 (four) hours as needed for wheezing or shortness of breath.     Historical Provider, MD  albuterol (PROVENTIL) (2.5 MG/3ML) 0.083% nebulizer solution Take 2.5 mg by nebulization every 4 (four) hours as needed for wheezing or shortness of breath.     Historical Provider, MD  ALPRAZolam Duanne Moron) 0.25 MG tablet Take 0.125-0.25 mg by mouth at bedtime as needed for anxiety.    Historical Provider, MD  amoxicillin-clavulanate (AUGMENTIN) 875-125 MG tablet Take 1 tablet by mouth 2 (two) times daily. For 10 days 06/11/15 06/25/15  Mikey Kirschner, MD  chlorzoxazone (PARAFON) 500 MG tablet Take 1 tablet (500 mg total) by  mouth 4 (four) times daily as needed for muscle spasms. 02/13/15   Mikey Kirschner, MD  diclofenac (VOLTAREN) 75 MG EC tablet TAKE 1 TABLET (75 MG TOTAL) BY MOUTH 2 (TWO) TIMES DAILY. 02/25/15   Mikey Kirschner, MD  docusate sodium (COLACE) 100 MG capsule Take 1 capsule (100 mg total) by mouth 2 (two) times daily as needed. Patient taking differently: Take 100 mg by mouth 2 (two) times daily.  03/13/14   Jonnie Kind, MD  etodolac (LODINE) 400 MG tablet Take 1 tablet (400 mg total) by mouth 2 (two) times daily. With food 01/11/15   Mikey Kirschner, MD  fluticasone (FLONASE) 50 MCG/ACT nasal spray Place 2 sprays into both nostrils daily as needed for allergies.  06/21/13   Historical Provider, MD   HYDROcodone-homatropine (HYCODAN) 5-1.5 MG/5ML syrup Take 5 mLs by mouth at bedtime as needed for cough. 06/11/15   Mikey Kirschner, MD  Multiple Vitamin (MULTIVITAMIN WITH MINERALS) TABS tablet Take 1 tablet by mouth daily.    Historical Provider, MD  omeprazole (PRILOSEC) 20 MG capsule Take 20 mg by mouth daily as needed (Acid Reflux).    Historical Provider, MD  topiramate (TOPAMAX) 50 MG tablet Take 50 mg by mouth 2 (two) times daily as needed (Headaches).     Historical Provider, MD  triamcinolone ointment (KENALOG) 0.1 % Apply 1 application topically 2 (two) times daily. Prn rash up to 2 weeks 03/26/15   Nilda Simmer, NP  ZYRTEC ALLERGY 10 MG tablet TAKE 1 TABLET BY MOUTH EVERY DAY 12/07/14   Kathyrn Drown, MD   BP 110/77 mmHg  Pulse 74  Temp(Src) 97.7 F (36.5 C) (Oral)  Resp 18  Ht 5' (1.524 m)  Wt 69.4 kg  BMI 29.88 kg/m2  SpO2 98% Physical Exam CONSTITUTIONAL: Well developed/well nourished HEAD: Normocephalic/atraumatic EYES: EOMI/PERRL ENMT: Mucous membranes moist, uvula midline, no erythema/exudates NECK: supple no meningeal signs SPINE/BACK:entire spine nontender CV: S1/S2 noted, no murmurs/rubs/gallops noted LUNGS: minimal scattered wheeze, no distress noted.  Pt coughing frequently during exam ABDOMEN: soft, nontender, no rebound or guarding, bowel sounds noted throughout abdomen NEURO: Pt is awake/alert/appropriate, moves all extremitiesx4.  No facial droop.   EXTREMITIES: pulses normal/equal, full ROM SKIN: warm, color normal PSYCH: no abnormalities of mood noted, alert and oriented to situation  ED Course  Procedures  Medications  ipratropium-albuterol (DUONEB) 0.5-2.5 (3) MG/3ML nebulizer solution 3 mL (not administered)   6:14 AM Pt requesting CXR duoneb ordered 7:06 AM Pt reports feeling improved Given symptoms/history, I doubt this represents acute PE.  She should continue albuterol at home Stable for d/c home Imaging Review Dg Chest 2  View  06/15/2015  CLINICAL DATA:  Cough and chest congestion. EXAM: CHEST  2 VIEW COMPARISON:  04/05/2012 FINDINGS: There is slight peribronchial thickening visible on the lateral view. The lungs are otherwise clear. Heart size and vascularity are normal. No osseous abnormality. No effusions. IMPRESSION: Slight bronchitic changes. Electronically Signed   By: Lorriane Shire M.D.   On: 06/15/2015 07:17   I have personally reviewed and evaluated these images results as part of my medical decision-making.   MDM   Final diagnoses:  Acute bronchitis, unspecified organism    Nursing notes including past medical history and social history reviewed and considered in documentation xrays/imaging reviewed by myself and considered during evaluation Previous records reviewed and considered     [name], MD 06/15/15 [phone]

## 2015-06-15 NOTE — ED Notes (Addendum)
Pt states on Tuesday she was told she had bronchitis by her PCP. Pt was given amoxicillin and hydrocodone cough syrup to take at bedtime. Pt states she has been coughing up phlegm that is blood tinged. Pt also c/o a headache

## 2015-06-15 NOTE — ED Notes (Signed)
Called xray. Jasmin Chen stated that it would be about 6:45-6:50 before they could get pt for xray. Respiratory was also made aware that pt needed a breathing treatment

## 2015-06-15 NOTE — Discharge Instructions (Signed)

## 2015-07-12 DIAGNOSIS — R55 Syncope and collapse: Secondary | ICD-10-CM | POA: Insufficient documentation

## 2015-08-19 ENCOUNTER — Telehealth: Payer: Self-pay | Admitting: *Deleted

## 2015-08-19 ENCOUNTER — Other Ambulatory Visit: Payer: Self-pay | Admitting: *Deleted

## 2015-08-19 MED ORDER — CHLORZOXAZONE 500 MG PO TABS: 500.0000 mg | ORAL_TABLET | Freq: Three times a day (TID) | ORAL | Status: DC | PRN

## 2015-08-19 NOTE — Telephone Encounter (Signed)
Pt wants to try med first then will call back if not better for an appt. Med sent to pharm.

## 2015-08-19 NOTE — Telephone Encounter (Signed)
The patient does have options. We can try muscle relaxer Parafon forte, one 3 times a day when necessary spasms, #21, 1 refill I am fine with seeing the patient tomorrow morning or Wednesday if she would like.

## 2015-08-19 NOTE — Telephone Encounter (Signed)
Pt states she has been seen in the past for low back pain. Pain started back up on Thursday. Pain in low back. No dysuria or fever. Pt states the same pain she had in the past. Started last September. Wants appt for tomorrow or Wednesday or either a muscle relaxer called into [facility]. Taking ibuprofen for pain.

## 2015-09-20 ENCOUNTER — Ambulatory Visit (INDEPENDENT_AMBULATORY_CARE_PROVIDER_SITE_OTHER): Payer: BLUE CROSS/BLUE SHIELD | Admitting: Family Medicine

## 2015-09-20 ENCOUNTER — Encounter: Payer: Self-pay | Admitting: Family Medicine

## 2015-09-20 VITALS — BP 130/78 | Temp 98.0°F | Ht 60.0 in | Wt 155.0 lb

## 2015-09-20 DIAGNOSIS — J111 Influenza due to unidentified influenza virus with other respiratory manifestations: Secondary | ICD-10-CM | POA: Diagnosis not present

## 2015-09-20 MED ORDER — ALBUTEROL SULFATE (2.5 MG/3ML) 0.083% IN NEBU: 2.5000 mg | INHALATION_SOLUTION | RESPIRATORY_TRACT | Status: DC | PRN

## 2015-09-20 MED ORDER — OSELTAMIVIR PHOSPHATE 75 MG PO CAPS: 75.0000 mg | ORAL_CAPSULE | Freq: Two times a day (BID) | ORAL | Status: DC

## 2015-09-20 MED ORDER — ALBUTEROL SULFATE HFA 108 (90 BASE) MCG/ACT IN AERS: 2.0000 | INHALATION_SPRAY | RESPIRATORY_TRACT | Status: DC | PRN

## 2015-09-20 NOTE — Progress Notes (Signed)
   Subjective:    Patient ID: Jasmin Chen, female    DOB: 08-29-1980, 35 y.o.   MRN: [id number]  Cough This is a new problem. The current episode started today. Associated symptoms include ear pain, a fever, headaches, nasal congestion, a sore throat and wheezing. Treatments tried: tylenol.   Ok yesterday,  Hit hard this morn at work  Sinus pressure and head hurting and eyes hurting  And neck and shoulders  Throat sore,  Felt like fecver  Cough fine 24 hs ago Energy level zero appetite not good   Very sudden onset of symptomatology Review of Systems  Constitutional: Positive for fever.  HENT: Positive for ear pain and sore throat.   Respiratory: Positive for cough and wheezing.   Neurological: Positive for headaches.       Objective:   Physical Exam  Alert moderate malaise hydration good. HEENT moderate nasal congestion pharynx normal lungs intermittent cough no wheezes no crackles heart regular in rhythm.      Assessment & Plan:  Impression influenza discussed rationale discussed plan Tamiflu twice a day 5 days symptom care discussed warning signs discussed WSL

## 2015-09-25 ENCOUNTER — Other Ambulatory Visit: Payer: Self-pay | Admitting: *Deleted

## 2015-09-25 ENCOUNTER — Telehealth: Payer: Self-pay | Admitting: Family Medicine

## 2015-09-25 MED ORDER — CEFDINIR 300 MG PO CAPS: ORAL_CAPSULE | ORAL | Status: DC

## 2015-09-25 NOTE — Telephone Encounter (Signed)
Patient was dx with flu 09/20/15 and thinks she now needs antibiotic

## 2015-09-25 NOTE — Telephone Encounter (Signed)
Pts mom states she was told by Dr Nicki Reaper to call in if the pt was  Still having these symptoms and he would call something else in  Ear pain, diarrhea, neck & body aches, sore throat, abd cramps   [facility]

## 2015-09-25 NOTE — Telephone Encounter (Signed)
Please give work excuse for this week. Call pt when ready for pickup.

## 2015-09-25 NOTE — Telephone Encounter (Signed)
May give we for this week if needed- Omnicef 300 mg one bid 10 days-follow up if worse

## 2015-09-25 NOTE — Telephone Encounter (Signed)
W/E done upfront and ready for pick up

## 2015-09-25 NOTE — Telephone Encounter (Signed)
Med sent to pharm. Pt notified.  

## 2015-10-20 ENCOUNTER — Other Ambulatory Visit: Payer: Self-pay | Admitting: Family Medicine

## 2015-10-21 ENCOUNTER — Telehealth: Payer: Self-pay | Admitting: *Deleted

## 2015-10-21 NOTE — Telephone Encounter (Signed)
Patient called with complaint of rectal bleeding that started this weekend. Stated the bleeding is excessive and constant. Consult with Dr Nicki Reaper. Dr Nicki Reaper advised patient to go to Er for evaluation and treatment. Patient verbalized understanding.

## 2015-12-11 DIAGNOSIS — E669 Obesity, unspecified: Secondary | ICD-10-CM | POA: Diagnosis not present

## 2015-12-11 DIAGNOSIS — R635 Abnormal weight gain: Secondary | ICD-10-CM | POA: Diagnosis not present

## 2015-12-11 DIAGNOSIS — Z6831 Body mass index (BMI) 31.0-31.9, adult: Secondary | ICD-10-CM | POA: Diagnosis not present

## 2015-12-11 DIAGNOSIS — Z903 Acquired absence of stomach [part of]: Secondary | ICD-10-CM | POA: Diagnosis not present

## 2015-12-25 ENCOUNTER — Encounter: Payer: Self-pay | Admitting: Family Medicine

## 2015-12-25 ENCOUNTER — Ambulatory Visit (INDEPENDENT_AMBULATORY_CARE_PROVIDER_SITE_OTHER): Payer: BLUE CROSS/BLUE SHIELD | Admitting: Family Medicine

## 2015-12-25 VITALS — BP 122/80 | Temp 97.9°F | Ht 60.0 in | Wt 161.4 lb

## 2015-12-25 DIAGNOSIS — J329 Chronic sinusitis, unspecified: Secondary | ICD-10-CM | POA: Diagnosis not present

## 2015-12-25 MED ORDER — AMOXICILLIN-POT CLAVULANATE 875-125 MG PO TABS: 1.0000 | ORAL_TABLET | Freq: Two times a day (BID) | ORAL | Status: AC

## 2015-12-25 NOTE — Progress Notes (Signed)
   Subjective:    Patient ID: Jasmin Chen, female    DOB: 07/04/1980, 35 y.o.   MRN: [id number]  Sinusitis This is a new problem. The current episode started in the past 7 days. The problem is unchanged. There has been no fever. The pain is moderate. Associated symptoms include congestion, coughing, headaches and sinus pressure. Past treatments include oral decongestants. The treatment provided no relief.   Patient states that she has no other concerns at this time.   Pos cong in head and chest  Feels tight   Using zyrtec reg, takes nyquil cold and sinus  Review of Systems  HENT: Positive for congestion and sinus pressure.   Respiratory: Positive for cough.   Neurological: Positive for headaches.       Objective:   Physical Exam  Alert, mild malaise. Hydration good Vitals stable. frontal/ maxillary tenderness evident positive nasal congestion. pharynx normal neck supple  lungs clear/no crackles or wheezes. heart regular in rhythm       Assessment & Plan:  Impression rhinosinusitis likely post viral, discussed with patient. plan antibiotics prescribed. Questions answered. Symptomatic care discussed. warning signs discussed. WSL

## 2016-02-17 ENCOUNTER — Emergency Department (HOSPITAL_COMMUNITY)
Admission: EM | Admit: 2016-02-17 | Discharge: 2016-02-17 | Disposition: A | Discharge status: Home / Self Care | Payer: BLUE CROSS/BLUE SHIELD | Attending: Emergency Medicine | Admitting: Emergency Medicine

## 2016-02-17 ENCOUNTER — Encounter (HOSPITAL_COMMUNITY): Payer: Self-pay | Admitting: Emergency Medicine

## 2016-02-17 DIAGNOSIS — Y929 Unspecified place or not applicable: Secondary | ICD-10-CM | POA: Insufficient documentation

## 2016-02-17 DIAGNOSIS — S81851A Open bite, right lower leg, initial encounter: Secondary | ICD-10-CM | POA: Diagnosis not present

## 2016-02-17 DIAGNOSIS — Y999 Unspecified external cause status: Secondary | ICD-10-CM | POA: Insufficient documentation

## 2016-02-17 DIAGNOSIS — I10 Essential (primary) hypertension: Secondary | ICD-10-CM | POA: Insufficient documentation

## 2016-02-17 DIAGNOSIS — S91051A Open bite, right ankle, initial encounter: Secondary | ICD-10-CM | POA: Diagnosis not present

## 2016-02-17 DIAGNOSIS — S80861A Insect bite (nonvenomous), right lower leg, initial encounter: Secondary | ICD-10-CM | POA: Diagnosis not present

## 2016-02-17 DIAGNOSIS — S80862A Insect bite (nonvenomous), left lower leg, initial encounter: Secondary | ICD-10-CM | POA: Insufficient documentation

## 2016-02-17 DIAGNOSIS — Y939 Activity, unspecified: Secondary | ICD-10-CM | POA: Diagnosis not present

## 2016-02-17 DIAGNOSIS — J45909 Unspecified asthma, uncomplicated: Secondary | ICD-10-CM | POA: Diagnosis not present

## 2016-02-17 DIAGNOSIS — W57XXXA Bitten or stung by nonvenomous insect and other nonvenomous arthropods, initial encounter: Secondary | ICD-10-CM | POA: Diagnosis not present

## 2016-02-17 MED ORDER — HYDROXYZINE HCL 25 MG PO TABS: 25.0000 mg | ORAL_TABLET | Freq: Four times a day (QID) | ORAL | 0 refills | Status: DC

## 2016-02-17 MED ORDER — DOXYCYCLINE HYCLATE 100 MG PO TABS
100.0000 mg | ORAL_TABLET | Freq: Once | ORAL | Status: AC
  Administered 2016-02-17: 100 mg via ORAL
  Filled 2016-02-17: qty 1

## 2016-02-17 MED ORDER — DOXYCYCLINE HYCLATE 100 MG PO CAPS: 100.0000 mg | ORAL_CAPSULE | Freq: Two times a day (BID) | ORAL | 0 refills | Status: DC

## 2016-02-17 NOTE — Discharge Instructions (Signed)
Do not drive while taking the medication for itching as it can make you sleepy.

## 2016-02-17 NOTE — ED Triage Notes (Signed)
Pt. reports spider bite at right ankle with redness/itching , respirations unlabored /no oral swelling /

## 2016-02-17 NOTE — ED Provider Notes (Signed)
[facility] DEPT Provider Note   CSN: [id number] Arrival date & time: 02/17/16  2052   By signing my name below, I, Jasmin Chen, attest that this documentation has been prepared under the direction and in the presence of [place]. Janit Bern, NP. Electronically Signed: Estanislado Chen, Scribe. 02/17/2016. 10:33 PM.   History   Chief Complaint Chief Complaint  Patient presents with  . Insect Bite    The history is provided by the patient. No language interpreter was used.   HPI Comments:  Jasmin Chen is a 35 y.o. female who presents to the Emergency Department complaining of multiple insect bites sustained over the last week. Pt states that they are increasing in size and worsening in redness. Pt was in the [place] 1 week ago, went horseback riding, and sustained several mosquito bites. Pt also presents with what she believes to be insect bites with purulent drainage on her R ankle and R lower leg that appeared today. Pt states that the affected area are painful and itchy . Pt complains of associated nausea and headache.   Past Medical History:  Diagnosis Date  . Asthma   . Chronic knee pain   . Chronic rhinitis   . Hidradenitis suppurativa   . Hypertension   . IBS (irritable bowel syndrome)   . Migraine without aura 2009  . Multiple environmental allergies   . Other and unspecified ovarian cyst 02/06/2013  . Ovarian cyst   . Right leg weakness    chronic    Patient Active Problem List   Diagnosis Date Noted  . 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 Left ovarian cyst 03/11/2014  . Ovarian cyst 03/11/2014  . Knee pain 10/10/2013  . Stiffness of joint, not elsewhere classified, lower leg 10/10/2013  . Weakness of right leg 10/10/2013  . Other and unspecified ovarian cyst 03/20/2013  . Other and unspecified ovarian cyst 02/06/2013  . Other and unspecified ovarian cyst 02/06/2013  . IBS (irritable bowel syndrome) 02/03/2013  . Acid reflux 09/28/2012  . HLD  (hyperlipidemia) 09/28/2012  . Benign hypertension 09/28/2012  . Hidradenitis suppurativa 09/19/2012  . Morbid obesity ([place]) 09/19/2012  . RECTAL BLEEDING 08/20/2010  . ABDOMINAL PAIN-LLQ 08/20/2010    Past Surgical History:  Procedure Laterality Date  . 2 cysts removed from arm    . ABDOMINAL HYSTERECTOMY    . BREAST REDUCTION SURGERY    . deviated septum repair    . hemroid surgery    . LAPAROSCOPIC GASTRIC SLEEVE RESECTION      OB History    No data available       Home Medications    Prior to Admission medications   Medication Sig Start Date End Date Taking? Authorizing Provider  albuterol (PROVENTIL HFA;VENTOLIN HFA) 108 (90 Base) MCG/ACT inhaler Inhale 2 puffs into the lungs every 4 (four) hours as needed for wheezing or shortness of breath. 09/20/15   Mikey Kirschner, MD  albuterol (PROVENTIL) (2.5 MG/3ML) 0.083% nebulizer solution Take 3 mLs (2.5 mg total) by nebulization every 4 (four) hours as needed for wheezing or shortness of breath. 09/20/15   Mikey Kirschner, MD  ALPRAZolam Duanne Moron) 0.25 MG tablet Take 0.125-0.25 mg by mouth at bedtime as needed for anxiety.    Historical Provider, MD  docusate sodium (COLACE) 100 MG capsule Take 1 capsule (100 mg total) by mouth 2 (two) times daily as needed. Patient taking differently: Take 100 mg by mouth 2 (two) times daily.  03/13/14   Jonnie Kind,  MD  doxycycline (VIBRAMYCIN) 100 MG capsule Take 1 capsule (100 mg total) by mouth 2 (two) times daily. 02/17/16   Sagal Gayton Bunnie Pion, NP  fluticasone (FLONASE) 50 MCG/ACT nasal spray Place 2 sprays into both nostrils daily as needed for allergies.  06/21/13   Historical Provider, MD  hydrOXYzine (ATARAX/VISTARIL) 25 MG tablet Take 1 tablet (25 mg total) by mouth every 6 (six) hours. 02/17/16   Maze Corniel Bunnie Pion, NP  Multiple Vitamin (MULTIVITAMIN WITH MINERALS) TABS tablet Take 1 tablet by mouth daily.    Historical Provider, MD  omeprazole (PRILOSEC) 20 MG capsule Take 20 mg by mouth daily as  needed (Acid Reflux).    Historical Provider, MD  topiramate (TOPAMAX) 50 MG tablet Take 50 mg by mouth 2 (two) times daily as needed (Headaches).     Historical Provider, MD  triamcinolone ointment (KENALOG) 0.1 % Apply 1 application topically 2 (two) times daily. Prn rash up to 2 weeks 03/26/15   Nilda Simmer, NP  ZYRTEC ALLERGY 10 MG tablet TAKE 1 TABLET BY MOUTH EVERY DAY 10/21/15   Kathyrn Drown, MD    Family History Family History  Problem Relation Age of Onset  . Hypertension Mother   . Diabetes Mother   . Hyperlipidemia Mother   . Hypertension Father   . Hyperlipidemia Father   . Heart attack Father   . Sarcoidosis Sister   . Thyroid disease Sister   . Hyperlipidemia Sister   . Hypertension Sister     Social History Social History  Substance Use Topics  . Smoking status: Never Smoker  . Smokeless tobacco: Never Used  . Alcohol use No     Allergies   Prednisone   Review of Systems Review of Systems  Gastrointestinal: Positive for nausea.  Skin: Positive for color change and rash.  Neurological: Positive for headaches.  All other systems reviewed and are negative.    Physical Exam Updated Vital Signs BP 128/83   Pulse (!) 53   Temp 98.2 F (36.8 C) (Oral)   Resp 18   Ht 5' (1.524 m)   Wt 77.1 kg   SpO2 100%   BMI 33.20 kg/m   Physical Exam  Constitutional: She is oriented to person, place, and time. She appears well-developed and well-nourished. No distress.  HENT:  Head: Normocephalic and atraumatic.  Eyes: Conjunctivae are normal.  Cardiovascular: Normal rate.   Pulmonary/Chest: Effort normal.  Abdominal: She exhibits no distension.  Musculoskeletal: Normal range of motion.  Multiple small red areas to the left leg that are consistent with insect bites. Right lower leg with 3 small blister areas and the one at the ankle is ruptured and there is an area of purulent drainage. The right leg has mild erythema surrounding the blisters and there  is some swelling noted. Pedal pulses 2+, adequate circulation.   Neurological: She is alert and oriented to person, place, and time.  Skin: Skin is warm and dry. There is erythema.  2 blister areas to the lateral aspect of R lower leg. Pustular area with purulent drainage to lateral aspect on R ankle.  Swelling to R lower leg. Mild erythema and increased warmth.   Psychiatric: She has a normal mood and affect. Her behavior is normal.  Nursing note and vitals reviewed.    ED Treatments / Results  DIAGNOSTIC STUDIES:  Oxygen Saturation is 98% on RA, normal by my interpretation.    COORDINATION OF CARE:  10:33 PM Discussed treatment plan with pt at  bedside and pt agreed to plan.   Labs (all labs ordered are listed, but only abnormal results are displayed) Labs Reviewed  AEROBIC CULTURE (SUPERFICIAL SPECIMEN)    Radiology No results found.  Procedures Procedures (including critical care time)  Medications Ordered in ED Medications  doxycycline (VIBRA-TABS) tablet 100 mg (not administered)     Initial Impression / Assessment and Plan / ED Course  I have reviewed the triage vital signs and the nursing notes.   Clinical Course   Discussed with Dr. Laverta Baltimore and will start antibiotics and medication for itching and allergic reaction.  Final Clinical Impressions(s) / ED Diagnoses  35 y.o. female with right lower leg infection stable for d/c without fever and does not appear toxic. Discussed in detail plan of care and f/u with her PCP. Patient voices understanding and agrees with plan.   Final diagnoses:  Infected insect bite    New Prescriptions New Prescriptions   DOXYCYCLINE (VIBRAMYCIN) 100 MG CAPSULE    Take 1 capsule (100 mg total) by mouth 2 (two) times daily.   HYDROXYZINE (ATARAX/VISTARIL) 25 MG TABLET    Take 1 tablet (25 mg total) by mouth every 6 (six) hours.   I personally performed the services described in this documentation, which was scribed in my  presence. The recorded information has been reviewed and is accurate.     [id number] [place], NP 02/17/16 2318    Margette Fast, MD 02/18/16 [phone]

## 2016-02-19 ENCOUNTER — Encounter: Payer: Self-pay | Admitting: Family Medicine

## 2016-02-19 ENCOUNTER — Ambulatory Visit (INDEPENDENT_AMBULATORY_CARE_PROVIDER_SITE_OTHER): Payer: BLUE CROSS/BLUE SHIELD | Admitting: Family Medicine

## 2016-02-19 VITALS — BP 118/72 | Ht 60.0 in | Wt 174.2 lb

## 2016-02-19 DIAGNOSIS — L03115 Cellulitis of right lower limb: Secondary | ICD-10-CM | POA: Diagnosis not present

## 2016-02-19 MED ORDER — CLINDAMYCIN HCL 300 MG PO CAPS: 300.0000 mg | ORAL_CAPSULE | Freq: Three times a day (TID) | ORAL | 0 refills | Status: DC

## 2016-02-19 NOTE — Progress Notes (Signed)
   Subjective:    Patient ID: Jasmin Chen, female    DOB: [birth year], 35 y.o.   MRN: [id number]  HPI Patient arrives for a follow up from the ER for cellulitis on right leg. Patient was given doxycycline in Er-doing some better. Patient stated that started after getting mosquito bite while on a trip to [place] but she also states that when she shaved her leg got worse in seen to spread she showed me several pictures and she also went to the ER with this  Review of Systems Pain in the lower leg discomfort slight drainage from the folliculitis areas.    Objective:   Physical Exam Significant cellulitis lower leg tenderness pain discomfort. No other particular findings. No abscess seen.       Assessment & Plan:  Cellulitis lower leg multiple areas tenderness pain discomfort. No abscess noted. No significant swelling. Warm compresses on a frequent basis stop doxycycline because the nausea start clindamycin 3 times daily for 10 days if not improving over the next week to notify us

## 2016-02-20 LAB — AEROBIC CULTURE  (SUPERFICIAL SPECIMEN)

## 2016-02-20 LAB — AEROBIC CULTURE W GRAM STAIN (SUPERFICIAL SPECIMEN): Special Requests: NORMAL

## 2016-02-21 ENCOUNTER — Telehealth (HOSPITAL_BASED_OUTPATIENT_CLINIC_OR_DEPARTMENT_OTHER): Payer: Self-pay

## 2016-02-21 NOTE — Telephone Encounter (Signed)
Post ED Visit - Positive Culture Follow-up  Culture report reviewed by antimicrobial stewardship pharmacist:  []  Elenor Quinones, Pharm.D. []  Heide Guile, Pharm.D., BCPS []  Parks Neptune, Pharm.D. []  Alycia Rossetti, Pharm.D., BCPS []  [place], Pharm.D., BCPS, AAHIVP []  Legrand Como, Pharm.D., BCPS, AAHIVP []  Milus Glazier, Pharm.D. []  Rob [place], Pharm.DUvaldo Bristle Pharm Res Positive Aerobic superficial  culture Treated with Doxycycline, organism sensitive to the same and no further patient follow-up is required at this time.  Genia Del 02/21/2016, 9:44 AM

## 2016-03-07 IMAGING — DX DG CHEST 2V
2 series · 2 of 2 positions shown · non-contrast
Comparison: 04/05/2012

CLINICAL DATA: Cough and chest congestion.

EXAM:
CHEST  2 VIEW

[chest pa]
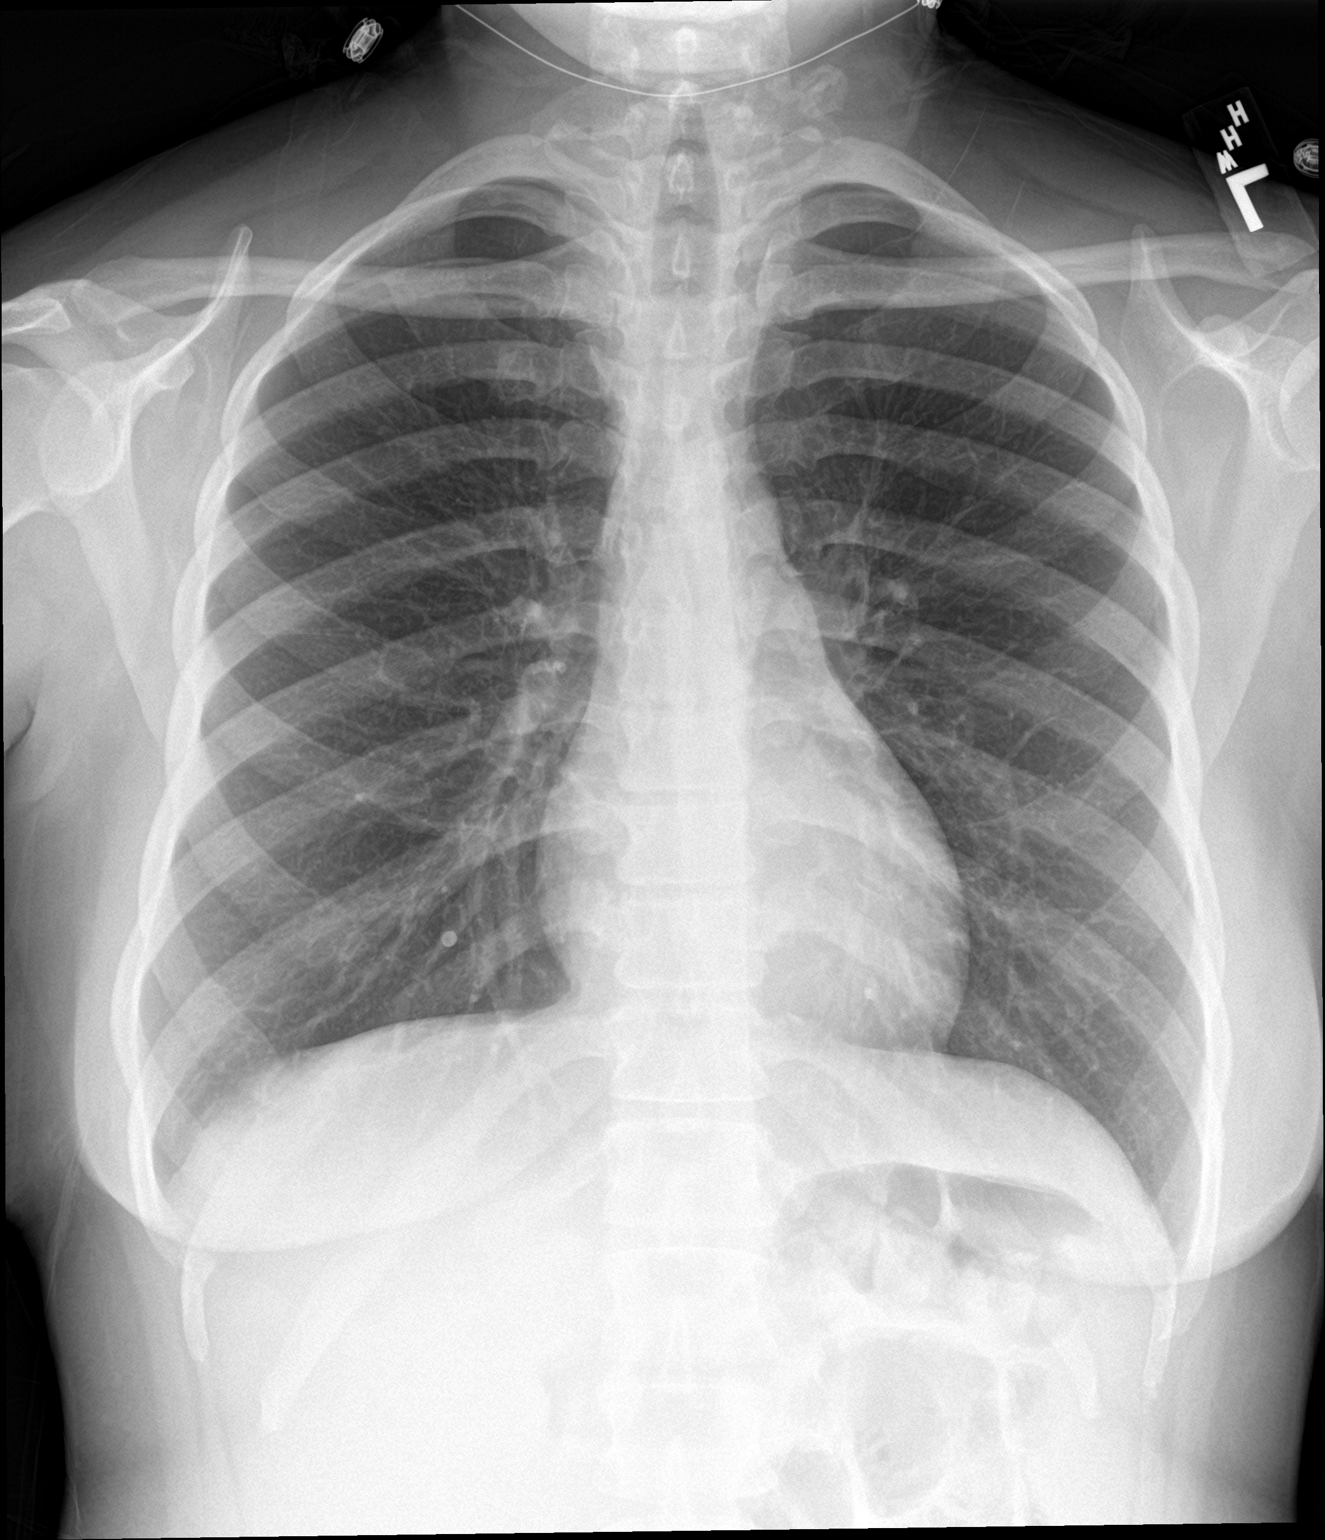

[chest lat]
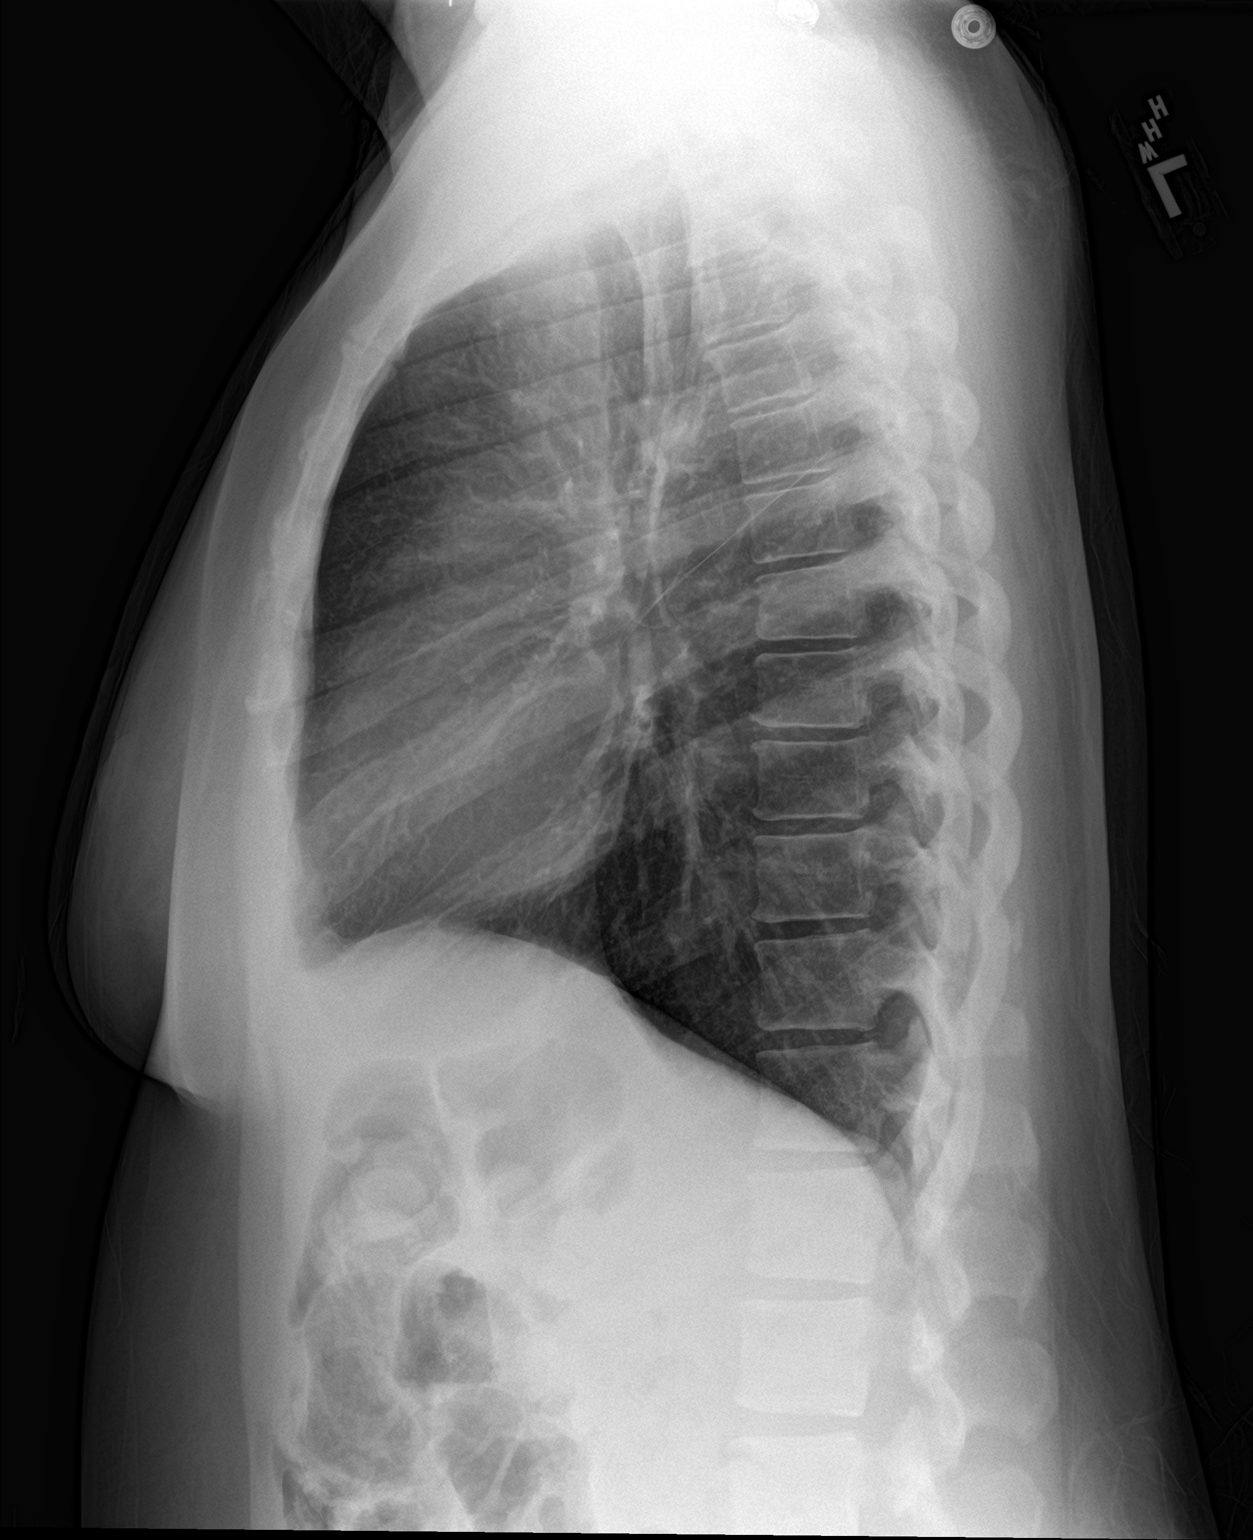

[2 of 2 positions shown; findings below may reference images not displayed]

FINDINGS: There is slight peribronchial thickening visible on the lateral
view. The lungs are otherwise clear. Heart size and vascularity are
normal. No osseous abnormality. No effusions.
IMPRESSION: Slight bronchitic changes.

## 2016-04-02 ENCOUNTER — Encounter: Payer: Self-pay | Admitting: Nurse Practitioner

## 2016-04-02 ENCOUNTER — Ambulatory Visit (INDEPENDENT_AMBULATORY_CARE_PROVIDER_SITE_OTHER): Payer: BLUE CROSS/BLUE SHIELD | Admitting: Nurse Practitioner

## 2016-04-02 VITALS — BP 120/76 | Temp 98.1°F | Ht 60.0 in | Wt 181.0 lb

## 2016-04-02 DIAGNOSIS — J0120 Acute ethmoidal sinusitis, unspecified: Secondary | ICD-10-CM

## 2016-04-02 MED ORDER — METHYLPREDNISOLONE ACETATE 40 MG/ML IJ SUSP
40.0000 mg | Freq: Once | INTRAMUSCULAR | Status: AC
Start: 2016-04-02 — End: 2016-04-02
  Administered 2016-04-02: 40 mg via INTRAMUSCULAR

## 2016-04-02 MED ORDER — AMOXICILLIN-POT CLAVULANATE 875-125 MG PO TABS: 1.0000 | ORAL_TABLET | Freq: Two times a day (BID) | ORAL | 0 refills | Status: DC

## 2016-04-03 ENCOUNTER — Encounter: Payer: Self-pay | Admitting: Nurse Practitioner

## 2016-04-03 NOTE — Progress Notes (Signed)
Subjective:  Presents for complaints of sinus congestion for the past 3 days. Has a history of chronic rhinitis. Low-grade fever. Sore throat. Ethmoid sinus area headache. Runny nose. Off-and-on cough producing yellow mucus. Bilateral ear pain. No wheezing.  Objective:   BP 120/76   Temp 98.1 F (36.7 C) (Oral)   Ht 5' (1.524 m)   Wt 181 lb (82.1 kg)   BMI 35.35 kg/m  NAD. Alert, oriented. TMs significant clear effusion, no erythema. Pharynx erythematous with green PND noted. Neck supple with mild soft anterior adenopathy. Lungs clear. Heart regular rate rhythm.  Assessment: Acute non-recurrent ethmoidal sinusitis - Plan: methylPREDNISolone acetate (DEPO-MEDROL) injection 40 mg  Plan:  Meds ordered this encounter  Medications  . amoxicillin-clavulanate (AUGMENTIN) 875-125 MG tablet    Sig: Take 1 tablet by mouth 2 (two) times daily.    Dispense:  20 tablet    Refill:  0    Order Specific Question:   Supervising Provider    Answer:   Mikey Kirschner [[id number]]  . methylPREDNISolone acetate (DEPO-MEDROL) injection 40 mg   OTC meds as directed for congestion and cough. Warning signs reviewed. Call back next week if no improvement, sooner if worse.

## 2016-10-19 ENCOUNTER — Emergency Department (HOSPITAL_COMMUNITY)
Admission: EM | Admit: 2016-10-19 | Discharge: 2016-10-19 | Disposition: A | Discharge status: Home / Self Care | Payer: Self-pay | Attending: Emergency Medicine | Admitting: Emergency Medicine

## 2016-10-19 ENCOUNTER — Emergency Department (HOSPITAL_COMMUNITY): Payer: Self-pay

## 2016-10-19 ENCOUNTER — Encounter (HOSPITAL_COMMUNITY): Payer: Self-pay | Admitting: Cardiology

## 2016-10-19 DIAGNOSIS — S9031XA Contusion of right foot, initial encounter: Secondary | ICD-10-CM | POA: Insufficient documentation

## 2016-10-19 DIAGNOSIS — Y939 Activity, unspecified: Secondary | ICD-10-CM | POA: Insufficient documentation

## 2016-10-19 DIAGNOSIS — J45909 Unspecified asthma, uncomplicated: Secondary | ICD-10-CM | POA: Insufficient documentation

## 2016-10-19 DIAGNOSIS — W51XXXA Accidental striking against or bumped into by another person, initial encounter: Secondary | ICD-10-CM | POA: Insufficient documentation

## 2016-10-19 DIAGNOSIS — Y999 Unspecified external cause status: Secondary | ICD-10-CM | POA: Insufficient documentation

## 2016-10-19 DIAGNOSIS — Y929 Unspecified place or not applicable: Secondary | ICD-10-CM | POA: Insufficient documentation

## 2016-10-19 DIAGNOSIS — I10 Essential (primary) hypertension: Secondary | ICD-10-CM | POA: Insufficient documentation

## 2016-10-19 MED ORDER — TRAMADOL HCL 50 MG PO TABS: 50.0000 mg | ORAL_TABLET | Freq: Four times a day (QID) | ORAL | 0 refills | Status: DC | PRN

## 2016-10-19 MED ORDER — IBUPROFEN 600 MG PO TABS: 600.0000 mg | ORAL_TABLET | Freq: Four times a day (QID) | ORAL | 0 refills | Status: DC | PRN

## 2016-10-19 MED ORDER — IBUPROFEN 800 MG PO TABS
800.0000 mg | ORAL_TABLET | Freq: Once | ORAL | Status: AC
  Administered 2016-10-19: 800 mg via ORAL
  Filled 2016-10-19: qty 1

## 2016-10-19 NOTE — ED Provider Notes (Signed)
[facility]-EM[facility]Y DEPT Provider Note   By signing my name below, I, Bea Graff, attest that this documentation has been prepared under the direction and in the presence of Evalee Jefferson, PA-C. Electronically Signed: Bea Graff, ED Scribe. 10/19/16. 7:17 PM.    History   Chief Complaint Chief Complaint  Patient presents with  . Foot Injury   The history is provided by the patient and medical records. No language interpreter was used.    Jasmin Chen is an obese 36 y.o. female with PMHx of asthma, chronic knee pain, HTN, HLD who presents to the Emergency Department complaining of right foot pain that began about two hours ago. She reports associated swelling to the dorsum of the foot. She states her 50-60 pound nephew fell backwards on the foot. She has not taken anything for pain. Bearing weight increases the pain. She denies alleviating factors. She denies numbness, tingling or weakness of the right foot, ankle or leg, bruising, wounds. Her PCP is Dr. Wolfgang Phoenix. She reports an allergy to Prednisone stating it makes her crazy. She denies possibility of pregnancy stating she has had a hysterectomy.   Past Medical History:  Diagnosis Date  . Asthma   . Chronic knee pain   . Chronic rhinitis   . Hidradenitis suppurativa   . Hypertension   . IBS (irritable bowel syndrome)   . Migraine without aura 2009  . Multiple environmental allergies   . Other and unspecified ovarian cyst 02/06/2013  . Ovarian cyst   . Right leg weakness    chronic    Patient Active Problem List   Diagnosis Date Noted  . 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 Left ovarian cyst 03/11/2014  . Ovarian cyst 03/11/2014  . Knee pain 10/10/2013  . Stiffness of joint, not elsewhere classified, lower leg 10/10/2013  . Weakness of right leg 10/10/2013  . Other and unspecified ovarian cyst 03/20/2013  . Other and unspecified ovarian cyst 02/06/2013  . Other and unspecified ovarian cyst 02/06/2013  . IBS (irritable  bowel syndrome) 02/03/2013  . Acid reflux 09/28/2012  . HLD (hyperlipidemia) 09/28/2012  . Benign hypertension 09/28/2012  . Hidradenitis suppurativa 09/19/2012  . Morbid obesity ([place]) 09/19/2012  . RECTAL BLEEDING 08/20/2010  . ABDOMINAL PAIN-LLQ 08/20/2010    Past Surgical History:  Procedure Laterality Date  . 2 cysts removed from arm    . ABDOMINAL HYSTERECTOMY    . BREAST REDUCTION SURGERY    . deviated septum repair    . hemroid surgery    . LAPAROSCOPIC GASTRIC SLEEVE RESECTION      OB History    No data available       Home Medications    Prior to Admission medications   Medication Sig Start Date End Date Taking? Authorizing Provider  albuterol (PROVENTIL HFA;VENTOLIN HFA) 108 (90 Base) MCG/ACT inhaler Inhale 2 puffs into the lungs every 4 (four) hours as needed for wheezing or shortness of breath. 09/20/15   Mikey Kirschner, MD  albuterol (PROVENTIL) (2.5 MG/3ML) 0.083% nebulizer solution Take 3 mLs (2.5 mg total) by nebulization every 4 (four) hours as needed for wheezing or shortness of breath. 09/20/15   Mikey Kirschner, MD  ALPRAZolam Duanne Moron) 0.25 MG tablet Take 0.125-0.25 mg by mouth at bedtime as needed for anxiety.    [provider]  amoxicillin-clavulanate (AUGMENTIN) 875-125 MG tablet Take 1 tablet by mouth 2 (two) times daily. 04/02/16   Nilda Simmer, NP  docusate sodium (COLACE) 100 MG capsule Take 1 capsule (  100 mg total) by mouth 2 (two) times daily as needed. Patient taking differently: Take 100 mg by mouth 2 (two) times daily.  03/13/14   Jonnie Kind, MD  fluticasone (FLONASE) 50 MCG/ACT nasal spray Place 2 sprays into both nostrils daily as needed for allergies.  06/21/13   [provider]  hydrOXYzine (ATARAX/VISTARIL) 25 MG tablet Take 1 tablet (25 mg total) by mouth every 6 (six) hours. 02/17/16   Ashley Murrain, NP  ibuprofen (ADVIL,MOTRIN) 600 MG tablet Take 1 tablet (600 mg total) by mouth every 6 (six) hours as needed.  10/19/16   Evalee Jefferson, PA-C  Multiple Vitamin (MULTIVITAMIN WITH MINERALS) TABS tablet Take 1 tablet by mouth daily.    [provider]  omeprazole (PRILOSEC) 20 MG capsule Take 20 mg by mouth daily as needed (Acid Reflux).    [provider]  topiramate (TOPAMAX) 50 MG tablet Take 50 mg by mouth 2 (two) times daily as needed (Headaches).     [provider]  traMADol (ULTRAM) 50 MG tablet Take 1 tablet (50 mg total) by mouth every 6 (six) hours as needed. 10/19/16   Evalee Jefferson, PA-C  triamcinolone ointment (KENALOG) 0.1 % Apply 1 application topically 2 (two) times daily. Prn rash up to 2 weeks 03/26/15   Nilda Simmer, NP  ZYRTEC ALLERGY 10 MG tablet TAKE 1 TABLET BY MOUTH EVERY DAY 10/21/15   Kathyrn Drown, MD    Family History Family History  Problem Relation Age of Onset  . Hypertension Mother   . Diabetes Mother   . Hyperlipidemia Mother   . Hypertension Father   . Hyperlipidemia Father   . Heart attack Father   . Sarcoidosis Sister   . Thyroid disease Sister   . Hyperlipidemia Sister   . Hypertension Sister     Social History Social History  Substance Use Topics  . Smoking status: Never Smoker  . Smokeless tobacco: Never Used  . Alcohol use No     Allergies   Prednisone   Review of Systems Review of Systems  Constitutional: Negative for fever.  Musculoskeletal: Positive for arthralgias. Negative for myalgias.  Neurological: Negative for weakness and numbness.     Physical Exam Updated Vital Signs BP 122/79 (BP Location: Right Arm)   Pulse 79   Temp 98 F (36.7 C) (Oral)   Resp 18   Ht 5' (1.524 m)   Wt 180 lb (81.6 kg)   SpO2 99%   BMI 35.15 kg/m   Physical Exam  Constitutional: She appears well-developed and well-nourished.  HENT:  Head: Atraumatic.  Neck: Normal range of motion.  Cardiovascular:  DP pulses intact.  Musculoskeletal: She exhibits tenderness.  Tenderness to palpation at right dorsal foot. Very mild  edema without bruising. Skin intact. Able to flex ankle and toes without difficulty. Achilles tendon nontender and without deformity.  Neurological: She is alert. She has normal strength. She displays normal reflexes. No sensory deficit.  Distal sensations normal.  Skin: Skin is warm and dry.  Psychiatric: She has a normal mood and affect.     ED Treatments / Results  DIAGNOSTIC STUDIES: Oxygen Saturation is 99% on RA, normal by my interpretation.   COORDINATION OF CARE: 7:10 PM- Encouraged pt to ice and elevate the foot. Advised after two days she can start alternating ice and heat therapy. Will apply ace wrap and give post op shoe. Will order pain medication prior to discharge. Pt verbalizes understanding and agrees to plan.  Medications  ibuprofen (ADVIL,MOTRIN) tablet 800 mg (800 mg Oral Given 10/19/16 1927)    Labs (all labs ordered are listed, but only abnormal results are displayed) Labs Reviewed - No data to display  EKG  EKG Interpretation None       Radiology Dg Foot Complete Right  Result Date: 10/19/2016 CLINICAL DATA:  Status post fall today with pain across the top of right foot. EXAM: RIGHT FOOT COMPLETE - 3+ VIEW COMPARISON:  None. FINDINGS: There is no evidence of fracture or dislocation. There is plantar calcaneal spur. Calcification at the Achilles tendon insertion to the calcaneus is noted. Soft tissues are unremarkable. IMPRESSION: No acute fracture or dislocation. Electronically Signed   By: Abelardo Diesel M.D.   On: 10/19/2016 18:40    Procedures Procedures (including critical care time)  Medications Ordered in ED Medications  ibuprofen (ADVIL,MOTRIN) tablet 800 mg (800 mg Oral Given 10/19/16 1927)     Initial Impression / Assessment and Plan / ED Course  I have reviewed the triage vital signs and the nursing notes.  Pertinent labs & imaging results that were available during my care of the patient were reviewed by me and considered in my medical  decision making (see chart for details).     Patient X-Ray negative for obvious fracture or dislocation. Pt advised to follow up with pcp in one week if sx persist or are not improving. Patient given ace wrap and post op shoe while in ED, conservative therapy recommended and discussed. RICE. Patient will be discharged home & is agreeable with above plan. Returns precautions discussed. Pt appears safe for discharge.   Final Clinical Impressions(s) / ED Diagnoses   Final diagnoses:  Contusion of right foot, initial encounter    New Prescriptions Discharge Medication List as of 10/19/2016  7:24 PM    START taking these medications   Details  ibuprofen (ADVIL,MOTRIN) 600 MG tablet Take 1 tablet (600 mg total) by mouth every 6 (six) hours as needed., Starting Mon 10/19/2016, Print    traMADol (ULTRAM) 50 MG tablet Take 1 tablet (50 mg total) by mouth every 6 (six) hours as needed., Starting Mon 10/19/2016, Print        I personally performed the services described in this documentation, which was scribed in my presence. The recorded information has been reviewed and is accurate.     Evalee Jefferson, PA-C 10/19/16 1947    Jasmin Sorrow, MD 10/27/16 [phone]

## 2016-10-19 NOTE — ED Triage Notes (Signed)
Nephew fell on right foot today.  c/o pain

## 2016-10-19 NOTE — Discharge Instructions (Signed)
Wrap your foot in the ace wrap for support and wear the post op shoe to protect your foot while this injury heals.  Elevation and ice (5 minutes every hour for the next 2 days while awake) will be helpful.  You may add a heating pad for 20 minutes 3 times daily starting on Thursday. Get rechecked by your doctor if your pain is not improving over the next week.  Your xrays are negative for any bony injury today.

## 2016-11-02 ENCOUNTER — Ambulatory Visit (INDEPENDENT_AMBULATORY_CARE_PROVIDER_SITE_OTHER): Payer: Self-pay | Admitting: Family Medicine

## 2016-11-02 ENCOUNTER — Encounter: Payer: Self-pay | Admitting: Family Medicine

## 2016-11-02 VITALS — BP 112/74 | Ht 60.0 in | Wt 202.0 lb

## 2016-11-02 DIAGNOSIS — M79671 Pain in right foot: Secondary | ICD-10-CM

## 2016-11-02 MED ORDER — DICLOFENAC SODIUM 75 MG PO TBEC: 75.0000 mg | DELAYED_RELEASE_TABLET | Freq: Two times a day (BID) | ORAL | 0 refills | Status: DC

## 2016-11-02 NOTE — Patient Instructions (Signed)
Omeprazole 20 mg one daily -use otc from [employer]

## 2016-11-02 NOTE — Progress Notes (Signed)
   Subjective:    Patient ID: Jasmin Chen, female    DOB: 1980/08/21, 36 y.o.   MRN: [id number]  Foot Pain  This is a recurrent problem. Episode onset: March. Associated symptoms comments: Right foot pain. She has tried ice, heat and rest (ace wrap) for the symptoms. The treatment provided no relief.   This patient relates that her nephew fell backward on her foot back in March 2018 cause pain and discomfort ever since then midfoot pain hurts with walking denies any other injury went to the ER they did x-ray. Negative for fracture. Patient using ibuprofen   Review of Systems Relates blood pain denies ankle pain denies knee pain hip pain    Objective:   Physical Exam Ankle normal knee normal calf normal midfoot moderate tenderness no significant swelling toes normal   Possibility of a ligament issue as well    Assessment & Plan:  Midfoot pain probable bone contusion try diclofenac 75 mg twice a day If not doing better over the next 2-3 weeks consider podiatry referral Patient does not have insurance and therefore MRI would be cost prohibitive

## 2017-04-19 DIAGNOSIS — R6883 Chills (without fever): Secondary | ICD-10-CM | POA: Diagnosis not present

## 2017-04-19 DIAGNOSIS — R61 Generalized hyperhidrosis: Secondary | ICD-10-CM | POA: Diagnosis not present

## 2017-04-19 DIAGNOSIS — R002 Palpitations: Secondary | ICD-10-CM | POA: Diagnosis not present

## 2017-04-19 DIAGNOSIS — I6789 Other cerebrovascular disease: Secondary | ICD-10-CM | POA: Diagnosis not present

## 2017-04-19 DIAGNOSIS — Z8709 Personal history of other diseases of the respiratory system: Secondary | ICD-10-CM | POA: Diagnosis not present

## 2017-04-19 DIAGNOSIS — R202 Paresthesia of skin: Secondary | ICD-10-CM | POA: Diagnosis not present

## 2017-04-19 DIAGNOSIS — R51 Headache: Secondary | ICD-10-CM | POA: Diagnosis not present

## 2017-04-19 DIAGNOSIS — R0981 Nasal congestion: Secondary | ICD-10-CM | POA: Diagnosis not present

## 2017-04-19 DIAGNOSIS — R064 Hyperventilation: Secondary | ICD-10-CM | POA: Diagnosis not present

## 2017-04-20 DIAGNOSIS — R002 Palpitations: Secondary | ICD-10-CM | POA: Diagnosis not present

## 2017-04-22 ENCOUNTER — Encounter: Payer: Self-pay | Admitting: Family Medicine

## 2017-04-22 ENCOUNTER — Ambulatory Visit: Payer: BLUE CROSS/BLUE SHIELD | Admitting: Family Medicine

## 2017-04-22 VITALS — BP 118/72 | Ht 60.0 in | Wt 199.0 lb

## 2017-04-22 DIAGNOSIS — E785 Hyperlipidemia, unspecified: Secondary | ICD-10-CM

## 2017-04-22 DIAGNOSIS — Z23 Encounter for immunization: Secondary | ICD-10-CM | POA: Diagnosis not present

## 2017-04-22 DIAGNOSIS — R002 Palpitations: Secondary | ICD-10-CM | POA: Diagnosis not present

## 2017-04-22 NOTE — Progress Notes (Signed)
   Subjective:    Patient ID: Jasmin Chen, female    DOB: 05-03-1981, 36 y.o.   MRN: [id number]  HPI Patient arrives for for a follow up from a recent ER visit for palpitations and hyperventilation. Patient states they reccommended a referral to cardiology- see ER report and ekg ER records lab work EKG reviewed Patient relates that she had onset while she is at work a palpitations then she felt short of breath then she felt run down in ended up having to go via 911 to the hospital for further evaluation and treatment  Review of Systems Relates palpitations denies shortness of breath chest pressure sweats chills nausea vomiting diarrhea    Objective:   Physical Exam Lungs are clear hearts regular pulse normal blood pressure good patient moderately overweight       Assessment & Plan:  Palpitations-I don't find any evidence of any serious underlying heart disease. Everything I see from the ER visit she had does not reveal any serious underlying disease I do believe it does need further looking into referral to cardiology more than likely will need event monitor  More than likely patient had a hyperventilation as well concerning to her symptoms  Patient was encouraged healthy diet regular physical activity and losing weight screening lab work ordered patient does have family history of coronary artery disease  We will check thyroid function

## 2017-04-23 ENCOUNTER — Encounter: Payer: Self-pay | Admitting: Family Medicine

## 2017-04-23 LAB — LIPID PANEL
Chol/HDL Ratio: 3.1 ratio (ref 0.0–4.4)
Cholesterol, Total: 204 mg/dL — ABNORMAL HIGH (ref 100–199)
HDL: 66 mg/dL (ref >39)
LDL Calculated: 104 mg/dL — ABNORMAL HIGH (ref 0–99)
Triglycerides: 172 mg/dL — ABNORMAL HIGH (ref 0–149)
VLDL Cholesterol Cal: 34 mg/dL (ref 5–40)

## 2017-04-23 LAB — TSH: TSH: 3.8 u[IU]/mL (ref 0.450–4.500)

## 2017-04-23 LAB — T4, FREE: Free T4: 1.05 ng/dL (ref 0.82–1.77)

## 2017-04-26 ENCOUNTER — Ambulatory Visit: Payer: BLUE CROSS/BLUE SHIELD | Admitting: Cardiology

## 2017-04-26 VITALS — BP 118/76 | HR 89 | Ht 60.0 in | Wt 199.4 lb

## 2017-04-26 DIAGNOSIS — R002 Palpitations: Secondary | ICD-10-CM

## 2017-04-26 DIAGNOSIS — Z87898 Personal history of other specified conditions: Secondary | ICD-10-CM | POA: Diagnosis not present

## 2017-04-26 DIAGNOSIS — I10 Essential (primary) hypertension: Secondary | ICD-10-CM

## 2017-04-26 NOTE — Progress Notes (Signed)
PCP: Kathyrn Drown, MD ([place])  Clinic Note: Chief Complaint  Patient presents with  . New Patient (Initial Visit)    HPI: Jasmin Chen is a 36 y.o. female who is being seen today for the evaluation of intermittent palpitations at the request of Luking, Elayne Snare, MD.  Jasmin Chen was seen on November 8 by Dr. Wolfgang Phoenix after an emergency room visit on November 5.  His impression was that her palpitation episode was probably related to hyperventilation. She is status post gastric bypass surgery.  Recent Hospitalizations:   April 19, 2017: Palpitations that resolved prior to arrival.  She noted bilateral hand and foot tingling.  She came in by EMS  with a heart rate of 102 bpm.  She was hyperventilating at the time.  She noted some sinus pressure and congestion as well as chills and diaphoresis leading up to that episode for about a week.  Apparently she took a pneumatic medicine called Rexall.  01/27/2017: Fall @ work (fell backwards pulling a heavy door & the handle broke).  Studies Personally Reviewed - (if available, images/films reviewed: From Epic Chart or Care Everywhere)  None  Interval History: Jasmin Chen presents today basically to follow-up the episode of palpitations that she had.  She has had intermittent spells of palpitations have been occurring off and on.  But the most significant one was the episode back on 5 November.  She said that it was shortly after arriving at work.  She started feeling her heart rate going faster and despite her track to try to do simple things and may get better it it just continue to get faster.  She went to go to get some to eat and drink and that did not help.  A coworker noted that she looked pale and lightheaded.  Finally someone called EMS when she sort of fell because her leg gave out on her. She did not lose consciousness at this time and did not necessarily feel like presyncope.  She did have an episode of syncope  about 3 years ago, but has not had any further symptoms and that was probably related to dehydration and vasovagal combination based on description.  She has occasional short episodes of palpitations but nothing like the episode that she had last week.  She has not had any further syncopal episodes.  No TIA or arm restrictions.  She denies any chest tightness or pressure with rest or exertion.  No resting or exertional dyspnea.  No PND, orthopnea or edema..  No melena, hematochezia, hematuria, or epstaxis. No claudication.  ROS: A comprehensive was performed.  Not related to her episode.  .  Pertinent symptoms noted above. Review of Systems  Constitutional: Positive for weight loss (Intentional because of exercise, diet and having had gastric bypass surgery.). Negative for malaise/fatigue.  HENT: Negative for congestion and nosebleeds.   Respiratory: Positive for shortness of breath (With exertion). Negative for cough (Intermittently) and sputum production.   Cardiovascular: Positive for chest pain and palpitations.       Per HPI  Gastrointestinal: Negative for abdominal pain, blood in stool, constipation and diarrhea.  Genitourinary: Negative for hematuria.  Musculoskeletal: Negative for joint pain.  Neurological: Positive for dizziness (Occasionally with positioning.  Especially if she does not drink much.). Negative for weakness.  Psychiatric/Behavioral: Negative for depression and memory loss. The patient is nervous/anxious.   All other systems reviewed and are negative.  I have reviewed and (if needed) personally updated the  patient's problem list, medications, allergies, past medical and surgical history, social and family history.   Past Medical History:  Diagnosis Date  . Asthma   . Chronic knee pain   . Chronic rhinitis   . Hidradenitis suppurativa   . Hypertension   . IBS (irritable bowel syndrome)   . Migraine without aura 2009  . Multiple environmental allergies   .  Other and unspecified ovarian cyst 02/06/2013  . Ovarian cyst   . Right leg weakness    chronic    Past Surgical History:  Procedure Laterality Date  . 2 cysts removed from arm    . ABDOMINAL HYSTERECTOMY    . BREAST REDUCTION SURGERY    . deviated septum repair    . hemroid surgery    . LAPAROSCOPIC GASTRIC SLEEVE RESECTION      Current Meds  Medication Sig  . albuterol (PROVENTIL HFA;VENTOLIN HFA) 108 (90 Base) MCG/ACT inhaler Inhale 2 puffs into the lungs every 4 (four) hours as needed for wheezing or shortness of breath.  Marland Kitchen albuterol (PROVENTIL) (2.5 MG/3ML) 0.083% nebulizer solution Take 3 mLs (2.5 mg total) by nebulization every 4 (four) hours as needed for wheezing or shortness of breath.  . ALPRAZolam (XANAX) 0.25 MG tablet Take 0.125-0.25 mg by mouth at bedtime as needed for anxiety.  . fluticasone (FLONASE) 50 MCG/ACT nasal spray Place 2 sprays into both nostrils daily as needed for allergies.   . Multiple Vitamin (MULTIVITAMIN WITH MINERALS) TABS tablet Take 1 tablet by mouth daily.  Marland Kitchen omeprazole (PRILOSEC) 20 MG capsule Take 20 mg by mouth daily as needed (Acid Reflux).  Marland Kitchen ZYRTEC ALLERGY 10 MG tablet TAKE 1 TABLET BY MOUTH EVERY DAY    Allergies  Allergen Reactions  . Prednisone Other (See Comments)    Difficulty concentrating and functioning    Social History   Socioeconomic History  . Marital status: Single    Spouse name: Not on file  . Number of children: Not on file  . Years of education: Not on file  . Highest education level: Not on file  Social Needs  . Financial resource strain: Not on file  . Food insecurity - worry: Not on file  . Food insecurity - inability: Not on file  . Transportation needs - medical: Not on file  . Transportation needs - non-medical: Not on file  Occupational History  . Not on file  Tobacco Use  . Smoking status: Never Smoker  . Smokeless tobacco: Never Used  Substance and Sexual Activity  . Alcohol use: No  . Drug use:  No  . Sexual activity: Not Currently    Birth control/protection: Surgical  Other Topics Concern  . Not on file  Social History Narrative  . Not on file    family history includes Diabetes in her mother; Heart attack in her father; Hyperlipidemia in her father, mother, and sister; Hypertension in her father, mother, and sister; Sarcoidosis in her sister; Thyroid disease in her sister.  Wt Readings from Last 3 Encounters:  04/26/17 199 lb 6.4 oz (90.4 kg)  04/22/17 199 lb (90.3 kg)  11/02/16 202 lb (91.6 kg)    PHYSICAL EXAM BP 118/76   Pulse 89   Ht 5' (1.524 m)   Wt 199 lb 6.4 oz (90.4 kg)   BMI 38.94 kg/m   Physical Exam  Constitutional: She is oriented to person, place, and time. She appears well-developed and well-nourished. No distress.  Moderate-severely obese  HENT:  Head: Normocephalic and atraumatic.  Eyes: EOM are normal. Pupils are equal, round, and reactive to light. No scleral icterus.  Neck: Normal range of motion. Neck supple. No JVD present. Thyromegaly present.  Cardiovascular: Normal rate, regular rhythm and intact distal pulses. Exam reveals friction rub. Exam reveals no gallop.  No murmur heard. Pulmonary/Chest: Effort normal and breath sounds normal. No respiratory distress. She has no wheezes. She has no rales.  Abdominal: Soft. Bowel sounds are normal. She exhibits no distension. There is no tenderness. There is no rebound.  Musculoskeletal: Normal range of motion. She exhibits no edema.  Neurological: She is alert and oriented to person, place, and time. No cranial nerve deficit. Coordination normal.  Skin: Skin is warm and dry.  Psychiatric: She has a normal mood and affect. Her behavior is normal. Judgment and thought content normal.  Nursing note and vitals reviewed.    Adult ECG Report Not checked  Other studies Reviewed: Additional studies/ records that were reviewed today include:  Recent Labs:   Lab Results  Component Value Date    CREATININE 0.52 02/13/2015   BUN 10 02/13/2015   NA 138 02/13/2015   K 3.5 02/13/2015   CL 108 02/13/2015   CO2 23 02/13/2015    ASSESSMENT / PLAN: Problem List Items Addressed This Visit    Benign hypertension (Chronic)   History of syncope   Relevant Orders   ECHOCARDIOGRAM COMPLETE   Rapid palpitations - Primary    She has not had any further episodes since that one, and is never had another one before it.  I therefore am not sure if he would ever capture anything on monitor on this be sure how often it will occur. It sounds like the episode she had was related to possible SVT or PAT.  We discussed vagal maneuvers. If she does have recurrent symptoms, I would like for her to wear a monitor, or week we can discuss smart phone application for measuring heart rhythm. We will see him return after his study is complete.       Relevant Orders   ECHOCARDIOGRAM COMPLETE      Current medicines are reviewed at length with the patient today. (+/- concerns) n/a The following changes have been made: n/a  Patient Instructions  SCHEDULE AT [facility] has requested that you have an echocardiogram. Echocardiography is a painless test that uses sound waves to create images of your heart. It provides your doctor with information about the size and shape of your heart and how well your heart's chambers and valves are working. This procedure takes approximately one hour. There are no restrictions for this procedure.     RECOMMENDATION IF PALPATION RETURN  TRY TO FEEL YOUR PULSE FOR COUNT  Recommendations for vagal maneuvers:  "Bearing down"  Coughing  Gagging  Icy, cold towel on face or drink ice cold water CONTACT OFFICE -  POSSIBLE ORDER FOR  AN EVENT MONITOR   Your physician recommends that you schedule a follow-up appointment in 2 MONTHS  WITH DR HARDING.   If you need a refill on your cardiac medications before your next appointment,  please call your pharmacy.     Studies Ordered:   Orders Placed This Encounter  Procedures  . ECHOCARDIOGRAM COMPLETE      Glenetta Hew, M.D., M.S. Interventional Cardiologist   Pager # [phone] Phone # [phone] [address]. [place], [place] [address]

## 2017-04-26 NOTE — Patient Instructions (Addendum)
SCHEDULE AT [facility] has requested that you have an echocardiogram. Echocardiography is a painless test that uses sound waves to create images of your heart. It provides your doctor with information about the size and shape of your heart and how well your heart's chambers and valves are working. This procedure takes approximately one hour. There are no restrictions for this procedure.     RECOMMENDATION IF PALPATION RETURN  TRY TO FEEL YOUR PULSE FOR COUNT  Recommendations for vagal maneuvers:  "Bearing down"  Coughing  Gagging  Icy, cold towel on face or drink ice cold water CONTACT OFFICE -  POSSIBLE ORDER FOR  AN EVENT MONITOR   Your physician recommends that you schedule a follow-up appointment in 2 MONTHS  WITH DR HARDING.   If you need a refill on your cardiac medications before your next appointment, please call your pharmacy.

## 2017-04-28 ENCOUNTER — Encounter: Payer: Self-pay | Admitting: Cardiology

## 2017-04-28 NOTE — Assessment & Plan Note (Signed)
The episode of syncope she had was several years ago and she is never had another episode.  It could very well have been similar in etiology, but it sounds more based on her description is like a vasovagal episode with dehydration. Because she now has had some palpitations I have discussed the importance of staying adequately hydrated and avoiding excess caffeine and sugary foods. We discussed adequate exercise as well.

## 2017-04-28 NOTE — Assessment & Plan Note (Signed)
She has not had any further episodes since that one, and is never had another one before it.  I therefore am not sure if he would ever capture anything on monitor on this be sure how often it will occur. It sounds like the episode she had was related to possible SVT or PAT.  We discussed vagal maneuvers. If she does have recurrent symptoms, I would like for her to wear a monitor, or week we can discuss smart phone application for measuring heart rhythm. We will see him return after his study is complete.

## 2017-04-28 NOTE — Assessment & Plan Note (Signed)
Blood pressure looks well-controlled today.  She is on no medications.  We probably could use a beta-blocker necessary or calcium channel blocker, but would prefer to avoid medical management.

## 2017-04-28 NOTE — Assessment & Plan Note (Signed)
She has lost quite a bit of weight and is status post bariatric surgery.  She still needs stay active extremities work on continuing to lose weight and monitor her diet.

## 2017-05-03 ENCOUNTER — Other Ambulatory Visit: Payer: Self-pay

## 2017-05-03 ENCOUNTER — Ambulatory Visit (HOSPITAL_COMMUNITY): Payer: BLUE CROSS/BLUE SHIELD | Attending: Cardiology

## 2017-05-03 DIAGNOSIS — Z87898 Personal history of other specified conditions: Secondary | ICD-10-CM

## 2017-05-03 DIAGNOSIS — E785 Hyperlipidemia, unspecified: Secondary | ICD-10-CM | POA: Diagnosis not present

## 2017-05-03 DIAGNOSIS — I10 Essential (primary) hypertension: Secondary | ICD-10-CM | POA: Insufficient documentation

## 2017-05-03 DIAGNOSIS — R55 Syncope and collapse: Secondary | ICD-10-CM | POA: Insufficient documentation

## 2017-05-03 DIAGNOSIS — R002 Palpitations: Secondary | ICD-10-CM | POA: Diagnosis not present

## 2017-05-03 DIAGNOSIS — I340 Nonrheumatic mitral (valve) insufficiency: Secondary | ICD-10-CM | POA: Diagnosis not present

## 2017-05-05 NOTE — Progress Notes (Signed)
Echo results: Good news: Essentially normal echocardiogram and normal pump function and normal valve function.  No signs to suggest heart attack.. EF: 55-60%. No regional wall motion abnormalities   pls fwd to PCP: Kathyrn Drown, MD

## 2017-06-10 ENCOUNTER — Encounter: Payer: Self-pay | Admitting: *Deleted

## 2017-06-14 ENCOUNTER — Ambulatory Visit: Payer: BLUE CROSS/BLUE SHIELD | Admitting: Obstetrics and Gynecology

## 2017-06-18 ENCOUNTER — Ambulatory Visit: Payer: BLUE CROSS/BLUE SHIELD | Admitting: Adult Health

## 2017-06-18 ENCOUNTER — Encounter: Payer: Self-pay | Admitting: Adult Health

## 2017-06-18 VITALS — BP 120/80 | HR 76 | Ht 60.0 in | Wt 208.5 lb

## 2017-06-18 DIAGNOSIS — R102 Pelvic and perineal pain: Secondary | ICD-10-CM | POA: Diagnosis not present

## 2017-06-18 MED ORDER — IBUPROFEN 800 MG PO TABS: 800.0000 mg | ORAL_TABLET | Freq: Three times a day (TID) | ORAL | 1 refills | Status: DC | PRN

## 2017-06-18 NOTE — Patient Instructions (Signed)
Pelvic Pain, Female Pelvic pain is pain in your lower abdomen, below your belly button and between your hips. The pain may start suddenly (acute), keep coming back (recurring), or last a long time (chronic). Pelvic pain that lasts longer than six months is considered chronic. Pelvic pain may affect your:  Reproductive organs.  Urinary system.  Digestive tract.  Musculoskeletal system.  There are many potential causes of pelvic pain. Sometimes, the pain can be a result of digestive or urinary conditions, strained muscles or ligaments, or even reproductive conditions. Sometimes the cause of pelvic pain is not known. Follow these instructions at home:  Take over-the-counter and prescription medicines only as told by your health care provider.  Rest as told by your health care provider.  Do not have sex it if hurts.  Keep a journal of your pelvic pain. Write down: ? When the pain started. ? Where the pain is located. ? What seems to make the pain better or worse, such as food or your menstrual cycle. ? Any symptoms you have along with the pain.  Keep all follow-up visits as told by your health care provider. This is important. Contact a health care provider if:  Medicine does not help your pain.  Your pain comes back.  You have new symptoms.  You have abnormal vaginal discharge or bleeding, including bleeding after menopause.  You have a fever or chills.  You are constipated.  You have blood in your urine or stool.  You have foul-smelling urine.  You feel weak or lightheaded. Get help right away if:  You have sudden severe pain.  Your pain gets steadily worse.  You have severe pain along with fever, nausea, vomiting, or excessive sweating.  You lose consciousness. This information is not intended to replace advice given to you by your health care provider. Make sure you discuss any questions you have with your health care provider. Document Released: 04/28/2004  Document Revised: 06/26/2015 Document Reviewed: 03/22/2015 Elsevier Interactive Patient Education  2018 [employer] Inc[employer]  

## 2017-06-18 NOTE — Progress Notes (Signed)
Subjective:     Patient ID: Jasmin Chen, female   DOB: 17-Feb-1981, 37 y.o.   MRN: [id number]  HPI Jasmin Chen is a 37 year old black female in complaining of bilateral pelvic pain, "feels like ovaries swollen".She is sp [facility]. PCP is Dr Lance Sell office.   Review of Systems +bilateral pelvic pain,"feels like ovaries swollen" Denies problems with urination or bowels Reviewed past medical,surgical, social and family history. Reviewed medications and allergies.     Objective:   Physical Exam BP 120/80 (BP Location: Right Arm, Patient Position: Sitting, Cuff Size: Large)   Pulse 76   Ht 5' (1.524 m)   Wt 208 lb 8 oz (94.6 kg)   BMI 40.72 kg/m    Skin warm and dry.Pelvic: external genitalia is normal in appearance no lesions, vagina: white discharge without odor,urethra has no lesions or masses noted, cervix:smooth and bulbous, uterus: absent, adnexa: no masses, + tenderness noted,L>R. Bladder is non tender and no masses felt.  Will get [place] to assess.  Assessment:     1. Pelvic pain       Plan:     Meds ordered this encounter  Medications  . ibuprofen (ADVIL,MOTRIN) 800 MG tablet    Sig: Take 1 tablet (800 mg total) by mouth every 8 (eight) hours as needed.    Dispense:  60 tablet    Refill:  1    Order Specific Question:   Supervising Provider    Answer:   Tania Ade H [[id number]]  Return in 1 week for GYN [place] then see me 2-3 days later for pap and physical  Review handout on pelvic pain

## 2017-06-21 ENCOUNTER — Encounter: Payer: Self-pay | Admitting: *Deleted

## 2017-06-21 ENCOUNTER — Ambulatory Visit: Payer: BLUE CROSS/BLUE SHIELD | Admitting: Cardiology

## 2017-06-25 ENCOUNTER — Telehealth: Payer: Self-pay | Admitting: Family Medicine

## 2017-06-25 ENCOUNTER — Other Ambulatory Visit: Payer: Self-pay

## 2017-06-25 DIAGNOSIS — J00 Acute nasopharyngitis [common cold]: Secondary | ICD-10-CM | POA: Diagnosis not present

## 2017-06-25 DIAGNOSIS — R05 Cough: Secondary | ICD-10-CM | POA: Diagnosis not present

## 2017-06-25 DIAGNOSIS — J069 Acute upper respiratory infection, unspecified: Secondary | ICD-10-CM | POA: Diagnosis not present

## 2017-06-25 MED ORDER — BENZONATATE 100 MG PO CAPS: 100.0000 mg | ORAL_CAPSULE | Freq: Three times a day (TID) | ORAL | 0 refills | Status: DC | PRN

## 2017-06-25 NOTE — Telephone Encounter (Signed)
Unfortunately there is really not anything prescription wise other than Tessalon drops that can be called in if she would like that for the cough may use Tessalon 100 mg 1 3 times daily as needed for 7 days if her upper respiratory illness is going beyond 7 days I recommend office visit

## 2017-06-25 NOTE — Telephone Encounter (Signed)
Pt is wanting to know if something can be called in for a cough and a sore throat.     [facility] [address]

## 2017-06-25 NOTE — Telephone Encounter (Signed)
Prescription sent electronically to pharmacy. Patient notified and verbalized understanding and will be evaluated by doctor if ongoing or worse.

## 2017-06-25 NOTE — Telephone Encounter (Signed)
Patient with c/o scratchy throat and cough and would like something called in- not seen in office for illness in over 6 months-please advise

## 2017-06-26 ENCOUNTER — Emergency Department (HOSPITAL_COMMUNITY)
Admission: EM | Admit: 2017-06-26 | Discharge: 2017-06-26 | Disposition: A | Discharge status: Home / Self Care | Payer: BLUE CROSS/BLUE SHIELD | Attending: Emergency Medicine | Admitting: Emergency Medicine

## 2017-06-26 DIAGNOSIS — Z79899 Other long term (current) drug therapy: Secondary | ICD-10-CM | POA: Diagnosis not present

## 2017-06-26 DIAGNOSIS — J45909 Unspecified asthma, uncomplicated: Secondary | ICD-10-CM | POA: Diagnosis not present

## 2017-06-26 DIAGNOSIS — J069 Acute upper respiratory infection, unspecified: Secondary | ICD-10-CM | POA: Diagnosis not present

## 2017-06-26 DIAGNOSIS — J00 Acute nasopharyngitis [common cold]: Secondary | ICD-10-CM

## 2017-06-26 DIAGNOSIS — R05 Cough: Secondary | ICD-10-CM | POA: Diagnosis not present

## 2017-06-26 DIAGNOSIS — I10 Essential (primary) hypertension: Secondary | ICD-10-CM | POA: Insufficient documentation

## 2017-06-26 DIAGNOSIS — B9789 Other viral agents as the cause of diseases classified elsewhere: Secondary | ICD-10-CM

## 2017-06-26 MED ORDER — NAPROXEN 375 MG PO TABS: 375.0000 mg | ORAL_TABLET | Freq: Two times a day (BID) | ORAL | 0 refills | Status: DC

## 2017-06-26 MED ORDER — LORATADINE 10 MG PO TABS: 10.0000 mg | ORAL_TABLET | Freq: Every day | ORAL | 0 refills | Status: AC

## 2017-06-26 MED ORDER — ALBUTEROL SULFATE HFA 108 (90 BASE) MCG/ACT IN AERS
2.0000 | INHALATION_SPRAY | Freq: Once | RESPIRATORY_TRACT | Status: AC
  Administered 2017-06-26: 2 via RESPIRATORY_TRACT
  Filled 2017-06-26: qty 6.7

## 2017-06-26 MED ORDER — BENZONATATE 100 MG PO CAPS: 100.0000 mg | ORAL_CAPSULE | Freq: Three times a day (TID) | ORAL | 0 refills | Status: DC | PRN

## 2017-06-26 MED ORDER — FLUTICASONE PROPIONATE 50 MCG/ACT NA SUSP
2.0000 | Freq: Every day | NASAL | 0 refills | Status: AC
Start: 2017-06-26 — End: ?

## 2017-06-26 MED ORDER — AEROCHAMBER PLUS FLO-VU LARGE MISC
1.0000 | Freq: Once | Status: AC
  Administered 2017-06-26: 1

## 2017-06-26 MED ORDER — ACETAMINOPHEN 325 MG PO TABS
650.0000 mg | ORAL_TABLET | Freq: Once | ORAL | Status: AC
  Administered 2017-06-26: 650 mg via ORAL
  Filled 2017-06-26: qty 2

## 2017-06-26 NOTE — ED Provider Notes (Signed)
[facility] EMERGENCY DEPARTMENT Provider Note   CSN: [id number] Arrival date & time: 06/26/17  0225     History   Chief Complaint Chief Complaint  Patient presents with  . Nasal Congestion    HPI Jasmin Chen is a 37 y.o. female.  Jasmin Chen is a 37 y.o. Female who presents to the emergency department complaining of 2 days of sneezing, nasal congestion, postnasal drip, ear pressure, sore throat, and coughing.  Patient reports her symptoms began around 2 days ago.  She has had some chills but no fever.  No antipyretics prior to arrival.  She took a cough suppressant earlier this morning with little relief.  She reports she takes Zyrtec daily for years.  This is not helped much today.  She reports using Flonase intermittently, but none today.  She did receive her flu shot this year.  She reports some slight wheezing intermittently and reports a history of asthma.  She denies feeling short of breath.  She does have trouble breathing through her nose.  No trouble swallowing.  No neck stiffness.  She denies fevers, shortness of breath, abdominal pain, vomiting, diarrhea, neck stiffness or rashes.   The history is provided by the patient and medical records. No language interpreter was used.    Past Medical History:  Diagnosis Date  . Asthma   . Chronic knee pain   . Chronic rhinitis   . Hidradenitis suppurativa   . Hypertension   . IBS (irritable bowel syndrome)   . Migraine without aura 2009  . Multiple environmental allergies   . Other and unspecified ovarian cyst 02/06/2013  . Ovarian cyst   . Right leg weakness    chronic    Patient Active Problem List   Diagnosis Date Noted  . Rapid palpitations 04/26/2017  . History of syncope 04/26/2017  . 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 Left ovarian cyst 03/11/2014  . Ovarian cyst 03/11/2014  . Knee pain 10/10/2013  . Stiffness of joint, not elsewhere classified, lower leg 10/10/2013  . Weakness of right  leg 10/10/2013  . Other and unspecified ovarian cyst 03/20/2013  . Other and unspecified ovarian cyst 02/06/2013  . Other and unspecified ovarian cyst 02/06/2013  . IBS (irritable bowel syndrome) 02/03/2013  . Acid reflux 09/28/2012  . HLD (hyperlipidemia) 09/28/2012  . Benign hypertension 09/28/2012  . Hidradenitis suppurativa 09/19/2012  . Morbid obesity ([place]) 09/19/2012  . RECTAL BLEEDING 08/20/2010  . ABDOMINAL PAIN-LLQ 08/20/2010    Past Surgical History:  Procedure Laterality Date  . 2 cysts removed from arm    . ABDOMINAL HYSTERECTOMY    . BREAST REDUCTION SURGERY    . deviated septum repair    . hemroid surgery    . LAPAROSCOPIC GASTRIC SLEEVE RESECTION      OB History    Gravida Para Term Preterm AB Living   0 0 0 0 0 0   SAB TAB Ectopic Multiple Live Births   0 0 0 0 0       Home Medications    Prior to Admission medications   Medication Sig Start Date End Date Taking? Authorizing Provider  albuterol (PROVENTIL HFA;VENTOLIN HFA) 108 (90 Base) MCG/ACT inhaler Inhale 2 puffs into the lungs every 4 (four) hours as needed for wheezing or shortness of breath. 09/20/15   Mikey Kirschner, MD  albuterol (PROVENTIL) (2.5 MG/3ML) 0.083% nebulizer solution Take 3 mLs (2.5 mg total) by nebulization every 4 (four) hours as needed for wheezing  or shortness of breath. 09/20/15   Mikey Kirschner, MD  ALPRAZolam Duanne Moron) 0.25 MG tablet Take 0.125-0.25 mg by mouth at bedtime as needed for anxiety.    [provider]  benzonatate (TESSALON) 100 MG capsule Take 1 capsule (100 mg total) by mouth 3 (three) times daily as needed for cough. 06/26/17   Waynetta Pean, PA-C  fluticasone (FLONASE) 50 MCG/ACT nasal spray Place 2 sprays into both nostrils daily. 06/26/17   Waynetta Pean, PA-C  loratadine (CLARITIN) 10 MG tablet Take 1 tablet (10 mg total) by mouth daily. 06/26/17   Waynetta Pean, PA-C  Multiple Vitamin (MULTIVITAMIN WITH MINERALS) TABS tablet Take 1 tablet by mouth  daily.    [provider]  naproxen (NAPROSYN) 375 MG tablet Take 1 tablet (375 mg total) by mouth 2 (two) times daily with a meal. 06/26/17   Waynetta Pean, PA-C  omeprazole (PRILOSEC) 20 MG capsule Take 20 mg by mouth daily as needed (Acid Reflux).    [provider]    Family History Family History  Problem Relation Age of Onset  . Hypertension Mother   . Diabetes Mother   . Hyperlipidemia Mother   . Hypertension Father   . Hyperlipidemia Father   . Heart attack Father   . Sarcoidosis Sister   . Thyroid disease Sister   . Hyperlipidemia Sister   . Hypertension Sister     Social History Social History   Tobacco Use  . Smoking status: Never Smoker  . Smokeless tobacco: Never Used  Substance Use Topics  . Alcohol use: No  . Drug use: No     Allergies   Prednisone   Review of Systems Review of Systems  Constitutional: Positive for chills. Negative for fever.  HENT: Positive for congestion, ear pain, postnasal drip, rhinorrhea, sinus pressure, sinus pain, sneezing and sore throat. Negative for ear discharge, facial swelling and trouble swallowing.   Eyes: Negative for redness and visual disturbance.  Respiratory: Positive for cough and wheezing. Negative for shortness of breath.   Cardiovascular: Negative for chest pain.  Gastrointestinal: Negative for abdominal pain and vomiting.  Musculoskeletal: Negative for neck pain and neck stiffness.  Skin: Negative for rash.  Neurological: Negative for syncope and light-headedness.     Physical Exam Updated Vital Signs BP (!) 158/92 (BP Location: Right Arm)   Pulse 97   Temp 99.1 F (37.3 C) (Oral)   Resp 20   Ht 5' (1.524 m)   Wt 95.3 kg (210 lb)   SpO2 98%   BMI 41.01 kg/m   Physical Exam  Constitutional: She appears well-developed and well-nourished. No distress.  Nontoxic appearing.  HENT:  Head: Normocephalic and atraumatic.  Mouth/Throat: No oropharyngeal exudate.  Rhinorrhea and  boggy nasal turbinates bilaterally. Mild clear middle ear effusion noted bilaterally without TM erythema or loss of landmarks. Mild posterior oropharyngeal erythema without edema.  Uvula is midline without edema.  No peritonsillar abscess.  No tonsillar exudate.  Maxillary sinus tenderness to palpation.   Eyes: Conjunctivae are normal. Pupils are equal, round, and reactive to light. Right eye exhibits no discharge. Left eye exhibits no discharge.  Neck: Neck supple. No JVD present. No tracheal deviation present.  Cardiovascular: Normal rate, regular rhythm, normal heart sounds and intact distal pulses. Exam reveals no gallop and no friction rub.  No murmur heard. Pulmonary/Chest: Effort normal. No stridor. No respiratory distress. She has wheezes. She has no rales.  Faint expiratory wheeze noted bilaterally.  No increased work of breathing.  Symmetric chest expansion bilaterally.  No rales or rhonchi.  Abdominal: Soft. There is no tenderness.  Musculoskeletal: She exhibits no edema.  Lymphadenopathy:    She has no cervical adenopathy.  Neurological: She is alert. Coordination normal.  Skin: Skin is warm and dry. No rash noted. She is not diaphoretic. No erythema. No pallor.  Psychiatric: She has a normal mood and affect. Her behavior is normal.  Nursing note and vitals reviewed.    ED Treatments / Results  Labs (all labs ordered are listed, but only abnormal results are displayed) Labs Reviewed - No data to display  EKG  EKG Interpretation None       Radiology No results found.  Procedures Procedures (including critical care time)  Medications Ordered in ED Medications  albuterol (PROVENTIL HFA;VENTOLIN HFA) 108 (90 Base) MCG/ACT inhaler 2 puff (not administered)  AEROCHAMBER PLUS FLO-VU LARGE MISC 1 each (not administered)  acetaminophen (TYLENOL) tablet 650 mg (not administered)     Initial Impression / Assessment and Plan / ED Course  I have reviewed the triage  vital signs and the nursing notes.  Pertinent labs & imaging results that were available during my care of the patient were reviewed by me and considered in my medical decision making (see chart for details).    This is a 37 y.o. Female who presents to the emergency department complaining of 2 days of sneezing, nasal congestion, postnasal drip, ear pressure, sore throat, and coughing.  Patient reports her symptoms began around 2 days ago.  She has had some chills but no fever.  No antipyretics prior to arrival.  She took a cough suppressant earlier this morning with little relief.  She reports she takes Zyrtec daily for years.  This is not helped much today.  She reports using Flonase intermittently, but none today.  She did receive her flu shot this year.  She reports some slight wheezing intermittently and reports a history of asthma.  She denies feeling short of breath.  She does have trouble breathing through her nose.  No trouble swallowing. On exam the patient is afebrile and nontoxic-appearing.  She has evidence of an upper respiratory infection with rhinorrhea and boggy nasal turbinates.  She has some slight expiratory wheeze noted bilaterally.  No increased work of breathing.  No rales or rhonchi.  No tonsillar hypertrophy or exudate on throat exam.  Patient with acute nasopharyngitis.  I encouraged her to start using her Flonase consistently.  Will switch from Zyrtec to alerted on as this is not been working well for her.  Naproxen and Tessalon Perles as well at discharge.  I discussed methods to help with her nasal congestion and postnasal drip.  I discussed return precautions with the patient. I advised the patient to follow-up with their primary care provider this week. I advised the patient to return to the emergency department with new or worsening symptoms or new concerns. The patient verbalized understanding and agreement with plan.      Final Clinical Impressions(s) / ED Diagnoses    Final diagnoses:  Acute nasopharyngitis  Viral URI with cough    ED Discharge Orders        Ordered    fluticasone (FLONASE) 50 MCG/ACT nasal spray  Daily     06/26/17 0414    loratadine (CLARITIN) 10 MG tablet  Daily     06/26/17 0414    benzonatate (TESSALON) 100 MG capsule  3 times daily PRN     06/26/17 0414  naproxen (NAPROSYN) 375 MG tablet  2 times daily with meals     06/26/17 0414       Waynetta Pean, PA-C 06/26/17 Cresenciano Lick, MD 06/26/17 [phone]

## 2017-06-26 NOTE — ED Notes (Signed)
Pt. alert & interactive during discharge; pt. ambulatory to exit 

## 2017-06-26 NOTE — ED Triage Notes (Signed)
Pt reports cough, nasal congestion, HA, body aches X2 days. Pt believes it is a sinus infection.

## 2017-07-06 ENCOUNTER — Other Ambulatory Visit: Payer: BLUE CROSS/BLUE SHIELD

## 2017-07-08 ENCOUNTER — Other Ambulatory Visit: Payer: BLUE CROSS/BLUE SHIELD | Admitting: Adult Health

## 2017-07-13 ENCOUNTER — Ambulatory Visit (INDEPENDENT_AMBULATORY_CARE_PROVIDER_SITE_OTHER): Payer: BLUE CROSS/BLUE SHIELD

## 2017-07-13 DIAGNOSIS — R102 Pelvic and perineal pain: Secondary | ICD-10-CM

## 2017-07-13 NOTE — Progress Notes (Addendum)
PELVIC US TA/TV: normal cervical stump,bilateral enlarged ovaries,ovaries appear mobile,no free fluid,pelvic discomfort during ultrasound

## 2017-07-16 ENCOUNTER — Encounter: Payer: Self-pay | Admitting: Adult Health

## 2017-07-16 ENCOUNTER — Other Ambulatory Visit (HOSPITAL_COMMUNITY)
Admission: RE | Admit: 2017-07-16 | Discharge: 2017-07-16 | Disposition: A | Discharge status: Home / Self Care | Payer: BLUE CROSS/BLUE SHIELD | Source: Ambulatory Visit | Attending: Adult Health | Admitting: Adult Health

## 2017-07-16 ENCOUNTER — Ambulatory Visit (INDEPENDENT_AMBULATORY_CARE_PROVIDER_SITE_OTHER): Payer: BLUE CROSS/BLUE SHIELD | Admitting: Adult Health

## 2017-07-16 VITALS — BP 110/80 | HR 84 | Ht 60.0 in | Wt 202.0 lb

## 2017-07-16 DIAGNOSIS — Z01419 Encounter for gynecological examination (general) (routine) without abnormal findings: Secondary | ICD-10-CM | POA: Insufficient documentation

## 2017-07-16 NOTE — Progress Notes (Signed)
Patient ID: Jasmin Chen, female   DOB: July 16, 1980, 37 y.o.   MRN: [id number] History of Present Illness: Jasmin Chen is a 37 year old black female in for well woman gyn exam and pap, she is sp [facility].She had [place] 07/13/17 for pelvic pain, she said ovaries felt swollen. PCP is Dr Sallee Lange.  Current Medications, Allergies, Past Medical History, Past Surgical History, Family History and Social History were reviewed in [employer] record.     Review of Systems: Patient denies any headaches, hearing loss, fatigue, blurred vision, shortness of breath, chest pain, abdominal pain, problems with bowel movements, urination, or intercourse(not having sex). No joint pain or mood swings. She had sleeve surgery in 2016 and breast reduction in 2009.    Physical Exam:BP 110/80 (BP Location: Left Arm, Patient Position: Sitting, Cuff Size: Large)   Pulse 84   Ht 5' (1.524 m)   Wt 202 lb (91.6 kg)   BMI 39.45 kg/m  General:  Well developed, well nourished, no acute distress Skin:  Warm and dry Neck:  Midline trachea, normal thyroid, good ROM, no lymphadenopathy Lungs; Clear to auscultation bilaterally Breast:  No dominant palpable mass, retraction, or nipple discharge,sp berast reduction in about 2009 Cardiovascular: Regular rate and rhythm Abdomen:  Soft, non tender, no hepatosplenomegaly Pelvic:  External genitalia is normal in appearance, no lesions.  The vagina is normal in appearance. Urethra has no lesions or masses. The cervix is smooth, pap with HPV performed. Uterus is absent.  No adnexal masses or tenderness noted.Bladder is non tender, no masses felt. Extremities/musculoskeletal:  No swelling or varicosities noted, no clubbing or cyanosis Psych:  No mood changes, alert and cooperative,seems happy PHQ 2 score 0. Reviewed [place] with Dr Glo Herring and he says ovaries minimally enlarged but symmetric and look normal.cervical stump normal.   Impression: 1. Encounter for  gynecological examination with Papanicolaou smear of cervix       Plan: Physical in 1 year Pap in 3 if normal Mammogram at 39  Labs with PCP

## 2017-07-20 ENCOUNTER — Encounter: Payer: Self-pay | Admitting: Adult Health

## 2017-07-20 ENCOUNTER — Telehealth: Payer: Self-pay | Admitting: Adult Health

## 2017-07-20 DIAGNOSIS — R87810 Cervical high risk human papillomavirus (HPV) DNA test positive: Secondary | ICD-10-CM

## 2017-07-20 HISTORY — DX: Cervical high risk human papillomavirus (HPV) DNA test positive: R87.810

## 2017-07-20 LAB — CYTOLOGY - PAP
Diagnosis: NEGATIVE
HPV: DETECTED — AB

## 2017-07-20 NOTE — Telephone Encounter (Signed)
Left pt message that pap was negative for malignancy but +HPV, need to repeat in 1 year, call with any questions.

## 2017-12-13 DIAGNOSIS — J019 Acute sinusitis, unspecified: Secondary | ICD-10-CM | POA: Diagnosis not present

## 2017-12-28 ENCOUNTER — Emergency Department (HOSPITAL_COMMUNITY)
Admission: EM | Admit: 2017-12-28 | Discharge: 2017-12-28 | Disposition: A | Discharge status: Home / Self Care | Payer: BLUE CROSS/BLUE SHIELD

## 2017-12-28 NOTE — ED Triage Notes (Signed)
Registration notified triage RN that pt just left prior to being called for triage.

## 2017-12-29 DIAGNOSIS — L309 Dermatitis, unspecified: Secondary | ICD-10-CM | POA: Diagnosis not present

## 2017-12-29 DIAGNOSIS — L03114 Cellulitis of left upper limb: Secondary | ICD-10-CM | POA: Diagnosis not present

## 2018-01-17 ENCOUNTER — Ambulatory Visit: Payer: BLUE CROSS/BLUE SHIELD | Admitting: Family Medicine

## 2018-02-09 ENCOUNTER — Other Ambulatory Visit: Payer: Self-pay | Admitting: Family Medicine

## 2018-05-31 ENCOUNTER — Ambulatory Visit: Payer: BLUE CROSS/BLUE SHIELD | Admitting: Family Medicine

## 2018-05-31 ENCOUNTER — Encounter: Payer: Self-pay | Admitting: Family Medicine

## 2018-05-31 VITALS — BP 110/80 | Temp 99.3°F | Ht 60.0 in | Wt 217.0 lb

## 2018-05-31 DIAGNOSIS — J329 Chronic sinusitis, unspecified: Secondary | ICD-10-CM

## 2018-05-31 DIAGNOSIS — J310 Chronic rhinitis: Secondary | ICD-10-CM

## 2018-05-31 MED ORDER — ONDANSETRON 4 MG PO TBDP: 4.0000 mg | ORAL_TABLET | Freq: Three times a day (TID) | ORAL | 0 refills | Status: DC | PRN

## 2018-05-31 MED ORDER — AMOXICILLIN-POT CLAVULANATE 875-125 MG PO TABS
ORAL_TABLET | ORAL | 0 refills | Status: DC
Start: 2018-05-31 — End: 2018-12-22

## 2018-05-31 NOTE — Progress Notes (Signed)
   Subjective:    Patient ID: Jasmin Chen, female    DOB: Nov 02, 1980, 37 y.o.   MRN: [id number]  Sinusitis  This is a new problem. Episode onset: 3 days. Associated symptoms include chills, congestion, coughing, ear pain, headaches and a sore throat. (Abdominal pain, body aches ) Treatments tried: nyquil, thera flu.   Hit with sore throat o nsunday   Then went to an earache  Then a headache  Now body aching , neck pain , sotomach hurts   Aching all over  Cough,  Some yellowish phlegm   Energy level poor  appetit4 no,  Not    Feeling der nausea       Review of Systems  Constitutional: Positive for chills.  HENT: Positive for congestion, ear pain and sore throat.   Respiratory: Positive for cough.   Neurological: Positive for headaches.       Objective:   Physical Exam  Alert, mild malaise. Hydration good Vitals stable. frontal/ maxillary tenderness evident positive nasal congestion. pharynx normal neck supple  lungs clear/no crackles or wheezes. heart regular in rhythm       Assessment & Plan:  Impression rhinosinusitis likely post viral, even eventually the flu will initiate this discussed unfortunately patient unable to get flu shot this year, discussed with patient. plan antibiotics prescribed. Questions answered. Symptomatic care discussed. warning signs discussed. WSL

## 2018-07-27 DIAGNOSIS — E569 Vitamin deficiency, unspecified: Secondary | ICD-10-CM | POA: Diagnosis not present

## 2018-07-27 DIAGNOSIS — Z713 Dietary counseling and surveillance: Secondary | ICD-10-CM | POA: Diagnosis not present

## 2018-07-27 DIAGNOSIS — Z9884 Bariatric surgery status: Secondary | ICD-10-CM | POA: Diagnosis not present

## 2018-07-27 DIAGNOSIS — E669 Obesity, unspecified: Secondary | ICD-10-CM | POA: Diagnosis not present

## 2018-07-27 DIAGNOSIS — Z6841 Body Mass Index (BMI) 40.0 and over, adult: Secondary | ICD-10-CM | POA: Diagnosis not present

## 2018-08-28 ENCOUNTER — Emergency Department (HOSPITAL_COMMUNITY)
Admission: EM | Admit: 2018-08-28 | Discharge: 2018-08-28 | Disposition: A | Discharge status: Home / Self Care | Payer: BLUE CROSS/BLUE SHIELD | Attending: Emergency Medicine | Admitting: Emergency Medicine

## 2018-08-28 ENCOUNTER — Other Ambulatory Visit: Payer: Self-pay

## 2018-08-28 ENCOUNTER — Encounter (HOSPITAL_COMMUNITY): Payer: Self-pay | Admitting: Emergency Medicine

## 2018-08-28 DIAGNOSIS — I10 Essential (primary) hypertension: Secondary | ICD-10-CM | POA: Insufficient documentation

## 2018-08-28 DIAGNOSIS — L02411 Cutaneous abscess of right axilla: Secondary | ICD-10-CM | POA: Insufficient documentation

## 2018-08-28 DIAGNOSIS — R2231 Localized swelling, mass and lump, right upper limb: Secondary | ICD-10-CM | POA: Diagnosis not present

## 2018-08-28 DIAGNOSIS — Z79899 Other long term (current) drug therapy: Secondary | ICD-10-CM | POA: Diagnosis not present

## 2018-08-28 DIAGNOSIS — J45909 Unspecified asthma, uncomplicated: Secondary | ICD-10-CM | POA: Insufficient documentation

## 2018-08-28 MED ORDER — LIDOCAINE-EPINEPHRINE 2 %-1:200000 IJ SOLN
10.0000 mL | Freq: Once | INTRAMUSCULAR | Status: AC
Start: 2018-08-28 — End: 2018-08-28
  Administered 2018-08-28: 10 mL via INTRADERMAL
  Filled 2018-08-28: qty 10

## 2018-08-28 MED ORDER — SULFAMETHOXAZOLE-TRIMETHOPRIM 800-160 MG PO TABS: 1.0000 | ORAL_TABLET | Freq: Two times a day (BID) | ORAL | 0 refills | Status: AC

## 2018-08-28 MED ORDER — HYDROCODONE-ACETAMINOPHEN 5-325 MG PO TABS: ORAL_TABLET | ORAL | 0 refills | Status: DC

## 2018-08-28 MED ORDER — POVIDONE-IODINE 10 % EX SOLN
CUTANEOUS | Status: DC | PRN
  Administered 2018-08-28: 13:00:00 via TOPICAL
  Filled 2018-08-28: qty 15

## 2018-08-28 NOTE — ED Triage Notes (Signed)
Patient c/o abscess to right axillary that she noticed this morning. Denies any fevers or drainage.

## 2018-08-28 NOTE — Discharge Instructions (Addendum)
Apply warm wet compresses 2-3 times a day to the right underarm.  The packing will need to be removed in 2 days.  You may remove it with a pair tweezers.  If you are having any worsening pain, redness, fever or chills return to the emergency room for evaluation.  Take the antibiotic as directed until its finished.

## 2018-08-29 NOTE — ED Provider Notes (Signed)
[facility] EMERGENCY DEPARTMENT Provider Note   CSN: [id number] Arrival date & time: 08/28/18  1235    History   Chief Complaint Chief Complaint  Patient presents with  . Abscess    HPI Jasmin Chen is a 38 y.o. female.     HPI  Jasmin Chen is a 38 y.o. female who presents to the Emergency Department complaining of pain, redness and swelling to the right axilla.  Noticed symptoms on the same day as arrival.  She reports hx of previous abscess to the same area.  She describes pain to palpation of the area and movement of the right arm.  She denies drainage, fever, chills, and new deodorants. No hx of DM    Past Medical History:  Diagnosis Date  . Asthma   . Chronic knee pain   . Chronic rhinitis   . Hidradenitis suppurativa   . Hypertension   . IBS (irritable bowel syndrome)   . Migraine without aura 2009  . Multiple environmental allergies   . Other and unspecified ovarian cyst 02/06/2013  . Ovarian cyst   . Papanicolaou smear of cervix with positive high risk human papilloma virus (HPV) test 07/20/2017  . Right leg weakness    chronic    Patient Active Problem List   Diagnosis Date Noted  . Papanicolaou smear of cervix with positive high risk human papilloma virus (HPV) test 07/20/2017  . Rapid palpitations 04/26/2017  . History of syncope 04/26/2017  . 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 Left ovarian cyst 03/11/2014  . Ovarian cyst 03/11/2014  . Knee pain 10/10/2013  . Stiffness of joint, not elsewhere classified, lower leg 10/10/2013  . Weakness of right leg 10/10/2013  . Other and unspecified ovarian cyst 03/20/2013  . Other and unspecified ovarian cyst 02/06/2013  . Other and unspecified ovarian cyst 02/06/2013  . IBS (irritable bowel syndrome) 02/03/2013  . Acid reflux 09/28/2012  . HLD (hyperlipidemia) 09/28/2012  . Benign hypertension 09/28/2012  . Hidradenitis suppurativa 09/19/2012  . Morbid obesity ([place]) 09/19/2012  . RECTAL BLEEDING 08/20/2010   . ABDOMINAL PAIN-LLQ 08/20/2010    Past Surgical History:  Procedure Laterality Date  . 2 cysts removed from arm    . ABDOMINAL HYSTERECTOMY    . BREAST REDUCTION SURGERY    . deviated septum repair    . hemroid surgery    . LAPAROSCOPIC GASTRIC SLEEVE RESECTION       OB History    Gravida  0   Para  0   Term  0   Preterm  0   AB  0   Living  0     SAB  0   TAB  0   Ectopic  0   Multiple  0   Live Births  0            Home Medications    Prior to Admission medications   Medication Sig Start Date End Date Taking? Authorizing Provider  albuterol (PROVENTIL HFA;VENTOLIN HFA) 108 (90 Base) MCG/ACT inhaler Inhale 2 puffs into the lungs every 4 (four) hours as needed for wheezing or shortness of breath. 09/20/15   Mikey Kirschner, MD  albuterol (PROVENTIL) (2.5 MG/3ML) 0.083% nebulizer solution Take 3 mLs (2.5 mg total) by nebulization every 4 (four) hours as needed for wheezing or shortness of breath. 09/20/15   Mikey Kirschner, MD  ALPRAZolam Duanne Moron) 0.25 MG tablet Take 0.125-0.25 mg by mouth at bedtime as needed for anxiety.    [provider]  amoxicillin-clavulanate (AUGMENTIN) 875-125 MG tablet One p o bid for ten days 05/31/18   Mikey Kirschner, MD  fluticasone [facility]) 50 MCG/ACT nasal spray Place 2 sprays into both nostrils daily. 06/26/17   Waynetta Pean, PA-C  HYDROcodone-acetaminophen (NORCO/VICODIN) 5-325 MG tablet Take one tab po q 4 hrs prn pain 08/28/18   Seleta Hovland, PA-C  loratadine (CLARITIN) 10 MG tablet Take 1 tablet (10 mg total) by mouth daily. 06/26/17   Waynetta Pean, PA-C  Multiple Vitamin (MULTIVITAMIN WITH MINERALS) TABS tablet Take 1 tablet by mouth daily.    [provider]  naproxen (NAPROSYN) 375 MG tablet Take 1 tablet (375 mg total) by mouth 2 (two) times daily with a meal. 06/26/17   Waynetta Pean, PA-C  naproxen (NAPROSYN) 500 MG tablet 1 TABLET BY MOUTH TWICE A DAY FOR PAIN AND SWELLING 02/10/18    Mikey Kirschner, MD  omeprazole (PRILOSEC) 20 MG capsule Take 20 mg by mouth daily as needed (Acid Reflux).    [provider]  ondansetron (ZOFRAN ODT) 4 MG disintegrating tablet Take 1 tablet (4 mg total) by mouth every 8 (eight) hours as needed for nausea or vomiting. 05/31/18   Mikey Kirschner, MD  sulfamethoxazole-trimethoprim (BACTRIM DS,SEPTRA DS) 800-160 MG tablet Take 1 tablet by mouth 2 (two) times daily for 10 days. 08/28/18 09/07/18  Kem Parkinson, PA-C    Family History Family History  Problem Relation Age of Onset  . Hypertension Mother   . Diabetes Mother   . Hyperlipidemia Mother   . Hypertension Father   . Hyperlipidemia Father   . Heart attack Father   . Sarcoidosis Sister   . Thyroid disease Sister   . Hyperlipidemia Sister   . Hypertension Sister     Social History Social History   Tobacco Use  . Smoking status: Never Smoker  . Smokeless tobacco: Never Used  Substance Use Topics  . Alcohol use: No  . Drug use: No     Allergies   Prednisone   Review of Systems Review of Systems  Constitutional: Negative for chills and fever.  Respiratory: Negative for shortness of breath.   Cardiovascular: Negative for chest pain.  Gastrointestinal: Negative for nausea and vomiting.  Musculoskeletal: Negative for arthralgias and neck pain.  Skin: Positive for color change.       Redness, swelling and pain to the right axilla.  Neurological: Negative for headaches.  Hematological: Negative for adenopathy.     Physical Exam Updated Vital Signs BP 124/84 (BP Location: Right Arm)   Pulse 70   Temp 97.9 F (36.6 C) (Oral)   Resp 16   Ht 5' (1.524 m)   Wt 95.3 kg   SpO2 100%   BMI 41.01 kg/m   Physical Exam Vitals signs and nursing note reviewed.  Constitutional:      General: She is not in acute distress.    Appearance: Normal appearance. She is well-developed.  HENT:     Head: Normocephalic.  Neck:     Musculoskeletal: No neck  rigidity or muscular tenderness.  Cardiovascular:     Rate and Rhythm: Normal rate and regular rhythm.     Pulses: Normal pulses.     Heart sounds: No murmur.  Pulmonary:     Effort: Pulmonary effort is normal. No respiratory distress.     Breath sounds: Normal breath sounds.  Musculoskeletal: Normal range of motion.  Lymphadenopathy:     Cervical: No cervical adenopathy.  Skin:  General: Skin is warm and dry.     Capillary Refill: Capillary refill takes less than 2 seconds.     Findings: Erythema present.     Comments: Focal area of erythema and induration to the right axilla.  Sx's present overlying an old well healed surgical scar.  No lymphangitis. No drainage  Neurological:     Mental Status: She is alert and oriented to person, place, and time.     Sensory: No sensory deficit.     Motor: No weakness or abnormal muscle tone.      ED Treatments / Results  Labs (all labs ordered are listed, but only abnormal results are displayed) Labs Reviewed - No data to display  EKG None  Radiology No results found.  Procedures Procedures (including critical care time)  INCISION AND DRAINAGE Performed by: Hasson Gaspard Consent: Verbal consent obtained. Risks and benefits: risks, benefits and alternatives were discussed Type: abscess  Body area: right axilla  Anesthesia: local infiltration  Incision was made with a #11 scalpel.  Local anesthetic: lidocaine 2% w/ epinephrine  Anesthetic total: 3 ml  Complexity: complex Blunt dissection to break up loculations  Drainage: purulent  Drainage amount: mild to moderate  Packing material: 1/4 in iodoform gauze  Patient tolerance: Patient tolerated the procedure well with no immediate complications.     Medications Ordered in ED Medications  lidocaine-EPINEPHrine (XYLOCAINE W/EPI) 2 %-1:200000 (PF) injection 10 mL (10 mLs Intradermal Given 08/28/18 1314)     Initial Impression / Assessment and Plan / ED Course   I have reviewed the triage vital signs and the nursing notes.  Pertinent labs & imaging results that were available during my care of the patient were reviewed by me and considered in my medical decision making (see chart for details).        Pt with recurrent abscess to the right axilla.  No lymphangitis.  Pt is well appearing.  Successful I&D.  Packing placed.  Pt agrees to care plan with warm compresses and packing removal in 2 days. rx for abx and short course of pain medication.  Return precautions discussed.      Final Clinical Impressions(s) / ED Diagnoses   Final diagnoses:  Abscess of axilla, right    ED Discharge Orders         Ordered    sulfamethoxazole-trimethoprim (BACTRIM DS,SEPTRA DS) 800-160 MG tablet  2 times daily     08/28/18 1319    HYDROcodone-acetaminophen (NORCO/VICODIN) 5-325 MG tablet     08/28/18 1319           Deniqua Perry, [place], PA-C 08/29/18 2209    Nat Christen, MD 08/29/18 2332

## 2018-10-03 ENCOUNTER — Telehealth: Payer: Self-pay | Admitting: Family Medicine

## 2018-10-03 MED ORDER — ALBUTEROL SULFATE (2.5 MG/3ML) 0.083% IN NEBU: 2.5000 mg | INHALATION_SOLUTION | RESPIRATORY_TRACT | 5 refills | Status: DC | PRN

## 2018-10-03 MED ORDER — ALBUTEROL SULFATE HFA 108 (90 BASE) MCG/ACT IN AERS: 2.0000 | INHALATION_SPRAY | RESPIRATORY_TRACT | 5 refills | Status: DC | PRN

## 2018-10-03 NOTE — Telephone Encounter (Signed)
Albuterol  Refill (Mom said she was at work and wasn't sure if she could do a phone visit today.)  [facility] [place] [facility] [address].  Patient's direct [phone]

## 2018-10-03 NOTE — Telephone Encounter (Signed)
Pt is needing albuterol (PROVENTIL) (2.5 MG/3ML) 0.083% nebulizer solution sent to pharmacy not inhaler please.   [id number] - [place], [place].

## 2018-10-03 NOTE — Telephone Encounter (Signed)
Prescription sent electronically to pharmacy. 

## 2018-10-03 NOTE — Telephone Encounter (Signed)
You can do this 6 times

## 2018-10-03 NOTE — Telephone Encounter (Signed)
May have 5 refills 

## 2018-10-03 NOTE — Telephone Encounter (Signed)
Prescription sent electronically to pharmacy. Patient notified. 

## 2018-12-22 ENCOUNTER — Other Ambulatory Visit: Payer: Self-pay

## 2018-12-22 ENCOUNTER — Ambulatory Visit
Admission: EM | Admit: 2018-12-22 | Discharge: 2018-12-22 | Disposition: A | Discharge status: Home / Self Care | Payer: BC Managed Care – PPO | Attending: Emergency Medicine | Admitting: Emergency Medicine

## 2018-12-22 DIAGNOSIS — Z20822 Contact with and (suspected) exposure to covid-19: Secondary | ICD-10-CM

## 2018-12-22 DIAGNOSIS — Z20828 Contact with and (suspected) exposure to other viral communicable diseases: Secondary | ICD-10-CM

## 2018-12-22 DIAGNOSIS — K529 Noninfective gastroenteritis and colitis, unspecified: Secondary | ICD-10-CM

## 2018-12-22 LAB — POCT URINALYSIS DIP (MANUAL ENTRY)
Bilirubin, UA: NEGATIVE
Blood, UA: NEGATIVE
Glucose, UA: NEGATIVE mg/dL
Ketones, POC UA: NEGATIVE mg/dL
Leukocytes, UA: NEGATIVE
Nitrite, UA: NEGATIVE
Protein Ur, POC: NEGATIVE mg/dL
Spec Grav, UA: 1.02 (ref 1.010–1.025)
Urobilinogen, UA: 0.2 E.U./dL
pH, UA: 7.5 (ref 5.0–8.0)

## 2018-12-22 MED ORDER — ONDANSETRON HCL 4 MG PO TABS: 4.0000 mg | ORAL_TABLET | Freq: Four times a day (QID) | ORAL | 0 refills | Status: DC

## 2018-12-22 NOTE — ED Provider Notes (Signed)
[facility]   [id number] 12/22/18 Arrival Time: 63  CC: ABDOMINAL DISCOMFORT, N/V/D, and COVID exposure  SUBJECTIVE:  Jasmin Chen is a 38 y.o. female hx significant for asthma, HS, HTN, IBS, ovarian cyst, and abnormal PAP, who presents with complaint of abdominal discomfort, nausea, vomiting x 6 episodes, and watery/ loose stools every 10-15 minutes over a course of 24 hours, 3 days ago.  Symptoms began after eating [place] food on Monday, no other close contacts with similar symptoms, lives alone.  Symptoms improved, but now complains of sinus congestion and reoccurring diarrhea/ loose stool for the past day.  Has had approximately 10 episodes of diarrhea/ loose stools over the past 24 hours.  Admits to possible exposure to COVID at work.  Complains of associated constant sharp pain over the periumbilical region.  Has tried OTC robitussin with relief of sinus congestion.  Denies alleviating or aggravating factors.  Denies similar symptoms in the past.  Last BM today with diarrhea.  Complains of decreased appetite, and slight nausea.  Denies fever, chills, ear pain, rhinorrhea, sore throat, cough, chest pain, SOB, constipation, hematochezia, melena, dysuria, difficulty urinating, increased frequency or urgency, flank pain, loss of bowel or bladder function, vaginal discharge, vaginal odor, vaginal bleeding, dyspareunia, pelvic pain.     No LMP recorded. Patient has had a hysterectomy.  ROS: As per HPI.  Past Medical History:  Diagnosis Date  . Asthma   . Chronic knee pain   . Chronic rhinitis   . Hidradenitis suppurativa   . Hypertension   . IBS (irritable bowel syndrome)   . Migraine without aura 2009  . Multiple environmental allergies   . Other and unspecified ovarian cyst 02/06/2013  . Ovarian cyst   . Papanicolaou smear of cervix with positive high risk human papilloma virus (HPV) test 07/20/2017  . Right leg weakness    chronic   Past Surgical History:  Procedure  Laterality Date  . 2 cysts removed from arm    . ABDOMINAL HYSTERECTOMY    . BREAST REDUCTION SURGERY    . deviated septum repair    . hemroid surgery    . LAPAROSCOPIC GASTRIC SLEEVE RESECTION     Allergies  Allergen Reactions  . Prednisone Other (See Comments)    Difficulty concentrating and functioning   No current facility-administered medications on file prior to encounter.    Current Outpatient Medications on File Prior to Encounter  Medication Sig Dispense Refill  . albuterol (PROVENTIL) (2.5 MG/3ML) 0.083% nebulizer solution Take 3 mLs (2.5 mg total) by nebulization every 4 (four) hours as needed for wheezing or shortness of breath. 75 mL 5  . albuterol (VENTOLIN HFA) 108 (90 Base) MCG/ACT inhaler Inhale 2 puffs into the lungs every 4 (four) hours as needed for wheezing or shortness of breath. 1 Inhaler 5  . ALPRAZolam (XANAX) 0.25 MG tablet Take 0.125-0.25 mg by mouth at bedtime as needed for anxiety.    . fluticasone (FLONASE) 50 MCG/ACT nasal spray Place 2 sprays into both nostrils daily. 16 g 0  . loratadine (CLARITIN) 10 MG tablet Take 1 tablet (10 mg total) by mouth daily. 30 tablet 0  . Multiple Vitamin (MULTIVITAMIN WITH MINERALS) TABS tablet Take 1 tablet by mouth daily.    Marland Kitchen omeprazole (PRILOSEC) 20 MG capsule Take 20 mg by mouth daily as needed (Acid Reflux).     Social History   Socioeconomic History  . Marital status: Single    Spouse name: Not on file  .  Number of children: Not on file  . Years of education: Not on file  . Highest education level: Not on file  Occupational History  . Not on file  Social Needs  . Financial resource strain: Not on file  . Food insecurity    Worry: Not on file    Inability: Not on file  . Transportation needs    Medical: Not on file    Non-medical: Not on file  Tobacco Use  . Smoking status: Never Smoker  . Smokeless tobacco: Never Used  Substance and Sexual Activity  . Alcohol use: No  . Drug use: No  . Sexual  activity: Not Currently    Comment: hysterectomy  Lifestyle  . Physical activity    Days per week: Not on file    Minutes per session: Not on file  . Stress: Not on file  Relationships  . Social Herbalist on phone: Not on file    Gets together: Not on file    Attends religious service: Not on file    Active member of club or organization: Not on file    Attends meetings of clubs or organizations: Not on file    Relationship status: Not on file  . Intimate partner violence    Fear of current or ex partner: Not on file    Emotionally abused: Not on file    Physically abused: Not on file    Forced sexual activity: Not on file  Other Topics Concern  . Not on file  Social History Narrative  . Not on file   Family History  Problem Relation Age of Onset  . Hypertension Mother   . Diabetes Mother   . Hyperlipidemia Mother   . Hypertension Father   . Hyperlipidemia Father   . Heart attack Father   . Sarcoidosis Sister   . Thyroid disease Sister   . Hyperlipidemia Sister   . Hypertension Sister      OBJECTIVE:  Vitals:   12/22/18 1457  BP: 119/83  Pulse: 78  Resp: 20  Temp: 98.2 F (36.8 C)  SpO2: 97%    General appearance: Alert; NAD HEENT: NCAT; PERRL, EOMI grossly; Oropharynx clear.  Lungs: clear to auscultation bilaterally without adventitious breath sounds Heart: regular rate and rhythm.  Abdomen: soft, non-distended; normal active bowel sounds; mildly TTP over suprapubic region; nontender at McBurney's point; negative Murphy's sign; negative rebound; no guarding Extremities: no edema; symmetrical with no gross deformities Skin: warm and dry Neurologic: normal gait Psychological: alert and cooperative; normal mood and affect  LABS: Results for orders placed or performed during the hospital encounter of 12/22/18 (from the past 24 hour(s))  POCT urinalysis dipstick     Status: None   Collection Time: 12/22/18  3:22 PM  Result Value Ref Range    Color, UA yellow yellow   Clarity, UA clear clear   Glucose, UA negative negative mg/dL   Bilirubin, UA negative negative   Ketones, POC UA negative negative mg/dL   Spec Grav, UA 1.020 1.010 - 1.025   Blood, UA negative negative   pH, UA 7.5 5.0 - 8.0   Protein Ur, POC negative negative mg/dL   Urobilinogen, UA 0.2 0.2 or 1.0 E.U./dL   Nitrite, UA Negative Negative   Leukocytes, UA Negative Negative    ASSESSMENT & PLAN:  1. Gastroenteritis   2. Exposure to Covid-19 Virus    Meds ordered this encounter  Medications  . ondansetron (ZOFRAN) 4 MG tablet  Sig: Take 1 tablet (4 mg total) by mouth every 6 (six) hours.    Dispense:  12 tablet    Refill:  0    Order Specific Question:   Supervising Provider    Answer:   Raylene Everts [[id number]]   Viral GI bug: Symptoms most likely due to viral gastroenteritis Urine did not show signs of infection Offered further evaluation in the ED given abdominal discomfort. We cannot rule out appendicitis, gallbladder disease, ovarian torsion, or infection in urgent care setting.  Patient declines at this time and would like to try outpatient therapy first.  She is aware of the risk associated with this decision including organ damage, failure and/or death.    Zofran prescribed.  Take as directed.   DIET Instructions:  30 minutes after taking nausea medicine, begin with sips of clear liquids. If able to hold down 2 - 4 ounces for 30 minutes, begin drinking more. Increase your fluid intake to replace losses.  You may supplement with pedialyte and/or OTC oral rehydration solution Advance to bland foods, applesauce, rice, baked or boiled chicken, ect. Avoid milk, greasy foods and anything that doesn't agree with you.  If you experience new or worsening symptoms return or go to ER such as fever, chills, nausea, vomiting, diarrhea, bloody or dark tarry stools, constipation, urinary symptoms, worsening abdominal discomfort, symptoms that do not  improve with medications, inability to keep fluids down, etc...  COVID exposure: Do to possible COVID exposure we will also be ordering testing today COVID testing ordered.  Outpatient center will contact you regarding your appointment  In the meantime: You should remain isolated in your home for 7 days from symptom onset AND greater than 72 hours after symptoms resolution (absence of fever without the use of fever-reducing medication and improvement in respiratory symptoms), whichever is longer You may use OTC zyrtec and/or flonase as needed for nasal congestion, runny nose, and/or sore throat Use OTC medications like tylenol as needed fever or pain Call or go to the ED if you have any new or worsening symptoms such as fever, worsening cough, shortness of breath, chest tightness, chest pain, turning blue, changes in mental status, etc...  Reviewed expectations re: course of current medical issues. Questions answered. Outlined signs and symptoms indicating need for more acute intervention. Patient verbalized understanding. After Visit Summary given.   Lestine Box, PA-C 12/22/18 1542

## 2018-12-22 NOTE — ED Triage Notes (Signed)
Pt states she had vomiting in the beginning of the week. Vomiting resolved on wednsday continues to have diarrhea and abdominal pain, request covid testing

## 2018-12-22 NOTE — Discharge Instructions (Signed)
Viral GI bug: Symptoms most likely due to viral gastroenteritis Urine did not show signs of infection Offered further evaluation in the ED given abdominal discomfort. We cannot rule out appendicitis, gallbladder disease, ovarian torsion, or infection in urgent care setting.  Patient declines at this time and would like to try outpatient therapy first.  She is aware of the risk associated with this decision including organ damage, failure and/or death.    Zofran prescribed.  Take as directed.   DIET Instructions:  30 minutes after taking nausea medicine, begin with sips of clear liquids. If able to hold down 2 - 4 ounces for 30 minutes, begin drinking more. Increase your fluid intake to replace losses.  You may supplement with pedialyte and/or OTC oral rehydration solution Advance to bland foods, applesauce, rice, baked or boiled chicken, ect. Avoid milk, greasy foods and anything that doesnt agree with you.  If you experience new or worsening symptoms return or go to ER such as fever, chills, nausea, vomiting, diarrhea, bloody or dark tarry stools, constipation, urinary symptoms, worsening abdominal discomfort, symptoms that do not improve with medications, inability to keep fluids down, etc...  COVID exposure: Do to possible COVID exposure we will also be ordering testing today COVID testing ordered.  Outpatient center will contact you regarding your appointment  In the meantime: You should remain isolated in your home for 7 days from symptom onset AND greater than 72 hours after symptoms resolution (absence of fever without the use of fever-reducing medication and improvement in respiratory symptoms), whichever is longer You may use OTC zyrtec and/or flonase as needed for nasal congestion, runny nose, and/or sore throat Use OTC medications like tylenol as needed fever or pain Call or go to the ED if you have any new or worsening symptoms such as fever, worsening cough, shortness of breath,  chest tightness, chest pain, turning blue, changes in mental status, etc..Marland Kitchen

## 2018-12-23 ENCOUNTER — Telehealth: Payer: Self-pay | Admitting: General Practice

## 2018-12-23 ENCOUNTER — Other Ambulatory Visit: Payer: BC Managed Care – PPO

## 2018-12-23 ENCOUNTER — Other Ambulatory Visit: Payer: Self-pay

## 2018-12-23 DIAGNOSIS — R6889 Other general symptoms and signs: Secondary | ICD-10-CM | POA: Diagnosis not present

## 2018-12-23 DIAGNOSIS — Z20822 Contact with and (suspected) exposure to covid-19: Secondary | ICD-10-CM

## 2018-12-23 NOTE — Telephone Encounter (Signed)
Pt has been scheduled for covid testing.  Pt was referred by: Stacey Drain, [place], PA-C

## 2018-12-30 LAB — NOVEL CORONAVIRUS, NAA: SARS-CoV-2, NAA: NOT DETECTED

## 2019-01-02 ENCOUNTER — Encounter (HOSPITAL_COMMUNITY): Payer: Self-pay

## 2019-05-04 ENCOUNTER — Ambulatory Visit: Payer: BC Managed Care – PPO

## 2019-05-04 ENCOUNTER — Ambulatory Visit: Payer: BC Managed Care – PPO | Admitting: Orthopaedic Surgery

## 2019-05-04 ENCOUNTER — Other Ambulatory Visit: Payer: Self-pay

## 2019-05-04 ENCOUNTER — Encounter: Payer: Self-pay | Admitting: Orthopaedic Surgery

## 2019-05-04 VITALS — BP 130/80 | HR 88 | Temp 97.4°F | Ht 60.0 in | Wt 214.0 lb

## 2019-05-04 DIAGNOSIS — M25562 Pain in left knee: Secondary | ICD-10-CM

## 2019-05-04 DIAGNOSIS — G8929 Other chronic pain: Secondary | ICD-10-CM | POA: Diagnosis not present

## 2019-05-04 MED ORDER — NAPROXEN 500 MG PO TABS: 500.0000 mg | ORAL_TABLET | Freq: Two times a day (BID) | ORAL | 5 refills | Status: DC

## 2019-05-04 NOTE — Progress Notes (Signed)
Subjective:    Patient ID: Jasmin Chen, female    DOB: 1980/09/07, 38 y.o.   MRN: [id number]  HPI She has been having left knee for the last three to four weeks. 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  She developed pain after unloading a truck.  Over time it has gotten worse. She has had some swelling. She has no redness. She has had a couple episodes of giving way. She has taken Tylenol and Advil with no help.  It is not getting better, getting worse.  She has pain more posteriorly.   Review of Systems  Constitutional: Positive for activity change.  Respiratory: Positive for shortness of breath.   Musculoskeletal: Positive for arthralgias, gait problem and joint swelling.  Neurological: Positive for headaches.  All other systems reviewed and are negative.  For Review of Systems, all other systems reviewed and are negative.  The following is a summary of the past history medically, past history surgically, known current medicines, social history and family history.  This information is gathered electronically by the computer from prior information and documentation.  I review this each visit and have found including this information at this point in the chart is beneficial and informative.   Past Medical History:  Diagnosis Date  . Asthma   . Chronic knee pain   . Chronic rhinitis   . Hidradenitis suppurativa   . Hypertension   . IBS (irritable bowel syndrome)   . Migraine without aura 2009  . Multiple environmental allergies   . Other and unspecified ovarian cyst 02/06/2013  . Ovarian cyst   . Papanicolaou smear of cervix with positive high risk human papilloma virus (HPV) test 07/20/2017  . Right leg weakness    chronic    Past Surgical History:  Procedure Laterality Date  . 2 cysts removed from arm    . ABDOMINAL HYSTERECTOMY    . BREAST REDUCTION SURGERY    . deviated septum repair    . hemroid surgery    . LAPAROSCOPIC GASTRIC SLEEVE RESECTION       Current Outpatient Medications on File Prior to Visit  Medication Sig Dispense Refill  . albuterol (PROVENTIL) (2.5 MG/3ML) 0.083% nebulizer solution Take 3 mLs (2.5 mg total) by nebulization every 4 (four) hours as needed for wheezing or shortness of breath. 75 mL 5  . albuterol (VENTOLIN HFA) 108 (90 Base) MCG/ACT inhaler Inhale 2 puffs into the lungs every 4 (four) hours as needed for wheezing or shortness of breath. 1 Inhaler 5  . ALPRAZolam (XANAX) 0.25 MG tablet Take 0.125-0.25 mg by mouth at bedtime as needed for anxiety.    . fluticasone (FLONASE) 50 MCG/ACT nasal spray Place 2 sprays into both nostrils daily. 16 g 0  . loratadine (CLARITIN) 10 MG tablet Take 1 tablet (10 mg total) by mouth daily. 30 tablet 0  . Multiple Vitamin (MULTIVITAMIN WITH MINERALS) TABS tablet Take 1 tablet by mouth daily.    Marland Kitchen omeprazole (PRILOSEC) 20 MG capsule Take 20 mg by mouth daily as needed (Acid Reflux).     No current facility-administered medications on file prior to visit.     Social History   Socioeconomic History  . Marital status: Single    Spouse name: Not on file  . Number of children: Not on file  . Years of education: Not on file  . Highest education level: Not on file  Occupational History  . Not on file  Social  Needs  . Financial resource strain: Not on file  . Food insecurity    Worry: Not on file    Inability: Not on file  . Transportation needs    Medical: Not on file    Non-medical: Not on file  Tobacco Use  . Smoking status: Never Smoker  . Smokeless tobacco: Never Used  Substance and Sexual Activity  . Alcohol use: No  . Drug use: No  . Sexual activity: Not Currently    Comment: hysterectomy  Lifestyle  . Physical activity    Days per week: Not on file    Minutes per session: Not on file  . Stress: Not on file  Relationships  . Social Herbalist on phone: Not on file    Gets together: Not on file    Attends religious service: Not on file     Active member of club or organization: Not on file    Attends meetings of clubs or organizations: Not on file    Relationship status: Not on file  . Intimate partner violence    Fear of current or ex partner: Not on file    Emotionally abused: Not on file    Physically abused: Not on file    Forced sexual activity: Not on file  Other Topics Concern  . Not on file  Social History Narrative  . Not on file    Family History  Problem Relation Age of Onset  . Hypertension Mother   . Diabetes Mother   . Hyperlipidemia Mother   . Hypertension Father   . Hyperlipidemia Father   . Heart attack Father   . Sarcoidosis Sister   . Thyroid disease Sister   . Hyperlipidemia Sister   . Hypertension Sister     BP 130/80   Pulse 88   Temp (!) 97.4 F (36.3 C)   Ht 5' (1.524 m)   Wt 214 lb (97.1 kg)   BMI 41.79 kg/m   Body mass index is 41.79 kg/m.      Objective:   Physical Exam Vitals signs reviewed.  Constitutional:      Appearance: She is well-developed.  HENT:     Head: Normocephalic and atraumatic.  Eyes:     Conjunctiva/sclera: Conjunctivae normal.     Pupils: Pupils are equal, round, and reactive to light.  Neck:     Musculoskeletal: Normal range of motion and neck supple.  Cardiovascular:     Rate and Rhythm: Normal rate and regular rhythm.  Pulmonary:     Effort: Pulmonary effort is normal.  Abdominal:     Palpations: Abdomen is soft.  Musculoskeletal:     Left knee: Tenderness found.       Legs:  Skin:    General: Skin is warm and dry.  Neurological:     Mental Status: She is alert and oriented to person, place, and time.     Cranial Nerves: No cranial nerve deficit.     Motor: No abnormal muscle tone.     Coordination: Coordination normal.     Deep Tendon Reflexes: Reflexes are normal and symmetric. Reflexes normal.  Psychiatric:        Behavior: Behavior normal.        Thought Content: Thought content normal.        Judgment: Judgment normal.        X-rays were done, reported separately.    Assessment & Plan:   Encounter Diagnosis  Name Primary?  . Chronic  pain of left knee Yes   PROCEDURE NOTE:  The patient requests injections of the left knee , verbal consent was obtained.  The left knee was prepped appropriately after time out was performed.   Sterile technique was observed and injection of 1 cc of Depo-Medrol 40 mg with several cc's of plain xylocaine. Anesthesia was provided by ethyl chloride and a 20-gauge needle was used to inject the knee area. The injection was tolerated well.  A band aid dressing was applied.  The patient was advised to apply ice later today and tomorrow to the injection sight as needed.  I will begin Naprosyn 500 po bid pc  Return in 12 days.  Call if any problem.  Precautions discussed.   Electronically Signed Sanjuana Kava, MD 11/19/20208:29 AM

## 2019-05-16 ENCOUNTER — Other Ambulatory Visit: Payer: Self-pay

## 2019-05-16 ENCOUNTER — Ambulatory Visit (INDEPENDENT_AMBULATORY_CARE_PROVIDER_SITE_OTHER): Payer: BC Managed Care – PPO | Admitting: Orthopaedic Surgery

## 2019-05-16 ENCOUNTER — Encounter: Payer: Self-pay | Admitting: Orthopaedic Surgery

## 2019-05-16 VITALS — BP 111/69 | HR 83 | Temp 97.5°F | Ht 60.0 in | Wt 209.0 lb

## 2019-05-16 DIAGNOSIS — M25562 Pain in left knee: Secondary | ICD-10-CM

## 2019-05-16 DIAGNOSIS — G8929 Other chronic pain: Secondary | ICD-10-CM | POA: Diagnosis not present

## 2019-05-16 NOTE — Patient Instructions (Signed)
After MRI is scheduled call us for appointment with Dr Jasmin Chen to review MRI scan

## 2019-05-16 NOTE — Progress Notes (Signed)
Patient DE:[id number] Jasmin Chen, female DOB:1980-08-06, 38 y.o. [id number]  Chief Complaint  Patient presents with  . Knee Pain    Chronic left knee pain.    HPI  Jasmin Chen is a 38 y.o. female who has continued pain of the left knee.  It has been hurting since October.  I saw her last month and gave her an injection.  It still hurts.  She has swelling and pain but no giving way.  She has more pain at the end of her work shift.  She has no new trauma.  I am concerned about a meniscus tear and will get MRI of the knee.   Body mass index is 40.82 kg/m.  ROS  Review of Systems  Constitutional: Positive for activity change.  Respiratory: Positive for shortness of breath.   Musculoskeletal: Positive for arthralgias, gait problem and joint swelling.  Neurological: Positive for headaches.  All other systems reviewed and are negative.   All other systems reviewed and are negative.  The following is a summary of the past history medically, past history surgically, known current medicines, social history and family history.  This information is gathered electronically by the computer from prior information and documentation.  I review this each visit and have found including this information at this point in the chart is beneficial and informative.    Past Medical History:  Diagnosis Date  . Asthma   . Chronic knee pain   . Chronic rhinitis   . Hidradenitis suppurativa   . Hypertension   . IBS (irritable bowel syndrome)   . Migraine without aura 2009  . Multiple environmental allergies   . Other and unspecified ovarian cyst 02/06/2013  . Ovarian cyst   . Papanicolaou smear of cervix with positive high risk human papilloma virus (HPV) test 07/20/2017  . Right leg weakness    chronic    Past Surgical History:  Procedure Laterality Date  . 2 cysts removed from arm    . ABDOMINAL HYSTERECTOMY    . BREAST REDUCTION SURGERY    . deviated septum repair    . hemroid surgery    .  LAPAROSCOPIC GASTRIC SLEEVE RESECTION      Family History  Problem Relation Age of Onset  . Hypertension Mother   . Diabetes Mother   . Hyperlipidemia Mother   . Hypertension Father   . Hyperlipidemia Father   . Heart attack Father   . Sarcoidosis Sister   . Thyroid disease Sister   . Hyperlipidemia Sister   . Hypertension Sister     Social History Social History   Tobacco Use  . Smoking status: Never Smoker  . Smokeless tobacco: Never Used  Substance Use Topics  . Alcohol use: No  . Drug use: No    Allergies  Allergen Reactions  . Prednisone Other (See Comments)    Difficulty concentrating and functioning    Current Outpatient Medications  Medication Sig Dispense Refill  . albuterol (PROVENTIL) (2.5 MG/3ML) 0.083% nebulizer solution Take 3 mLs (2.5 mg total) by nebulization every 4 (four) hours as needed for wheezing or shortness of breath. 75 mL 5  . albuterol (VENTOLIN HFA) 108 (90 Base) MCG/ACT inhaler Inhale 2 puffs into the lungs every 4 (four) hours as needed for wheezing or shortness of breath. 1 Inhaler 5  . ALPRAZolam (XANAX) 0.25 MG tablet Take 0.125-0.25 mg by mouth at bedtime as needed for anxiety.    . fluticasone (FLONASE) 50 MCG/ACT nasal spray Place 2 sprays  into both nostrils daily. 16 g 0  . loratadine (CLARITIN) 10 MG tablet Take 1 tablet (10 mg total) by mouth daily. 30 tablet 0  . Multiple Vitamin (MULTIVITAMIN WITH MINERALS) TABS tablet Take 1 tablet by mouth daily.    . naproxen (NAPROSYN) 500 MG tablet Take 1 tablet (500 mg total) by mouth 2 (two) times daily with a meal. 60 tablet 5  . omeprazole (PRILOSEC) 20 MG capsule Take 20 mg by mouth daily as needed (Acid Reflux).     No current facility-administered medications for this visit.      Physical Exam  Blood pressure 111/69, pulse 83, temperature (!) 97.5 F (36.4 C), height 5' (1.524 m), weight 209 lb (94.8 kg).  Constitutional: overall normal hygiene, normal nutrition, well  developed, normal grooming, normal body habitus. Assistive device:none  Musculoskeletal: gait and station Limp left, muscle tone and strength are normal, no tremors or atrophy is present.  .  Neurological: coordination overall normal.  Deep tendon reflex/nerve stretch intact.  Sensation normal.  Cranial nerves II-XII intact.   Skin:   Normal overall no scars, lesions, ulcers or rashes. No psoriasis.  Psychiatric: Alert and oriented x 3.  Recent memory intact, remote memory unclear.  Normal mood and affect. Well groomed.  Good eye contact.  Cardiovascular: overall no swelling, no varicosities, no edema bilaterally, normal temperatures of the legs and arms, no clubbing, cyanosis and good capillary refill.  Lymphatic: palpation is normal.  Left knee with effusion, pain, ROM 0 to 110, limp left, positive medial [place], [place] intact.  All other systems reviewed and are negative   The patient has been educated about the nature of the problem(s) and counseled on treatment options.  The patient appeared to understand what I have discussed and is in agreement with it.  Encounter Diagnosis  Name Primary?  . Chronic pain of left knee Yes    PLAN Call if any problems.  Precautions discussed.  Continue current medications.   Return to clinic after MRI of the left knee   Electronically Signed Sanjuana Kava, MD 12/1/20209:57 AM

## 2019-06-12 ENCOUNTER — Other Ambulatory Visit: Payer: Self-pay

## 2019-06-12 ENCOUNTER — Ambulatory Visit (INDEPENDENT_AMBULATORY_CARE_PROVIDER_SITE_OTHER): Payer: BC Managed Care – PPO | Admitting: Family Medicine

## 2019-06-12 ENCOUNTER — Encounter: Payer: Self-pay | Admitting: Family Medicine

## 2019-06-12 DIAGNOSIS — J310 Chronic rhinitis: Secondary | ICD-10-CM | POA: Diagnosis not present

## 2019-06-12 DIAGNOSIS — J329 Chronic sinusitis, unspecified: Secondary | ICD-10-CM | POA: Diagnosis not present

## 2019-06-12 MED ORDER — CEFPROZIL 500 MG PO TABS: ORAL_TABLET | ORAL | 0 refills | Status: DC

## 2019-06-12 NOTE — Progress Notes (Signed)
   Subjective:    Patient ID: Jasmin Chen, female    DOB: [birth year], 38 y.o.   MRN: [id number]  HPI Pt is having runny nose, sneezing, slight sore throat, loose stool right after she eats. Symptoms started on Saturday. Pt has tried Zyrtec, Flonase and Robitussin.   Virtual Visit via Telephone Note  I connected with Jasmin Chen on 06/12/19 at  2:30 PM EST by telephone and verified that I am speaking with the correct person using two identifiers.  Location: Patient: home Provider: office   I discussed the limitations, risks, security and privacy concerns of performing an evaluation and management service by telephone and the availability of in person appointments. I also discussed with the patient that there may be a patient responsible charge related to this service. The patient expressed understanding and agreed to proceed.   History of Present Illness:    Observations/Objective:   Assessment and Plan:   Follow Up Instructions:    I discussed the assessment and treatment plan with the patient. The patient was provided an opportunity to ask questions and all were answered. The patient agreed with the plan and demonstrated an understanding of the instructions.   The patient was advised to call back or seek an in-person evaluation if the symptoms worsen or if the condition fails to improve as anticipated.  I provided 68minutes of non-face-to-face time during this encounter.   Vicente Males, LPN Runny nose and sneezing   A little diarrrhea   Some sore throt   Drainage   No [place] sicknesss   [employer]   Cold air and iirrita   Review of Systems See above    Objective:   Physical Exam   Virtual     Assessment & Plan:  Impression rhinosinusitis.  Potential for COVID-19 discussed.  Testing strongly encouraged.  Antibiotics prescribed.  Warning signs discussed

## 2019-06-13 ENCOUNTER — Ambulatory Visit: Payer: BC Managed Care – PPO | Attending: Internal Medicine

## 2019-06-13 DIAGNOSIS — Z20822 Contact with and (suspected) exposure to covid-19: Secondary | ICD-10-CM

## 2019-06-13 DIAGNOSIS — Z20828 Contact with and (suspected) exposure to other viral communicable diseases: Secondary | ICD-10-CM | POA: Diagnosis not present

## 2019-06-14 LAB — NOVEL CORONAVIRUS, NAA: SARS-CoV-2, NAA: NOT DETECTED

## 2019-06-15 ENCOUNTER — Telehealth: Payer: Self-pay | Admitting: Family Medicine

## 2019-06-15 MED ORDER — CEFDINIR 300 MG PO CAPS: ORAL_CAPSULE | ORAL | 0 refills | Status: DC

## 2019-06-15 NOTE — Telephone Encounter (Signed)
Omnicef 300 bid ten d

## 2019-06-15 NOTE — Telephone Encounter (Signed)
Medication sent to pharmacy. Left message to return call 

## 2019-06-15 NOTE — Telephone Encounter (Signed)
Pt had virtual Monday 12/28 with Dr. Richardson Landry. The pharmacy seems to be having trouble getting cefPROZIL (CEFZIL) 500 MG tablet in. She would like something else called into [id number] - [place],  - [address].

## 2019-06-15 NOTE — Telephone Encounter (Signed)
Pt returned call and verbalized understanding  

## 2019-06-23 ENCOUNTER — Other Ambulatory Visit: Payer: Self-pay

## 2019-06-23 ENCOUNTER — Ambulatory Visit (HOSPITAL_COMMUNITY)
Admission: RE | Admit: 2019-06-23 | Discharge: 2019-06-23 | Disposition: A | Discharge status: Home / Self Care | Payer: BC Managed Care – PPO | Source: Ambulatory Visit | Attending: Orthopaedic Surgery | Admitting: Orthopaedic Surgery

## 2019-06-23 DIAGNOSIS — G8929 Other chronic pain: Secondary | ICD-10-CM | POA: Diagnosis not present

## 2019-06-23 DIAGNOSIS — M25562 Pain in left knee: Secondary | ICD-10-CM | POA: Diagnosis not present

## 2019-09-14 ENCOUNTER — Emergency Department (HOSPITAL_COMMUNITY): Payer: BC Managed Care – PPO

## 2019-09-14 ENCOUNTER — Encounter (HOSPITAL_COMMUNITY): Payer: Self-pay | Admitting: Emergency Medicine

## 2019-09-14 ENCOUNTER — Other Ambulatory Visit: Payer: Self-pay

## 2019-09-14 ENCOUNTER — Emergency Department (HOSPITAL_COMMUNITY)
Admission: EM | Admit: 2019-09-14 | Discharge: 2019-09-14 | Disposition: A | Discharge status: Home / Self Care | Payer: BC Managed Care – PPO | Attending: Emergency Medicine | Admitting: Emergency Medicine

## 2019-09-14 DIAGNOSIS — I10 Essential (primary) hypertension: Secondary | ICD-10-CM | POA: Insufficient documentation

## 2019-09-14 DIAGNOSIS — R109 Unspecified abdominal pain: Secondary | ICD-10-CM | POA: Diagnosis not present

## 2019-09-14 DIAGNOSIS — Z79899 Other long term (current) drug therapy: Secondary | ICD-10-CM | POA: Diagnosis not present

## 2019-09-14 DIAGNOSIS — K625 Hemorrhage of anus and rectum: Secondary | ICD-10-CM | POA: Diagnosis not present

## 2019-09-14 DIAGNOSIS — K529 Noninfective gastroenteritis and colitis, unspecified: Secondary | ICD-10-CM | POA: Diagnosis not present

## 2019-09-14 DIAGNOSIS — J45909 Unspecified asthma, uncomplicated: Secondary | ICD-10-CM | POA: Insufficient documentation

## 2019-09-14 LAB — CBC WITH DIFFERENTIAL/PLATELET
Abs Immature Granulocytes: 0.01 10*3/uL (ref 0.00–0.07)
Basophils Absolute: 0 10*3/uL (ref 0.0–0.1)
Basophils Relative: 1 %
Eosinophils Absolute: 0.2 10*3/uL (ref 0.0–0.5)
Eosinophils Relative: 4 %
HCT: 40.5 % (ref 36.0–46.0)
Hemoglobin: 12.9 g/dL (ref 12.0–15.0)
Immature Granulocytes: 0 %
Lymphocytes Relative: 42 %
Lymphs Abs: 2.2 10*3/uL (ref 0.7–4.0)
MCH: 27.9 pg (ref 26.0–34.0)
MCHC: 31.9 g/dL (ref 30.0–36.0)
MCV: 87.7 fL (ref 80.0–100.0)
Monocytes Absolute: 0.3 10*3/uL (ref 0.1–1.0)
Monocytes Relative: 6 %
Neutro Abs: 2.5 10*3/uL (ref 1.7–7.7)
Neutrophils Relative %: 47 %
Platelets: 243 10*3/uL (ref 150–400)
RBC: 4.62 MIL/uL (ref 3.87–5.11)
RDW: 14 % (ref 11.5–15.5)
WBC: 5.2 10*3/uL (ref 4.0–10.5)
nRBC: 0 % (ref 0.0–0.2)

## 2019-09-14 LAB — COMPREHENSIVE METABOLIC PANEL
ALT: 11 U/L (ref 0–44)
AST: 22 U/L (ref 15–41)
Albumin: 3.9 g/dL (ref 3.5–5.0)
Alkaline Phosphatase: 49 U/L (ref 38–126)
Anion gap: 9 (ref 5–15)
BUN: 10 mg/dL (ref 6–20)
CO2: 25 mmol/L (ref 22–32)
Calcium: 8.7 mg/dL — ABNORMAL LOW (ref 8.9–10.3)
Chloride: 104 mmol/L (ref 98–111)
Creatinine, Ser: 0.6 mg/dL (ref 0.44–1.00)
GFR calc Af Amer: >60 mL/min (ref >60)
GFR calc non Af Amer: >60 mL/min (ref >60)
Glucose, Bld: 98 mg/dL (ref 70–99)
Potassium: 4.6 mmol/L (ref 3.5–5.1)
Sodium: 138 mmol/L (ref 135–145)
Total Bilirubin: 1.3 mg/dL — ABNORMAL HIGH (ref 0.3–1.2)
Total Protein: 6.9 g/dL (ref 6.5–8.1)

## 2019-09-14 LAB — I-STAT BETA HCG BLOOD, ED (MC, WL, AP ONLY): I-stat hCG, quantitative: <5 m[IU]/mL (ref <5)

## 2019-09-14 LAB — URINALYSIS, ROUTINE W REFLEX MICROSCOPIC
Bilirubin Urine: NEGATIVE
Glucose, UA: NEGATIVE mg/dL
Hgb urine dipstick: NEGATIVE
Ketones, ur: NEGATIVE mg/dL
Leukocytes,Ua: NEGATIVE
Nitrite: NEGATIVE
Protein, ur: NEGATIVE mg/dL
Specific Gravity, Urine: 1.017 (ref 1.005–1.030)
pH: 7 (ref 5.0–8.0)

## 2019-09-14 LAB — LIPASE, BLOOD: Lipase: 19 U/L (ref 11–51)

## 2019-09-14 LAB — POC OCCULT BLOOD, ED: Fecal Occult Bld: POSITIVE — AB

## 2019-09-14 MED ORDER — MORPHINE SULFATE (PF) 4 MG/ML IV SOLN
4.0000 mg | Freq: Once | INTRAVENOUS | Status: AC
  Administered 2019-09-14: 11:00:00 4 mg via INTRAVENOUS
  Filled 2019-09-14: qty 1

## 2019-09-14 MED ORDER — IOHEXOL 300 MG/ML  SOLN
100.0000 mL | Freq: Once | INTRAMUSCULAR | Status: AC | PRN
  Administered 2019-09-14: 13:00:00 100 mL via INTRAVENOUS

## 2019-09-14 MED ORDER — METOCLOPRAMIDE HCL 5 MG/ML IJ SOLN
10.0000 mg | Freq: Once | INTRAMUSCULAR | Status: AC
  Administered 2019-09-14: 13:00:00 10 mg via INTRAVENOUS
  Filled 2019-09-14: qty 2

## 2019-09-14 MED ORDER — ONDANSETRON HCL 4 MG/2ML IJ SOLN
4.0000 mg | Freq: Once | INTRAMUSCULAR | Status: AC
  Administered 2019-09-14: 11:00:00 4 mg via INTRAVENOUS
  Filled 2019-09-14: qty 2

## 2019-09-14 MED ORDER — DIPHENHYDRAMINE HCL 50 MG/ML IJ SOLN
25.0000 mg | Freq: Once | INTRAMUSCULAR | Status: AC
  Administered 2019-09-14: 25 mg via INTRAVENOUS
  Filled 2019-09-14: qty 1

## 2019-09-14 MED ORDER — ONDANSETRON 4 MG PO TBDP: 4.0000 mg | ORAL_TABLET | Freq: Three times a day (TID) | ORAL | 0 refills | Status: DC | PRN

## 2019-09-14 MED ORDER — SODIUM CHLORIDE 0.9 % IV BOLUS
1000.0000 mL | Freq: Once | INTRAVENOUS | Status: AC
  Administered 2019-09-14: 11:00:00 1000 mL via INTRAVENOUS

## 2019-09-14 MED ORDER — AMOXICILLIN-POT CLAVULANATE 875-125 MG PO TABS: 1.0000 | ORAL_TABLET | Freq: Two times a day (BID) | ORAL | 0 refills | Status: DC

## 2019-09-14 NOTE — ED Notes (Signed)
Taken to CT.

## 2019-09-14 NOTE — Discharge Instructions (Signed)
2-day bland diet and lots of clear liquids.  Take the antibiotics as prescribed.  I have given you Zofran for nausea.  Follow-up with your primary care provider for symptoms are unresolved over the next few days.  He developed severe lightheadedness, dizziness please seek reevaluation the emergency department

## 2019-09-14 NOTE — ED Provider Notes (Signed)
[name] Provider Note   CSN: [id number] Arrival date & time: 09/14/19  1010     History Chief Complaint  Patient presents with  . Rectal Bleeding    Jasmin Chen is a 39 y.o. female with past medical history significant for chronic knee pain, asthma, IBS, hypertension, ovarian cyst, hemorrhoids who presents for evaluation of rectal bleeding.  Patient states she was at work earlier today when she had some left upper and lower quadrant cramping.  Patient states she had a bowel movement and when she wiped she noticed bright red blood on the toilet tissue.  Patient states she looked down the toilet bowl and noticed there was blood in the toilet as well.  Does have history of rectal bleeding approximately 8 years ago when she had hemorrhoids however patient states she had surgical intervention has not had difficulty since then.  She rates her abdominal pain an 8/10.  She has history of partial hysterectomy still has her ovaries.  Patient states her pain now is more located to her left upper quadrant.  She admits to some mild nausea without emesis.  No fever, chills, chest pain, shortness of breath, pelvic pain, vaginal discharge, vaginal bleeding, dysuria, constipation, weakness, lightheadedness or syncope.  Denies aggravating or alleviating factors.  She only had one episode of bright red blood in the toilet.  Normal bowel movement yesterday.  Family called PCP and they were not able to get her into the suggested she come to the emergency department for evaluation    History obtained from patient and past medical record.  No interpreter is used.  Not followed by Gi. Unsure if she has ever had a colonoscopy. Hx of IBS.  HPI     Past Medical History:  Diagnosis Date  . Asthma   . Chronic knee pain   . Chronic rhinitis   . Hidradenitis suppurativa   . Hypertension   . IBS (irritable bowel syndrome)   . Migraine without aura 2009  . Multiple  environmental allergies   . Other and unspecified ovarian cyst 02/06/2013  . Ovarian cyst   . Papanicolaou smear of cervix with positive high risk human papilloma virus (HPV) test 07/20/2017  . Right leg weakness    chronic    Patient Active Problem List   Diagnosis Date Noted  . Papanicolaou smear of cervix with positive high risk human papilloma virus (HPV) test 07/20/2017  . Rapid palpitations 04/26/2017  . History of syncope 04/26/2017  . 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 Left ovarian cyst 03/11/2014  . Ovarian cyst 03/11/2014  . Knee pain 10/10/2013  . Stiffness of joint, not elsewhere classified, lower leg 10/10/2013  . Weakness of right leg 10/10/2013  . Other and unspecified ovarian cyst 03/20/2013  . Other and unspecified ovarian cyst 02/06/2013  . Other and unspecified ovarian cyst 02/06/2013  . IBS (irritable bowel syndrome) 02/03/2013  . Acid reflux 09/28/2012  . HLD (hyperlipidemia) 09/28/2012  . Benign hypertension 09/28/2012  . Hidradenitis suppurativa 09/19/2012  . Morbid obesity ([place]) 09/19/2012  . RECTAL BLEEDING 08/20/2010  . ABDOMINAL PAIN-LLQ 08/20/2010    Past Surgical History:  Procedure Laterality Date  . 2 cysts removed from arm    . ABDOMINAL HYSTERECTOMY    . BREAST REDUCTION SURGERY    . deviated septum repair    . hemroid surgery    . LAPAROSCOPIC GASTRIC SLEEVE RESECTION       OB History    Gravida  0  Para  0   Term  0   Preterm  0   AB  0   Living  0     SAB  0   TAB  0   Ectopic  0   Multiple  0   Live Births  0           Family History  Problem Relation Age of Onset  . Hypertension Mother   . Diabetes Mother   . Hyperlipidemia Mother   . Hypertension Father   . Hyperlipidemia Father   . Heart attack Father   . Sarcoidosis Sister   . Thyroid disease Sister   . Hyperlipidemia Sister   . Hypertension Sister     Social History   Tobacco Use  . Smoking status: Never Smoker  . Smokeless tobacco: Never Used    Substance Use Topics  . Alcohol use: No  . Drug use: No    Home Medications Prior to Admission medications   Medication Sig Start Date End Date Taking? Authorizing Provider  albuterol (PROVENTIL) (2.5 MG/3ML) 0.083% nebulizer solution Take 3 mLs (2.5 mg total) by nebulization every 4 (four) hours as needed for wheezing or shortness of breath. 10/03/18  Yes Luking, Elayne Snare, MD  albuterol (VENTOLIN HFA) 108 (90 Base) MCG/ACT inhaler Inhale 2 puffs into the lungs every 4 (four) hours as needed for wheezing or shortness of breath. 10/03/18  Yes Luking, Elayne Snare, MD  fluticasone (FLONASE) 50 MCG/ACT nasal spray Place 2 sprays into both nostrils daily. 06/26/17  Yes Waynetta Pean, PA-C  loratadine (CLARITIN) 10 MG tablet Take 1 tablet (10 mg total) by mouth daily. 06/26/17  Yes Waynetta Pean, PA-C  Multiple Vitamin (MULTIVITAMIN WITH MINERALS) TABS tablet Take 1 tablet by mouth daily.   Yes [provider]  naproxen (NAPROSYN) 500 MG tablet Take 1 tablet (500 mg total) by mouth 2 (two) times daily with a meal. 05/04/19  Yes Sanjuana Kava, MD  omeprazole (PRILOSEC) 20 MG capsule Take 20 mg by mouth daily as needed (Acid Reflux).   Yes [provider]  amoxicillin-clavulanate (AUGMENTIN) 875-125 MG tablet Take 1 tablet by mouth every 12 (twelve) hours. 09/14/19   Garhett Bernhard A, PA-C  ondansetron (ZOFRAN ODT) 4 MG disintegrating tablet Take 1 tablet (4 mg total) by mouth every 8 (eight) hours as needed for nausea or vomiting. 09/14/19   Anuj Summons A, PA-C    Allergies    Prednisone  Review of Systems   Review of Systems  Constitutional: Negative.   HENT: Negative.   Respiratory: Negative.   Cardiovascular: Negative.   Gastrointestinal: Positive for abdominal pain, blood in stool and nausea. Negative for anal bleeding, constipation, diarrhea, rectal pain and vomiting.  Genitourinary: Negative.   Musculoskeletal: Negative.   Skin: Negative.   Neurological: Negative.    All other systems reviewed and are negative.   Physical Exam Updated Vital Signs BP 115/70   Pulse (!) 51   Temp 98.3 F (36.8 C) (Oral)   Resp 14   Ht 5' (1.524 m)   Wt 97.5 kg   SpO2 99%   BMI 41.99 kg/m   Physical Exam Vitals and nursing note reviewed.  Constitutional:      General: She is not in acute distress.    Appearance: She is well-developed. She is not ill-appearing, toxic-appearing or diaphoretic.  HENT:     Head: Normocephalic and atraumatic.     Nose: Nose normal.     Mouth/Throat:  Mouth: Mucous membranes are moist.     Pharynx: Oropharynx is clear.  Eyes:     Pupils: Pupils are equal, round, and reactive to light.  Cardiovascular:     Rate and Rhythm: Normal rate.     Pulses: Normal pulses.     Heart sounds: Normal heart sounds.  Pulmonary:     Effort: Pulmonary effort is normal. No respiratory distress.     Breath sounds: Normal breath sounds.  Abdominal:     General: Bowel sounds are normal. There is no distension.     Palpations: Abdomen is soft.     Tenderness: There is abdominal tenderness in the left upper quadrant and left lower quadrant. There is no right CVA tenderness, left CVA tenderness, guarding or rebound. Negative signs include Murphy's sign, Rovsing's sign, McBurney's sign, psoas sign and obturator sign.     Hernia: No hernia is present.       Comments: Minimal tenderness to her left upper quadrant and left lower quadrant.  No rebound or guarding.  Genitourinary:    Rectum: Guaiac result positive. Internal hemorrhoid present. No mass, tenderness or anal fissure. Normal anal tone.     Comments: Nurse in room during exam.  Light brown stool in vault however occult positive.  No tenderness.  Small internal hemorrhoid.  Old hemorrhoid skin tags Musculoskeletal:        General: Normal range of motion.     Cervical back: Normal range of motion.     Comments: Moves all 4 extremities without difficulty  Skin:    General: Skin is warm  and dry.     Capillary Refill: Capillary refill takes less than 2 seconds.     Comments: Brisk cap refill  Neurological:     General: No focal deficit present.     Mental Status: She is alert.     Comments: Cranial nerves II through XII grossly intact.  Negative finger-nose, Romberg     ED Results / Procedures / Treatments   Labs (all labs ordered are listed, but only abnormal results are displayed) Labs Reviewed  COMPREHENSIVE METABOLIC PANEL - Abnormal; Notable for the following components:      Result Value   Calcium 8.7 (*)    Total Bilirubin 1.3 (*)    All other components within normal limits  URINALYSIS, ROUTINE W REFLEX MICROSCOPIC - Abnormal; Notable for the following components:   APPearance HAZY (*)    All other components within normal limits  POC OCCULT BLOOD, ED - Abnormal; Notable for the following components:   Fecal Occult Bld POSITIVE (*)    All other components within normal limits  CBC WITH DIFFERENTIAL/PLATELET  LIPASE, BLOOD  I-STAT BETA HCG BLOOD, ED ([facility], [facility], [facility] ONLY)    EKG None  Radiology CT Abdomen Pelvis W Contrast  Result Date: 09/14/2019 CLINICAL DATA:  Left lower quadrant abdominal pain and nausea for 2 days. EXAM: CT ABDOMEN AND PELVIS WITH CONTRAST TECHNIQUE: Multidetector CT imaging of the abdomen and pelvis was performed using the standard protocol following bolus administration of intravenous contrast. CONTRAST:  186mL OMNIPAQUE IOHEXOL 300 MG/ML  SOLN COMPARISON:  11/27/2014 FINDINGS: Lower chest: Unremarkable Hepatobiliary: Unremarkable Pancreas: Unremarkable Spleen: Unremarkable Adrenals/Urinary Tract: Unremarkable Stomach/Bowel: Postoperative findings in the stomach compatible with sleeve gastrectomy. Nondistended descending and proximal sigmoid colon with associated mild wall thickening which could reflect low-grade colitis. Normal appendix. Vascular/Lymphatic: Unremarkable Reproductive: Prominent right ovary 4.8 by 3.5 by 4.5 cm (volume = 40  cm^3), previously 32 cubic cm.  Mildly prominent left ovary, 3.6 by 3.3 by 3.5 cm (volume = 22 cm^3), previously 22 cubic cm. By report the patient has had hysterectomy but there is fullness along the vaginal cuff region, which might be normal if this was a partial hysterectomy, and which is unchanged from 11/26/2014. Other: No supplemental non-categorized findings. Musculoskeletal: Unremarkable IMPRESSION: 1. Nondistended descending and proximal sigmoid colon with associated mild wall thickening which could reflect low-grade colitis. 2. Prominent bilateral ovaries, right greater than left. This has been present in the past although the right ovary has moderately further enlarged compared to 11/26/2014. This can be present in several conditions such as polycystic ovary disease. Correlate with patient history in determining whether sonographic investigation is warranted. 3. There is chronically stable soft tissue density along the vaginal cuff in this patient with history of hysterectomy. This may well be normal if the patient had a partial hysterectomy with retention of the cervix, correlate with operative history. This could also be further assessed with transvaginal pelvic sonography. 4. Postoperative findings in the stomach compatible with sleeve gastrectomy. Electronically Signed   By: Van Clines M.D.   On: 09/14/2019 13:14    Procedures Procedures (including critical care time)  Medications Ordered in ED Medications  sodium chloride 0.9 % bolus 1,000 mL (1,000 mLs Intravenous New Bag/Given 09/14/19 1117)  ondansetron (ZOFRAN) injection 4 mg (4 mg Intravenous Given 09/14/19 1118)  morphine 4 MG/ML injection 4 mg (4 mg Intravenous Given 09/14/19 1118)  iohexol (OMNIPAQUE) 300 MG/ML solution 100 mL (100 mLs Intravenous Contrast Given 09/14/19 1243)  metoCLOPramide (REGLAN) injection 10 mg (10 mg Intravenous Given 09/14/19 1316)  diphenhydrAMINE (BENADRYL) injection 25 mg (25 mg Intravenous Given 09/14/19  1316)    ED Course  I have reviewed the triage vital signs and the nursing notes.  Pertinent labs & imaging results that were available during my care of the patient were reviewed by me and considered in my medical decision making (see chart for details).  39 year old female patient otherwise well presents for evaluation of one episode of bright red blood in her stool earlier today.  Has had some left upper and lower quadrant abdominal pain.  Tolerating p.o. intake.  No lightheaded or dizziness.  Plan for labs, occult, imaging and reassess  Labs and imaging personally reviewed and interpreted CBC without leukocytosis, Hgb 12.9, at baseline Metabolic panel with mild elevation in bilirubin at 1.3 however normal other LFTs.  She has negative Murphy sign.  Low suspicion for choledocholithiasis, cholecystitis Lipase 19 Urinalysis negative POC positive blood however patient light brown stool on GU exam CTAP with colitis. Also with enlarged ovaries R>L.  Is nontender to her right lower quadrant or bil pelvic region.  Discussed with patient.  She will follow-up with OB/GYN.  Have low suspicion for torsion, PID, UA.  1230: Abd pain improved whoever with HA. Non focal Neuro exam. Will give HA cocktail  1355: Patient reassessed.  Tolerating p.o. intake.  Repeat abdominal exam benign.  Pain improved here in emergency department.  Discussed findings of CT with colitis.  Will DC with antibiotics given rectal bleeding.  Her hemoglobin is stable, she appears overall well without lightheadedness or dizziness.  We will DC home with strict return precautions return to emergency department she will follow-up with her PCP outpatient.  Patient is nontoxic, nonseptic appearing, in no apparent distress.  Patient's pain and other symptoms adequately managed in emergency department.  Fluid bolus given.  Labs, imaging and vitals reviewed.  Patient does  not meet the SIRS or Sepsis criteria.  On repeat exam patient does  not have a surgical abdomin and there are no peritoneal signs.  No indication of appendicitis, bowel obstruction, bowel perforation, cholecystitis, diverticulitis, PID or ectopic pregnancy.  Patient discharged home with symptomatic treatment and given strict instructions for follow-up with their primary care physician.  I have also discussed reasons to return immediately to the ER.  Patient expresses understanding and agrees with plan.     MDM Rules/Calculators/A&P                       Final Clinical Impression(s) / ED Diagnoses Final diagnoses:  Colitis    Rx / DC Orders ED Discharge Orders         Ordered    amoxicillin-clavulanate (AUGMENTIN) 875-125 MG tablet  Every 12 hours     09/14/19 1358    ondansetron (ZOFRAN ODT) 4 MG disintegrating tablet  Every 8 hours PRN     09/14/19 1358           Joretta Eads A, PA-C 09/14/19 1359    Charlesetta Shanks, MD 09/15/19 [phone]

## 2019-09-14 NOTE — ED Triage Notes (Addendum)
Pt reports while at work just prior to arrival she had a bowel movement and afterwards noticed a "toliet full of blood". Pt states her stool appeared dark and had bright red blood as well. Pt has LLQ pain. Pt states this pain began about 2 days ago has been mild. Hx of IBS, LMP 10 years ago had hysterectomy. Pt mentioned she isn't sure if this was rectal or vaginal bleeding.

## 2019-09-18 ENCOUNTER — Ambulatory Visit: Payer: BC Managed Care – PPO | Admitting: Family Medicine

## 2019-09-18 ENCOUNTER — Other Ambulatory Visit: Payer: Self-pay

## 2019-09-18 ENCOUNTER — Encounter: Payer: Self-pay | Admitting: Family Medicine

## 2019-09-18 VITALS — BP 114/68 | Temp 97.9°F | Wt 213.2 lb

## 2019-09-18 DIAGNOSIS — F439 Reaction to severe stress, unspecified: Secondary | ICD-10-CM | POA: Diagnosis not present

## 2019-09-18 DIAGNOSIS — K529 Noninfective gastroenteritis and colitis, unspecified: Secondary | ICD-10-CM

## 2019-09-18 MED ORDER — DICYCLOMINE HCL 10 MG PO CAPS: 10.0000 mg | ORAL_CAPSULE | Freq: Three times a day (TID) | ORAL | 3 refills | Status: DC

## 2019-09-18 MED ORDER — ALPRAZOLAM 0.25 MG PO TABS: ORAL_TABLET | ORAL | 0 refills | Status: DC

## 2019-09-18 NOTE — Progress Notes (Signed)
   Subjective:    Patient ID: Jasmin Chen, female    DOB: Dec 18, 1980, 39 y.o.   MRN: [id number]  HPI Pt here today for ER follow up. Pt went to ER on 09/14/19 due to having blood in toilet after using bathroom. Pt was diagnosed with colitis. Pt has not seen any blood at this time. At hospital, the stool still had blood in it.  Pt is still having pain today in lower left abdomen. Pt has some diarrhea at times. Pt states hospital said her BP was low. Pt has been having some headache.  Started around April 1 Blood in bm and pain Went to er Slept thur night Friday had pain Went to work on Friday Sat severe pain Sun tired some pain fatigue Mild headaches also Drinking water and some grape juice Ate some food but not much feeling full PMH has never had a history of this Pt would like a new script for Xanax.  Review of Systems  Constitutional: Negative for activity change, appetite change and fatigue.  HENT: Negative for congestion and rhinorrhea.   Respiratory: Negative for cough and shortness of breath.   Cardiovascular: Negative for chest pain and leg swelling.  Gastrointestinal: Positive for abdominal pain and nausea. Negative for diarrhea.  Endocrine: Negative for polydipsia and polyphagia.  Skin: Negative for color change.  Neurological: Negative for dizziness and weakness.  Psychiatric/Behavioral: Negative for behavioral problems and confusion.       Objective:   Physical Exam Vitals reviewed.  Constitutional:      General: She is not in acute distress. HENT:     Head: Normocephalic and atraumatic.  Eyes:     General:        Right eye: No discharge.        Left eye: No discharge.  Neck:     Trachea: No tracheal deviation.  Cardiovascular:     Rate and Rhythm: Normal rate and regular rhythm.     Heart sounds: Normal heart sounds. No murmur.  Pulmonary:     Effort: Pulmonary effort is normal. No respiratory distress.     Breath sounds: Normal breath sounds.    Lymphadenopathy:     Cervical: No cervical adenopathy.  Skin:    General: Skin is warm and dry.  Neurological:     Mental Status: She is alert.     Coordination: Coordination normal.  Psychiatric:        Behavior: Behavior normal.     Patient has tenderness in the left lower quadrant and in the mid pelvic region.      Assessment & Plan:  Colitis No longer having bleeding Still having soreness of pain Labs indicated to look at this C-reactive protein and recheck CBC about 7 Patient will also need gastroenterology consultation More than likely will need a follow-up colonoscopy with biopsies to look for ulcerative colitis Referral to gastroenterology recommended Follow-up if progressive troubles  May use Xanax as needed

## 2019-09-19 LAB — BASIC METABOLIC PANEL
BUN/Creatinine Ratio: 12 (ref 9–23)
BUN: 7 mg/dL (ref 6–20)
CO2: 23 mmol/L (ref 20–29)
Calcium: 9.7 mg/dL (ref 8.7–10.2)
Chloride: 101 mmol/L (ref 96–106)
Creatinine, Ser: 0.58 mg/dL (ref 0.57–1.00)
GFR calc Af Amer: 134 mL/min/{1.73_m2} (ref >59)
GFR calc non Af Amer: 116 mL/min/{1.73_m2} (ref >59)
Glucose: 91 mg/dL (ref 65–99)
Potassium: 4.5 mmol/L (ref 3.5–5.2)
Sodium: 139 mmol/L (ref 134–144)

## 2019-09-19 LAB — CBC WITH DIFFERENTIAL/PLATELET
Basophils Absolute: 0.1 10*3/uL (ref 0.0–0.2)
Basos: 1 %
EOS (ABSOLUTE): 0.1 10*3/uL (ref 0.0–0.4)
Eos: 2 %
Hematocrit: 42.9 % (ref 34.0–46.6)
Hemoglobin: 14 g/dL (ref 11.1–15.9)
Immature Grans (Abs): 0 10*3/uL (ref 0.0–0.1)
Immature Granulocytes: 0 %
Lymphocytes Absolute: 2.6 10*3/uL (ref 0.7–3.1)
Lymphs: 42 %
MCH: 27.9 pg (ref 26.6–33.0)
MCHC: 32.6 g/dL (ref 31.5–35.7)
MCV: 86 fL (ref 79–97)
Monocytes Absolute: 0.4 10*3/uL (ref 0.1–0.9)
Monocytes: 7 %
Neutrophils Absolute: 2.9 10*3/uL (ref 1.4–7.0)
Neutrophils: 48 %
Platelets: 259 10*3/uL (ref 150–450)
RBC: 5.02 x10E6/uL (ref 3.77–5.28)
RDW: 14.6 % (ref 11.7–15.4)
WBC: 6.2 10*3/uL (ref 3.4–10.8)

## 2019-09-19 LAB — C-REACTIVE PROTEIN: CRP: 6 mg/L (ref 0–10)

## 2019-09-22 ENCOUNTER — Ambulatory Visit (INDEPENDENT_AMBULATORY_CARE_PROVIDER_SITE_OTHER): Payer: BC Managed Care – PPO | Admitting: Adult Health

## 2019-09-22 ENCOUNTER — Encounter: Payer: Self-pay | Admitting: Adult Health

## 2019-09-22 ENCOUNTER — Other Ambulatory Visit: Payer: Self-pay

## 2019-09-22 VITALS — BP 130/85 | HR 89 | Ht 60.0 in | Wt 212.0 lb

## 2019-09-22 DIAGNOSIS — N838 Other noninflammatory disorders of ovary, fallopian tube and broad ligament: Secondary | ICD-10-CM | POA: Diagnosis not present

## 2019-09-22 NOTE — Progress Notes (Signed)
  Subjective:     Patient ID: Jasmin Chen, female   DOB: July 12, 1980, 39 y.o.   MRN: [id number]  HPI Kiernan is a 39 year old black female,single, sp [facility] in today for follow up on CT 09/14/19 that showed enlarged ovaries. She was seen in ER at [facility]  for having BM at work with rectal bleeding and  Was treated for colitis. She saw Dr Wolfgang Phoenix Monday and he wanted her to follow up with here. PCP is [employer].   Review of Systems  Had rectal bleeding  Reviewed past medical,surgical, social and family history. Reviewed medications and allergies.     Objective:   Physical Exam BP 130/85 (BP Location: Left Arm, Patient Position: Sitting, Cuff Size: Normal)   Pulse 89   Ht 5' (1.524 m)   Wt 212 lb (96.2 kg)   BMI 41.40 kg/m  Skin warm and dry.Pelvic: external genitalia is normal in appearance no lesions, vagina: pink with good moisture, has creamy discharge, no odor,urethra has no lesions or masses noted, cervix:smooth and bulbous, uterus: is absent, adnexa: no masses, LLQ tenderness noted. Bladder is non tender and no masses felt.  Examination chaperoned by Celene Squibb LPN    Assessment:      1. Enlarged ovary, R>L, found on CT Return in 10 days for GYN [place] then see me 2-3 days later for pap and physical     Plan:     Will get GYN [place] in about 10 days

## 2019-09-25 ENCOUNTER — Encounter: Payer: Self-pay | Admitting: Family Medicine

## 2019-09-26 ENCOUNTER — Encounter: Payer: Self-pay | Admitting: Gastroenterology

## 2019-09-29 ENCOUNTER — Other Ambulatory Visit: Payer: Self-pay | Admitting: Adult Health

## 2019-09-29 DIAGNOSIS — R1032 Left lower quadrant pain: Secondary | ICD-10-CM

## 2019-10-02 ENCOUNTER — Ambulatory Visit (INDEPENDENT_AMBULATORY_CARE_PROVIDER_SITE_OTHER): Payer: BC Managed Care – PPO

## 2019-10-02 ENCOUNTER — Other Ambulatory Visit: Payer: Self-pay

## 2019-10-02 DIAGNOSIS — R1032 Left lower quadrant pain: Secondary | ICD-10-CM

## 2019-10-02 DIAGNOSIS — N8312 Corpus luteum cyst of left ovary: Secondary | ICD-10-CM | POA: Diagnosis not present

## 2019-10-02 DIAGNOSIS — N838 Other noninflammatory disorders of ovary, fallopian tube and broad ligament: Secondary | ICD-10-CM | POA: Diagnosis not present

## 2019-10-02 NOTE — Progress Notes (Signed)
PELVIC US TA/TV: normal cervical stump,enlarged right ovary,hemorrhagic left ovarian cyst 4.2 x 3.4 x 3.5 cm,left adnexal pain during ultrasound,ovaries do not slide with probe pressure,no free fluid

## 2019-10-05 ENCOUNTER — Other Ambulatory Visit: Payer: BC Managed Care – PPO | Admitting: Adult Health

## 2019-10-10 ENCOUNTER — Encounter: Payer: Self-pay | Admitting: Adult Health

## 2019-10-10 ENCOUNTER — Other Ambulatory Visit (HOSPITAL_COMMUNITY)
Admission: RE | Admit: 2019-10-10 | Discharge: 2019-10-10 | Disposition: A | Discharge status: Home / Self Care | Payer: BC Managed Care – PPO | Source: Ambulatory Visit | Attending: Adult Health | Admitting: Adult Health

## 2019-10-10 ENCOUNTER — Ambulatory Visit (INDEPENDENT_AMBULATORY_CARE_PROVIDER_SITE_OTHER): Payer: BC Managed Care – PPO | Admitting: Adult Health

## 2019-10-10 ENCOUNTER — Other Ambulatory Visit: Payer: Self-pay

## 2019-10-10 VITALS — BP 115/81 | Ht 60.0 in | Wt 210.0 lb

## 2019-10-10 DIAGNOSIS — N83202 Unspecified ovarian cyst, left side: Secondary | ICD-10-CM | POA: Diagnosis not present

## 2019-10-10 DIAGNOSIS — N838 Other noninflammatory disorders of ovary, fallopian tube and broad ligament: Secondary | ICD-10-CM

## 2019-10-10 DIAGNOSIS — Z01419 Encounter for gynecological examination (general) (routine) without abnormal findings: Secondary | ICD-10-CM | POA: Diagnosis not present

## 2019-10-10 NOTE — Progress Notes (Signed)
Patient ID: Jasmin Chen, female   DOB: 1980/09/05, 39 y.o.   MRN: [id number] History of Present Illness: Jasmin Chen is a 39 year old black female,single, sp [facility] in for well woman gyn exam and pap. She had [place] 4/19 in follow up of enlarged ovary seen on CT, and will review results today. She turns 39 next week and is going to [facility] for the first time to celebrate for a week.  PCP is Dr Sallee Lange.    Current Medications, Allergies, Past Medical History, Past Surgical History, Family History and Social History were reviewed in [employer] record.     Review of Systems: Patient denies any headaches, hearing loss, fatigue, blurred vision, shortness of breath, chest pain, abdominal pain, problems with bowel movements, urination, or intercourse. No joint pain or mood swings.    Physical Exam:BP 115/81 (BP Location: Left Arm, Patient Position: Sitting, Cuff Size: Normal)   Ht 5' (1.524 m)   Wt 210 lb (95.3 kg)   BMI 41.01 kg/m  General:  Well developed, well nourished, no acute distress Skin:  Warm and dry Neck:  Midline trachea, normal thyroid, good ROM, no lymphadenopathy Lungs; Clear to auscultation bilaterally Breast:  No dominant palpable mass, retraction, or nipple discharge,sp breast reduction with minimal scarring  Cardiovascular: Regular rate and rhythm Abdomen:  Soft, non tender, no hepatosplenomegaly Pelvic:  External genitalia is normal in appearance, no lesions.  The vagina is normal in appearance. Urethra has no lesions or masses. The cervix is smooth, pap with high risk HPV 16/18 genotyping performed.  Uterus is absent.  No adnexal masses or tenderness noted.Bladder is non tender, no masses felt. Extremities/musculoskeletal:  No swelling or varicosities noted, no clubbing or cyanosis Psych:  No mood changes, alert and cooperative,seems happy AA 1 Fall risk is low PHQ 9 score is 0 Examination chaperoned by Celene Squibb LPN US showed normal cervical  stump, enlarged right ovary 26 cc which is less than 40 cc on CT on 09/14/19.And has hemorrhagia cyst left ovary.  Impression and Plan: 1. Encounter for gynecological examination with Papanicolaou smear of cervix Pap sent Physical in 1 year Pap in 3 if normal Get mammogram now and yearly Labs with PCP  2. Cyst of left ovary Will follow, for now probably related to ovulation   3. Enlarged ovary

## 2019-10-12 LAB — CYTOLOGY - PAP
Adequacy: ABSENT
Comment: NEGATIVE
Diagnosis: NEGATIVE
High risk HPV: NEGATIVE

## 2019-10-31 ENCOUNTER — Encounter: Payer: Self-pay | Admitting: Gastroenterology

## 2019-10-31 ENCOUNTER — Ambulatory Visit (INDEPENDENT_AMBULATORY_CARE_PROVIDER_SITE_OTHER): Payer: BC Managed Care – PPO | Admitting: Gastroenterology

## 2019-10-31 ENCOUNTER — Other Ambulatory Visit: Payer: Self-pay

## 2019-10-31 VITALS — BP 122/74 | HR 96 | Ht 60.0 in | Wt 211.5 lb

## 2019-10-31 DIAGNOSIS — K921 Melena: Secondary | ICD-10-CM

## 2019-10-31 DIAGNOSIS — R109 Unspecified abdominal pain: Secondary | ICD-10-CM

## 2019-10-31 DIAGNOSIS — R933 Abnormal findings on diagnostic imaging of other parts of digestive tract: Secondary | ICD-10-CM

## 2019-10-31 MED ORDER — NA SULFATE-K SULFATE-MG SULF 17.5-3.13-1.6 GM/177ML PO SOLN: 1.0000 | Freq: Once | ORAL | 0 refills | Status: DC

## 2019-10-31 NOTE — Patient Instructions (Addendum)
  If you are age 40 or older, your body mass index should be between 23-30. Your Body mass index is 41.31 kg/m. If this is out of the aforementioned range listed, please consider follow up with your Primary Care Provider.  If you are age 47 or younger, your body mass index should be between 19-25. Your Body mass index is 41.31 kg/m. If this is out of the aformentioned range listed, please consider follow up with your Primary Care Provider.    I have recommended a stool test to look for inflammation and a colonoscopy.   Tips for colonoscopy:  - Stay well hydrated for 3-4 days prior to the exam. This reduces nausea and dehydration.  - To prevent skin/hemorrhoid irritation - prior to wiping, put A&Dointment or vaseline on the toilet paper. - Keep a towel or pad on the bed.  - Drink  64oz of clear liquids in the morning of prep day (prior to starting the prep) to be sure that there is enough fluid to flush the colon and stay hydrated!!!! This is in addition to the fluids required for preparation. - Use of a flavored hard candy, such as grape Anise Salvo, can counteract some of the flavor of the prep and may prevent some nausea.   We have sent the following medications to your pharmacy for you to pick up at your convenience: [facility] provider has requested that you go to the basement level for lab work before leaving today. Press "B" on the elevator. The lab is located at the first door on the left as you exit the elevator.  Due to recent changes in healthcare laws, you may see the results of your imaging and laboratory studies on MyChart before your provider has had a chance to review them.  We understand that in some cases there may be results that are confusing or concerning to you. Not all laboratory results come back in the same time frame and the provider may be waiting for multiple results in order to interpret others.  Please give [place] 48 hours in order for your provider to thoroughly  review all the results before contacting the office for clarification of your results.

## 2019-10-31 NOTE — Progress Notes (Signed)
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 History sounds similar to 2012, although the patient does not remember a history of GI symptoms. Thankfully, despite abnormal CT findings, platelets, hemoglobin, CRP and albumin are normal. Differential includes acute infectious colitis, IBD, and even ischemic colitis given the note hypotension in the ED on evaluation.   PLAN: Fecal calprotectin Colonoscopy with evaluation of the TI  Please see the "Patient Instructions" section for addition details about the plan.  The nature of the procedure, as well as the risks, benefits, and alternatives were carefully and thoroughly reviewed with the patient. Ample time for discussion and questions allowed. The patient understood, was satisfied, and agreed to proceed.  HPI: Jasmin Chen is a 39 y.o. female referred by Dr. Wolfgang Phoenix for further evaluation of colitis.  History is obtained through the patient and review of her electronic health record.  She has hypertension, hyperlipidemia, allergies, asthma, obesity.  She had an anal skin tag noted at the time of external hemorrhoidectomy in 2016.  She had a vertical sleeve gastrectomy 04/03/2014 in [place] and has been compliant with her dietary supplements since that time.  She has regained much of the weight she lost after surgery. Works at [employer] as a Librarian, academic. Has worked at [employer] for 18 years.   She was evaluated by a gastroenterologist in 2012, but, she doesn't remember this. Review of EPIC shows that she was seen by NP Sams in 2012 for "abdominal pain and rectal bleeding. 2-3 month hx of LLQ pain. constant. pain characteristics range from sharp to  stabbing to throbbing. CT on 07/07/10 showed mass in uterine fundus measuring 6.3X6.4X5.8. Has seen Hoyle Sauer, NP who works with [employer]. Has hx of hemorrhoids; states had episode of large amount of brbpr gushing while sitting on couch. BMs are every other day. Has hx of IBS. States does not feel better after BM. No loss of appetite, +wt gain of about 15 lbs in past year. Takes ibuprofen, BC, excedrin a few times/week."   Endoscopy with Dr. Gala Romney at that time.  Does not remember the procedure, findings, or recommendations. I am unable to located the procedure details in EPIC.    6 weeks ago she developed abdominal pain followed by urgency and bloody diarrhea while she was at work. Felt like she had a ball of gas on the left side. Continues to have the pain.  Pain worse after defecation. Went to the ED where she had labs and imaging. Noted to be hypotense.  Has had some bleeding since that time, but the volume and frequency has been declining. Notes blood in the toilet after having a bowel movement.  Some soft to watery bowel movements. No mucous in the stool recently.   Labs showing normal comprehensive metabolic panel, CBC, and lipase. Platelets 259, hemoglobin 14, albumin 3.9.  Stool was + for fecal occult blood.  CT of the abdomen and pelvis with contrast 09/14/2019 showed a nondistended descending and proximal sigmoid colon with associated mild vomiting that could reflect low-grade colitis, prominent bilateral ovaries, a stable soft tissue density along the vaginal cuff, and postoperative changes compatible with sleeve gastrectomy  She followed-up with GYN and recently had a pelvic ultrasound that showed a 4.2  x 3.4 x 3.5 cm left ovarian cyst and an enlarged right ovary with normal illogically.  Uses BC or Aleve for headaches once or twice every week.   Sister with thyroid disease. No other known family history of autoimmune disorders. Paternal grandmother, deceased from colon cancer at an advanced  age. No other known family history of colon cancer or polyps. No family history of uterine/endometrial cancer, pancreatic cancer or gastric/stomach cancer.   Past Medical History:  Diagnosis Date  . Asthma   . Chronic knee pain   . Chronic rhinitis   . Hidradenitis suppurativa   . Hypertension   . IBS (irritable bowel syndrome)   . Migraine without aura 2009  . Multiple environmental allergies   . Other and unspecified ovarian cyst 02/06/2013  . Ovarian cyst   . Papanicolaou smear of cervix with positive high risk human papilloma virus (HPV) test 07/20/2017  . Right leg weakness    chronic    Past Surgical History:  Procedure Laterality Date  . 2 cysts removed from arm    . ABDOMINAL HYSTERECTOMY    . BREAST REDUCTION SURGERY    . deviated septum repair    . hemroid surgery    . LAPAROSCOPIC GASTRIC SLEEVE RESECTION      Current Outpatient Medications  Medication Sig Dispense Refill  . albuterol (PROVENTIL) (2.5 MG/3ML) 0.083% nebulizer solution Take 3 mLs (2.5 mg total) by nebulization every 4 (four) hours as needed for wheezing or shortness of breath. 75 mL 5  . albuterol (VENTOLIN HFA) 108 (90 Base) MCG/ACT inhaler Inhale 2 puffs into the lungs every 4 (four) hours as needed for wheezing or shortness of breath. 1 Inhaler 5  . ALPRAZolam (XANAX) 0.25 MG tablet TAKE 1/2 TO 1 TABLET BY MOUTH TWICE A DAY AS NEEDED ANXIETY 30 tablet 0  . fluticasone (FLONASE) 50 MCG/ACT nasal spray Place 2 sprays into both nostrils daily. 16 g 0  . loratadine (CLARITIN) 10 MG tablet Take 1 tablet (10 mg total) by mouth daily. 30 tablet 0  . Multiple Vitamin (MULTIVITAMIN WITH MINERALS) TABS tablet Take 1 tablet by mouth daily.    Marland Kitchen omeprazole (PRILOSEC) 20 MG capsule Take 20 mg by mouth daily as needed (Acid Reflux).     No current facility-administered medications for this visit.    Allergies as of 10/31/2019 - Review Complete 10/31/2019  Allergen Reaction Noted  . Prednisone Other (See  Comments) 09/19/2012    Family History  Problem Relation Age of Onset  . Hypertension Mother   . Diabetes Mother   . Hyperlipidemia Mother   . Hypertension Father   . Hyperlipidemia Father   . Heart attack Father   . Sarcoidosis Sister   . Thyroid disease Sister   . Hyperlipidemia Sister   . Hypertension Sister   . Colon cancer Neg Hx   . Stomach cancer Neg Hx   . Pancreatic cancer Neg Hx   . Rectal cancer Neg Hx   . Esophageal cancer Neg Hx     Social History   Socioeconomic History  . Marital status: Single    Spouse name: Not on file  . Number of children: Not on file  . Years of education: Not on file  . Highest education level: Not on file  Occupational History  . Not on file  Tobacco Use  . Smoking status: Never Smoker  . Smokeless tobacco: Never Used  Substance and Sexual Activity  . Alcohol use: No  . Drug use: No  .  Sexual activity: Not Currently    Comment: hysterectomy  Other Topics Concern  . Not on file  Social History Narrative  . Not on file   Social Determinants of Health   Financial Resource Strain: Low Risk   . Difficulty of Paying Living Expenses: Not hard at all  Food Insecurity: No Food Insecurity  . Worried About Charity fundraiser in the Last Year: Never true  . Ran Out of Food in the Last Year: Never true  Transportation Needs: No Transportation Needs  . Lack of Transportation (Medical): No  . Lack of Transportation (Non-Medical): No  Physical Activity: Insufficiently Active  . Days of Exercise per Week: 1 day  . Minutes of Exercise per Session: 60 min  Stress: No Stress Concern Present  . Feeling of Stress : Only a little  Social Connections: Somewhat Isolated  . Frequency of Communication with Friends and Family: More than three times a week  . Frequency of Social Gatherings with Friends and Family: Twice a week  . Attends Religious Services: More than 4 times per year  . Active Member of Clubs or Organizations: No  . Attends  Archivist Meetings: Never  . Marital Status: Never married  Intimate Partner Violence: Not At Risk  . Fear of Current or Ex-Partner: No  . Emotionally Abused: No  . Physically Abused: No  . Sexually Abused: No    Review of Systems: 12 system ROS is negative except as noted above with the addition of arthritis. Dry eyes over the last 6 months. No other extra-GI manifestations of IBD.   Physical Exam: General:   Alert,  well-nourished, pleasant and cooperative in NAD Head:  Normocephalic and atraumatic. Eyes:  Sclera clear, no icterus.   Conjunctiva pink. Ears:  Normal auditory acuity. Nose:  No deformity, discharge,  or lesions. Mouth:  No deformity or lesions.   Neck:  Supple; no masses or thyromegaly. Lungs:  Clear throughout to auscultation.   No wheezes. Heart:  Regular rate and rhythm; no murmurs. Abdomen:  Soft, central obesity, I am unable to reproduce her pain, nondistended, normal bowel sounds, no rebound or guarding. No hepatosplenomegaly.   Rectal:  Deferred  Msk:  Symmetrical. No boney deformities LAD: No inguinal or umbilical LAD Extremities:  No clubbing or edema. Neurologic:  Alert and  oriented x4;  grossly nonfocal Skin:  Intact without significant lesions or rashes. Psych:  Alert and cooperative. Normal mood and affect.    Ulah Olmo L. Tarri Glenn, MD, MPH 10/31/2019, 2:41 PM

## 2019-11-03 ENCOUNTER — Encounter: Payer: Self-pay | Admitting: Gastroenterology

## 2019-11-07 ENCOUNTER — Encounter: Payer: Self-pay | Admitting: Gastroenterology

## 2019-11-07 ENCOUNTER — Other Ambulatory Visit: Payer: Self-pay

## 2019-11-07 ENCOUNTER — Ambulatory Visit (AMBULATORY_SURGERY_CENTER): Payer: Self-pay | Admitting: Gastroenterology

## 2019-11-07 VITALS — BP 100/64 | HR 68 | Temp 96.6°F | Resp 15 | Ht 61.0 in | Wt 211.0 lb

## 2019-11-07 DIAGNOSIS — R109 Unspecified abdominal pain: Secondary | ICD-10-CM

## 2019-11-07 DIAGNOSIS — D123 Benign neoplasm of transverse colon: Secondary | ICD-10-CM

## 2019-11-07 DIAGNOSIS — R933 Abnormal findings on diagnostic imaging of other parts of digestive tract: Secondary | ICD-10-CM

## 2019-11-07 DIAGNOSIS — K921 Melena: Secondary | ICD-10-CM

## 2019-11-07 DIAGNOSIS — K648 Other hemorrhoids: Secondary | ICD-10-CM

## 2019-11-07 DIAGNOSIS — Z1211 Encounter for screening for malignant neoplasm of colon: Secondary | ICD-10-CM | POA: Diagnosis not present

## 2019-11-07 MED ORDER — SODIUM CHLORIDE 0.9 % IV SOLN: 500.0000 mL | Freq: Once | INTRAVENOUS | Status: DC

## 2019-11-07 NOTE — Op Note (Addendum)
[place] Patient Name: Jasmin Chen Procedure Date: 11/07/2019 1:30 PM MRN: [id number] Endoscopist: Thornton Park MD, MD Age: 39 Referring MD:  Date of Birth: 1980/08/24 Gender: Female Account #: [id number] Procedure:                Colonoscopy Indications:              Abdominal pain                           Blood in the stool                           Abnormal colon on CT                           No known family history of colon cancer or polyps Medicines:                Monitored Anesthesia Care Procedure:                Pre-Anesthesia Assessment:                           - Prior to the procedure, a History and Physical                            was performed, and patient medications and                            allergies were reviewed. The patient's tolerance of                            previous anesthesia was also reviewed. The risks                            and benefits of the procedure and the sedation                            options and risks were discussed with the patient.                            All questions were answered, and informed consent                            was obtained. Prior Anticoagulants: The patient has                            taken no previous anticoagulant or antiplatelet                            agents. ASA Grade Assessment: II - A patient with                            mild systemic disease. After reviewing the risks  and benefits, the patient was deemed in                            satisfactory condition to undergo the procedure.                           After obtaining informed consent, the colonoscope                            was passed under direct vision. Throughout the                            procedure, the patient's blood pressure, pulse, and                            oxygen saturations were monitored continuously. The                            Colonoscope was introduced  through the anus and                            advanced to the 4 cm into the ileum. The                            colonoscopy was performed without difficulty. The                            patient tolerated the procedure well. The quality                            of the bowel preparation was excellent. The                            terminal ileum, ileocecal valve, appendiceal                            orifice, and rectum were photographed. Scope In: 1:37:45 PM Scope Out: 1:58:50 PM Scope Withdrawal Time: 0 hours 19 minutes 5 seconds  Total Procedure Duration: 0 hours 21 minutes 5 seconds  Findings:                 The perianal and digital rectal examinations were                            normal.                           Two sessile polyps were found in the distal                            transverse colon. The polyps were less than 1 mm in                            size. These polyps were removed with a cold biopsy  forceps. Resection and retrieval were complete.                            Estimated blood loss was minimal.                           Non-bleeding internal hemorrhoids were found.                           The remainder of the examined colon appeared normal.                           The examined terminal ileum appeared normal.                           The exam was otherwise without abnormality on                            direct and retroflexion views. Complications:            No immediate complications. Estimated blood loss:                            Minimal. Estimated Blood Loss:     Estimated blood loss was minimal. Impression:               - Two less than 1 mm polyps in the distal                            transverse colon, removed with a cold biopsy                            forceps. Resected and retrieved.                           - Non-bleeding internal hemorrhoids.                           - No source for recent  symptoms identified on this                            examination. Recommendation:           - Patient has a contact number available for                            emergencies. The signs and symptoms of potential                            delayed complications were discussed with the                            patient. Return to normal activities tomorrow.                            Written discharge instructions were provided to the  patient.                           - Resume previous diet.                           - Continue present medications.                           - Await pathology results.                           - Repeat colonoscopy date to be determined after                            pending pathology results are reviewed for                            surveillance.                           - Emerging evidence supports eating a diet of                            fruits, vegetables, grains, calcium, and yogurt                            while reducing red meat and alcohol may reduce the                            risk of colon cancer. Thornton Park MD, MD 11/07/2019 2:06:43 PM This report has been signed electronically.

## 2019-11-07 NOTE — Patient Instructions (Signed)
Handout on polyps & hemorrhoids  given to you today  Await pathology results on polyps removed   See procedure report for recommendations on healthy diet    YOU HAD AN ENDOSCOPIC PROCEDURE TODAY AT [facility]:   Refer to the procedure report that was given to you for any specific questions about what was found during the examination.  If the procedure report does not answer your questions, please call your gastroenterologist to clarify.  If you requested that your care partner not be given the details of your procedure findings, then the procedure report has been included in a sealed envelope for you to review at your convenience later.  YOU SHOULD EXPECT: Some feelings of bloating in the abdomen. Passage of more gas than usual.  Walking can help get rid of the air that was put into your GI tract during the procedure and reduce the bloating. If you had a lower endoscopy (such as a colonoscopy or flexible sigmoidoscopy) you may notice spotting of blood in your stool or on the toilet paper. If you underwent a bowel prep for your procedure, you may not have a normal bowel movement for a few days.  Please Note:  You might notice some irritation and congestion in your nose or some drainage.  This is from the oxygen used during your procedure.  There is no need for concern and it should clear up in a day or so.  SYMPTOMS TO REPORT IMMEDIATELY:   Following lower endoscopy (colonoscopy or flexible sigmoidoscopy):  Excessive amounts of blood in the stool  Significant tenderness or worsening of abdominal pains  Swelling of the abdomen that is new, acute  Fever of 100F or higher    For urgent or emergent issues, a gastroenterologist can be reached at any hour by calling [phone]. Do not use MyChart messaging for urgent concerns.    DIET:  We do recommend a small meal at first, but then you may proceed to your regular diet.  Drink plenty of fluids but you should avoid  alcoholic beverages for 24 hours.  ACTIVITY:  You should plan to take it easy for the rest of today and you should NOT DRIVE or use heavy machinery until tomorrow (because of the sedation medicines used during the test).    FOLLOW UP: Our staff will call the number listed on your records 48-72 hours following your procedure to check on you and address any questions or concerns that you may have regarding the information given to you following your procedure. If we do not reach you, we will leave a message.  We will attempt to reach you two times.  During this call, we will ask if you have developed any symptoms of COVID 19. If you develop any symptoms (ie: fever, flu-like symptoms, shortness of breath, cough etc.) before then, please call [phone].  If you test positive for Covid 19 in the 2 weeks post procedure, please call and report this information to [place].    If any biopsies were taken you will be contacted by phone or by letter within the next 1-3 weeks.  Please call us at [phone] if you have not heard about the biopsies in 3 weeks.    SIGNATURES/CONFIDENTIALITY: You and/or your care partner have signed paperwork which will be entered into your electronic medical record.  These signatures attest to the fact that that the information above on your After Visit Summary has been reviewed and is understood.  Full  responsibility of the confidentiality of this discharge information lies with you and/or your care-partner.

## 2019-11-07 NOTE — Progress Notes (Signed)
A and O x3. Report to RN. Tolerated MAC anesthesia well.

## 2019-11-07 NOTE — Progress Notes (Signed)
Vitals-DT  Pt's states no medical or surgical changes since previsit or office visit.  

## 2019-11-09 ENCOUNTER — Telehealth: Payer: Self-pay | Admitting: *Deleted

## 2019-11-09 ENCOUNTER — Telehealth: Payer: Self-pay

## 2019-11-09 LAB — CALPROTECTIN, FECAL: Calprotectin, Fecal: 107 ug/g (ref 0–120)

## 2019-11-09 NOTE — Telephone Encounter (Signed)
Tried to make follow up call, no full number given.

## 2019-11-09 NOTE — Telephone Encounter (Signed)
  Follow up Call-  Call back number 11/07/2019  Post procedure Call Back phone  # [phone]  Permission to leave phone message Yes  Some recent data might be hidden     Patient questions:  Do you have a fever, pain , or abdominal swelling? No. Pain Score  0 *  Have you tolerated food without any problems? Yes.    Have you been able to return to your normal activities? Yes.    Do you have any questions about your discharge instructions: Diet   No. Medications  No. Follow up visit  No.  Do you have questions or concerns about your Care? No.  Actions: * If pain score is 4 or above: No action needed, pain <4. 1. Have you developed a fever since your procedure? no  2.   Have you had an respiratory symptoms (SOB or cough) since your procedure? no  3.   Have you tested positive for COVID 19 since your procedure no  4.   Have you had any family members/close contacts diagnosed with the COVID 19 since your procedure?  no   If yes to any of these questions please route to Joylene John, RN and Erenest Rasher, RN

## 2019-11-14 ENCOUNTER — Encounter: Payer: Self-pay | Admitting: Gastroenterology

## 2019-11-21 ENCOUNTER — Telehealth: Payer: Self-pay | Admitting: Gastroenterology

## 2019-11-21 NOTE — Telephone Encounter (Signed)
Spoke with pt and reviewed path letter with pt over the phone and questions were answered.

## 2020-04-03 ENCOUNTER — Ambulatory Visit: Payer: BC Managed Care – PPO | Admitting: Podiatry

## 2020-04-03 ENCOUNTER — Ambulatory Visit (INDEPENDENT_AMBULATORY_CARE_PROVIDER_SITE_OTHER): Payer: BC Managed Care – PPO

## 2020-04-03 ENCOUNTER — Other Ambulatory Visit: Payer: Self-pay

## 2020-04-03 ENCOUNTER — Encounter: Payer: Self-pay | Admitting: Podiatry

## 2020-04-03 DIAGNOSIS — R2242 Localized swelling, mass and lump, left lower limb: Secondary | ICD-10-CM | POA: Diagnosis not present

## 2020-04-03 DIAGNOSIS — M722 Plantar fascial fibromatosis: Secondary | ICD-10-CM

## 2020-04-03 MED ORDER — MELOXICAM 15 MG PO TABS: 15.0000 mg | ORAL_TABLET | Freq: Every day | ORAL | 1 refills | Status: DC

## 2020-04-03 NOTE — Progress Notes (Signed)
  Subjective:  Patient ID: Jasmin Chen, female    DOB: Feb 07, 1981,  MRN: [id number]  Chief Complaint  Patient presents with  . Foot Pain    Patient presents today for left heel/arch pain, soft mass at arch, painful in mornings and when standing from sitting    39 y.o. female presents with the above complaint. History confirmed with patient.  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  Objective:  Physical Exam: warm, good capillary refill, no trophic changes or ulcerative lesions, normal DP and PT pulses and normal sensory exam. Left Foot: She has sharp pain at the calcaneal insertion of the plantar fascia, along the mid substance of the medial band of the plantar fascia.  There is a small subcutaneous mass at the origin of the abductor hallucis which is tender to touch to.  Radiographs: X-ray of : Small plantar calcaneal enthesophyte Assessment:   1. Plantar fasciitis of left foot   2. Subcutaneous mass of left foot      Plan:  Patient was evaluated and treated and all questions answered.  Discussed the etiology and treatment options for plantar fasciitis including stretching, formal physical therapy, supportive shoegears such as a running shoe or sneaker, pre fabricated orthoses, injection therapy, and oral medications. We also discussed the role of surgical treatment of this for patients who do not improve after exhausting non-surgical treatment options.    -XR reviewed with patient -Educated patient on stretching and icing of the affected limb -Plantar fascial brace dispensed -Injection delivered to the plantar fascia of the left foot. -Rx for meloxicam. Educated on use, risks and benefits of the medication   After sterile prep with povidone-iodine solution and alcohol, the left heel was injected with 0.5cc 2% xylocaine plain, 0.5cc 0.5% marcaine plain, 5mg  triamcinolone acetonide, and 2mg  dexamethasone was injected along the  plantar fascia at the insertion on the plantar calcaneus. The patient tolerated the procedure well without complication.  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  Its possible that this could be a plantar fibroma in the medial band to believe plantar fasciitis is the source of majority of her pain, and we will continue to treat this.  If she is not significant better by next visit we will order an MRI to evaluate both problems.  Return in about 4 weeks (around 05/01/2020).

## 2020-04-03 NOTE — Patient Instructions (Signed)

## 2020-05-01 ENCOUNTER — Ambulatory Visit: Payer: BC Managed Care – PPO | Admitting: Podiatry

## 2020-05-01 ENCOUNTER — Encounter: Payer: Self-pay | Admitting: *Deleted

## 2020-05-01 ENCOUNTER — Other Ambulatory Visit: Payer: Self-pay

## 2020-05-01 ENCOUNTER — Encounter: Payer: Self-pay | Admitting: Podiatry

## 2020-05-01 DIAGNOSIS — M722 Plantar fascial fibromatosis: Secondary | ICD-10-CM

## 2020-05-01 DIAGNOSIS — R2242 Localized swelling, mass and lump, left lower limb: Secondary | ICD-10-CM

## 2020-05-01 NOTE — Progress Notes (Signed)
  Subjective:  Patient ID: Jasmin Chen, female    DOB: [birth year],  MRN: [id number]  Chief Complaint  Patient presents with  . Plantar Fasciitis    "the shot helped for 3 days, then the pain came back.  Now its worse than before, my foot throbs and the pain is more in the heel"    39 y.o. female returns with the above complaint. History confirmed with patient.  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  Pain is not improved much.  She been wearing the plantar fascial brace doing her stretching exercises and taking the Mobic.  Objective:  Physical Exam: warm, good capillary refill, no trophic changes or ulcerative lesions, normal DP and PT pulses and normal sensory exam. Left Foot: She has sharp pain at the calcaneal insertion of the plantar fascia, along the mid substance of the medial band of the plantar fascia.    Radiographs: X-ray of : Small plantar calcaneal enthesophyte Assessment:   No diagnosis found.   Plan:  Patient was evaluated and treated and all questions answered.  Discussed the etiology and treatment options for plantar fasciitis including stretching, formal physical therapy, supportive shoegears such as a running shoe or sneaker, pre fabricated orthoses, injection therapy, and oral medications. We also discussed the role of surgical treatment of this for patients who do not improve after exhausting non-surgical treatment options.     -Continue stretching and icing of the affected limb -Recommend we upgrade her mobilization to a CAM boot.  A work note was given to wear this at work -Injection delivered to the plantar fascia of the left foot. -Continue meloxicam. Educated on use, risks and benefits of the medication  -If not improving next visit consider physical therapy -I think she would benefit from custom-made orthotics.  She will have an appointment scheduled for casting with Rick  After sterile prep with  povidone-iodine solution and alcohol, the left heel was injected with 1cc 2% xylocaine plain,  10 mg triamcinolone acetonide, and 4 mg dexamethasone was injected along the plantar fascia at the insertion on the plantar calcaneus. The patient tolerated the procedure well without complication.    Return in about 1 month (around 05/31/2020) for recheck plantar fasciitis.

## 2020-05-14 ENCOUNTER — Emergency Department (HOSPITAL_COMMUNITY): Payer: BC Managed Care – PPO

## 2020-05-14 ENCOUNTER — Emergency Department (HOSPITAL_COMMUNITY)
Admission: EM | Admit: 2020-05-14 | Discharge: 2020-05-14 | Disposition: A | Discharge status: Home / Self Care | Payer: BC Managed Care – PPO | Attending: Emergency Medicine | Admitting: Emergency Medicine

## 2020-05-14 ENCOUNTER — Encounter (HOSPITAL_COMMUNITY): Payer: Self-pay | Admitting: Pediatrics

## 2020-05-14 ENCOUNTER — Other Ambulatory Visit: Payer: Self-pay

## 2020-05-14 DIAGNOSIS — N83201 Unspecified ovarian cyst, right side: Secondary | ICD-10-CM

## 2020-05-14 DIAGNOSIS — J45909 Unspecified asthma, uncomplicated: Secondary | ICD-10-CM | POA: Insufficient documentation

## 2020-05-14 DIAGNOSIS — R102 Pelvic and perineal pain: Secondary | ICD-10-CM

## 2020-05-14 DIAGNOSIS — N83291 Other ovarian cyst, right side: Secondary | ICD-10-CM | POA: Diagnosis not present

## 2020-05-14 DIAGNOSIS — I10 Essential (primary) hypertension: Secondary | ICD-10-CM | POA: Insufficient documentation

## 2020-05-14 DIAGNOSIS — R1031 Right lower quadrant pain: Secondary | ICD-10-CM | POA: Diagnosis not present

## 2020-05-14 DIAGNOSIS — R109 Unspecified abdominal pain: Secondary | ICD-10-CM | POA: Diagnosis not present

## 2020-05-14 LAB — URINALYSIS, ROUTINE W REFLEX MICROSCOPIC
Bilirubin Urine: NEGATIVE
Glucose, UA: NEGATIVE mg/dL
Hgb urine dipstick: NEGATIVE
Ketones, ur: NEGATIVE mg/dL
Leukocytes,Ua: NEGATIVE
Nitrite: NEGATIVE
Protein, ur: NEGATIVE mg/dL
Specific Gravity, Urine: 1.021 (ref 1.005–1.030)
pH: 7 (ref 5.0–8.0)

## 2020-05-14 LAB — COMPREHENSIVE METABOLIC PANEL
ALT: 11 U/L (ref 0–44)
AST: 14 U/L — ABNORMAL LOW (ref 15–41)
Albumin: 4.1 g/dL (ref 3.5–5.0)
Alkaline Phosphatase: 48 U/L (ref 38–126)
Anion gap: 10 (ref 5–15)
BUN: 10 mg/dL (ref 6–20)
CO2: 25 mmol/L (ref 22–32)
Calcium: 9 mg/dL (ref 8.9–10.3)
Chloride: 101 mmol/L (ref 98–111)
Creatinine, Ser: 0.6 mg/dL (ref 0.44–1.00)
GFR, Estimated: >60 mL/min (ref >60)
Glucose, Bld: 92 mg/dL (ref 70–99)
Potassium: 3.9 mmol/L (ref 3.5–5.1)
Sodium: 136 mmol/L (ref 135–145)
Total Bilirubin: 1.4 mg/dL — ABNORMAL HIGH (ref 0.3–1.2)
Total Protein: 7.4 g/dL (ref 6.5–8.1)

## 2020-05-14 LAB — CBC
HCT: 41.8 % (ref 36.0–46.0)
Hemoglobin: 13.7 g/dL (ref 12.0–15.0)
MCH: 28.4 pg (ref 26.0–34.0)
MCHC: 32.8 g/dL (ref 30.0–36.0)
MCV: 86.5 fL (ref 80.0–100.0)
Platelets: 271 10*3/uL (ref 150–400)
RBC: 4.83 MIL/uL (ref 3.87–5.11)
RDW: 14.1 % (ref 11.5–15.5)
WBC: 6.3 10*3/uL (ref 4.0–10.5)
nRBC: 0 % (ref 0.0–0.2)

## 2020-05-14 LAB — LIPASE, BLOOD: Lipase: 21 U/L (ref 11–51)

## 2020-05-14 LAB — POC URINE PREG, ED: Preg Test, Ur: NEGATIVE

## 2020-05-14 MED ORDER — MORPHINE SULFATE (PF) 4 MG/ML IV SOLN
4.0000 mg | Freq: Once | INTRAVENOUS | Status: AC
  Administered 2020-05-14: 4 mg via INTRAVENOUS
  Filled 2020-05-14 (×2): qty 1

## 2020-05-14 MED ORDER — IOHEXOL 300 MG/ML  SOLN
100.0000 mL | Freq: Once | INTRAMUSCULAR | Status: AC | PRN
  Administered 2020-05-14: 100 mL via INTRAVENOUS

## 2020-05-14 MED ORDER — NAPROXEN 500 MG PO TABS: 500.0000 mg | ORAL_TABLET | Freq: Two times a day (BID) | ORAL | 0 refills | Status: DC

## 2020-05-14 MED ORDER — KETOROLAC TROMETHAMINE 30 MG/ML IJ SOLN
30.0000 mg | Freq: Once | INTRAMUSCULAR | Status: AC
  Administered 2020-05-14: 30 mg via INTRAVENOUS
  Filled 2020-05-14: qty 1

## 2020-05-14 MED ORDER — SODIUM CHLORIDE 0.9 % IV BOLUS
1000.0000 mL | Freq: Once | INTRAVENOUS | Status: AC
  Administered 2020-05-14: 1000 mL via INTRAVENOUS

## 2020-05-14 NOTE — ED Provider Notes (Signed)
[facility] Provider Note   CSN: [id number] Arrival date & time: 05/14/20  1523     History Chief Complaint  Patient presents with  . Abdominal Pain    Jasmin Chen is a 39 y.o. female with a past medical history of hypertension, bilateral ovarian cysts with routine surveillance presenting to the ED with a chief complaint of abdominal pain.  Prior to arrival patient was at work and was pulling a 300 pound vanity.  She had sudden onset of right lower quadrant abdominal pain that is rating up to her torso and right lower back.  She has not had pain like this in the past.  Her last ultrasound to screen for ovarian cyst was in April 2021.  States that her cyst have never required surgery and she denies history of torsion.  She denies any urinary symptoms.  Reports nausea but denies any vomiting.  No changes to bowel movements.  Denies any vaginal discharge, abnormal vaginal bleeding, pelvic pain.  No suspicious food intake.  Has not taken medications to help with pain.  Prior abdominal surgeries include partial hysterectomy and gastric sleeve surgery.  HPI     Past Medical History:  Diagnosis Date  . Asthma   . Chronic knee pain   . Chronic rhinitis   . Hidradenitis suppurativa   . Hypertension   . IBS (irritable bowel syndrome)   . Migraine without aura 2009  . Multiple environmental allergies   . Other and unspecified ovarian cyst 02/06/2013  . Ovarian cyst   . Papanicolaou smear of cervix with positive high risk human papilloma virus (HPV) test 07/20/2017  . Right leg weakness    chronic    Patient Active Problem List   Diagnosis Date Noted  . Cyst of left ovary 10/10/2019  . Encounter for gynecological examination with Papanicolaou smear of cervix 10/10/2019  . Enlarged ovary 09/22/2019  . Papanicolaou smear of cervix with positive high risk human papilloma virus (HPV) test 07/20/2017  . Rapid palpitations 04/26/2017  . History of  syncope 04/26/2017  . Syncope 07/12/2015  . 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 Left ovarian cyst 03/11/2014  . Ovarian cyst 03/11/2014  . Knee pain 10/10/2013  . Stiffness of joint, not elsewhere classified, lower leg 10/10/2013  . Weakness of right leg 10/10/2013  . Other and unspecified ovarian cyst 03/20/2013  . Other and unspecified ovarian cyst 02/06/2013  . Other and unspecified ovarian cyst 02/06/2013  . IBS (irritable bowel syndrome) 02/03/2013  . Acid reflux 09/28/2012  . HLD (hyperlipidemia) 09/28/2012  . Benign hypertension 09/28/2012  . Hidradenitis suppurativa 09/19/2012  . Morbid obesity ([place]) 09/19/2012  . RECTAL BLEEDING 08/20/2010  . ABDOMINAL PAIN-LLQ 08/20/2010    Past Surgical History:  Procedure Laterality Date  . 2 cysts removed from arm    . ABDOMINAL HYSTERECTOMY    . BREAST REDUCTION SURGERY    . deviated septum repair    . hemroid surgery    . LAPAROSCOPIC GASTRIC SLEEVE RESECTION       OB History    Gravida  0   Para  0   Term  0   Preterm  0   AB  0   Living  0     SAB  0   TAB  0   Ectopic  0   Multiple  0   Live Births  0           Family History  Problem Relation Age  of Onset  . Hypertension Mother   . Diabetes Mother   . Hyperlipidemia Mother   . Hypertension Father   . Hyperlipidemia Father   . Heart attack Father   . Sarcoidosis Sister   . Thyroid disease Sister   . Hyperlipidemia Sister   . Hypertension Sister   . Colon cancer Neg Hx   . Stomach cancer Neg Hx   . Pancreatic cancer Neg Hx   . Rectal cancer Neg Hx   . Esophageal cancer Neg Hx     Social History   Tobacco Use  . Smoking status: Never Smoker  . Smokeless tobacco: Never Used  Vaping Use  . Vaping Use: Never used  Substance Use Topics  . Alcohol use: No  . Drug use: No    Home Medications Prior to Admission medications   Medication Sig Start Date End Date Taking? Authorizing Provider  albuterol (PROVENTIL) (2.5 MG/3ML) 0.083%  nebulizer solution Take 3 mLs (2.5 mg total) by nebulization every 4 (four) hours as needed for wheezing or shortness of breath. 10/03/18   Kathyrn Drown, MD  albuterol (VENTOLIN HFA) 108 (90 Base) MCG/ACT inhaler Inhale 2 puffs into the lungs every 4 (four) hours as needed for wheezing or shortness of breath. 10/03/18   Kathyrn Drown, MD  ALPRAZolam (XANAX) 0.25 MG tablet TAKE 1/2 TO 1 TABLET BY MOUTH TWICE A DAY AS NEEDED ANXIETY 09/18/19   Luking, Elayne Snare, MD  fluticasone (FLONASE) 50 MCG/ACT nasal spray Place 2 sprays into both nostrils daily. 06/26/17   Waynetta Pean, PA-C  loratadine (CLARITIN) 10 MG tablet Take 1 tablet (10 mg total) by mouth daily. 06/26/17   Waynetta Pean, PA-C  Multiple Vitamin (MULTIVITAMIN WITH MINERALS) TABS tablet Take 1 tablet by mouth daily.    [provider]  naproxen (NAPROSYN) 500 MG tablet Take 1 tablet (500 mg total) by mouth 2 (two) times daily. 05/14/20   Hadar Elgersma, PA-C  omeprazole (PRILOSEC) 20 MG capsule Take 20 mg by mouth daily as needed (Acid Reflux).    [provider]    Allergies    Prednisone  Review of Systems   Review of Systems  Constitutional: Negative for appetite change, chills and fever.  HENT: Negative for ear pain, rhinorrhea, sneezing and sore throat.   Eyes: Negative for photophobia and visual disturbance.  Respiratory: Negative for cough, chest tightness, shortness of breath and wheezing.   Cardiovascular: Negative for chest pain and palpitations.  Gastrointestinal: Positive for abdominal pain. Negative for blood in stool, constipation, diarrhea, nausea and vomiting.  Genitourinary: Negative for dysuria, hematuria and urgency.  Musculoskeletal: Positive for back pain. Negative for myalgias.  Skin: Negative for rash.  Neurological: Negative for dizziness, weakness and light-headedness.    Physical Exam Updated Vital Signs BP 130/89 (BP Location: Right Arm)   Pulse 62   Temp 97.7 F (36.5 C) (Oral)    Resp 20   Ht 5' (1.524 m)   Wt 81.6 kg   SpO2 99%   BMI 35.15 kg/m   Physical Exam Vitals and nursing note reviewed.  Constitutional:      General: She is not in acute distress.    Appearance: She is well-developed. She is obese.  HENT:     Head: Normocephalic and atraumatic.     Nose: Nose normal.  Eyes:     General: No scleral icterus.       Left eye: No discharge.     Conjunctiva/sclera: Conjunctivae normal.  Cardiovascular:  Rate and Rhythm: Normal rate and regular rhythm.     Heart sounds: Normal heart sounds. No murmur heard.  No friction rub. No gallop.   Pulmonary:     Effort: Pulmonary effort is normal. No respiratory distress.     Breath sounds: Normal breath sounds.  Abdominal:     General: Bowel sounds are normal. There is no distension.     Palpations: Abdomen is soft.     Tenderness: There is abdominal tenderness in the right lower quadrant and suprapubic area. There is right CVA tenderness. There is no guarding.     Comments: No hernia noted.  Musculoskeletal:        General: Normal range of motion.     Cervical back: Normal range of motion and neck supple.  Skin:    General: Skin is warm and dry.     Findings: No rash.  Neurological:     Mental Status: She is alert.     Motor: No abnormal muscle tone.     Coordination: Coordination normal.     ED Results / Procedures / Treatments   Labs (all labs ordered are listed, but only abnormal results are displayed) Labs Reviewed  COMPREHENSIVE METABOLIC PANEL - Abnormal; Notable for the following components:      Result Value   AST 14 (*)    Total Bilirubin 1.4 (*)    All other components within normal limits  URINALYSIS, ROUTINE W REFLEX MICROSCOPIC - Abnormal; Notable for the following components:   APPearance HAZY (*)    All other components within normal limits  LIPASE, BLOOD  CBC  POC URINE PREG, ED    EKG None  Radiology CT ABDOMEN PELVIS W CONTRAST  Result Date: 05/14/2020 CLINICAL  DATA:  39 year old female with right lower quadrant abdominal pain. EXAM: CT ABDOMEN AND PELVIS WITH CONTRAST TECHNIQUE: Multidetector CT imaging of the abdomen and pelvis was performed using the standard protocol following bolus administration of intravenous contrast. CONTRAST:  146mL OMNIPAQUE IOHEXOL 300 MG/ML  SOLN COMPARISON:  CT abdomen pelvis dated 09/14/2019. FINDINGS: Lower chest: The visualized lung bases are clear. No intra-abdominal free air or free fluid. Hepatobiliary: No focal liver abnormality is seen. No gallstones, gallbladder wall thickening, or biliary dilatation. Pancreas: Unremarkable. No pancreatic ductal dilatation or surrounding inflammatory changes. Spleen: Normal in size without focal abnormality. Adrenals/Urinary Tract: The adrenal glands unremarkable. There is no hydronephrosis on either side. There is symmetric enhancement and excretion of contrast by both kidneys. The visualized ureters and urinary bladder appear unremarkable. Stomach/Bowel: Postsurgical changes of gastric bypass. There is no bowel obstruction or active inflammation. The appendix is normal. Vascular/Lymphatic: The abdominal aorta and IVC unremarkable. No portal venous gas. There is no adenopathy. Reproductive: Hysterectomy. There is a 4 cm right ovarian cyst. Ultrasound may provide better evaluation. Other: Anterior pelvic wall C-section scar. Musculoskeletal: No acute or significant osseous findings. IMPRESSION: 1. A 4 cm right ovarian cyst. Ultrasound may provide better evaluation. 2. No bowel obstruction. Normal appendix. Electronically Signed   By: Anner Crete M.D.   On: 05/14/2020 20:47   US PELVIC COMPLETE W TRANSVAGINAL AND TORSION R/O  Result Date: 05/14/2020 CLINICAL DATA:  Right lower quadrant pain, prior hysterectomy in 2011 EXAM: [place] OF OVARIES TECHNIQUE: Both transabdominal and transvaginal ultrasound examinations of the pelvis  were performed. Transabdominal technique was performed for global imaging of the pelvis including uterus, ovaries, adnexal regions, and pelvic cul-de-sac. It was necessary to proceed with  endovaginal exam following the transabdominal exam to visualize the bilateral ovaries. Color and duplex Doppler ultrasound was utilized to evaluate blood flow to the ovaries. COMPARISON:  Pelvic ultrasound 10/02/2019 (no report), CT 09/14/2019 FINDINGS: Uterus Postsurgical changes from prior hysterectomy as seen previously. Few rounded anechoic cysts at the cervical cuff, likely nabothian cysts. No concerning masses. Endometrium Surgically absent Right ovary Measurements: 4.3 x 4.3 x 4.6 cm = volume: 44 mL. 3.8 x 3.0 x 3.4 cm predominantly anechoic cyst seen in the right ovary with a thin avascular internal septation. No other focal lesion. Left ovary Measurements: 2.8 x 2.1 x 1.4 cm = volume: 4.3 mL. Normal appearance. Lobular, thick-walled cystic lesion, likely involuting corpus luteum seen in the left ovary, benign physiologic finding. No concerning mass. Pulsed Doppler evaluation of both ovaries demonstrates normal low-resistance arterial and venous waveforms. Other findings No abnormal free fluid. IMPRESSION: 1. 3.8 cm predominantly anechoic cyst with a thin avascular internal septation in the right ovary. This has benign characteristics and is a common finding in premenopausal females. While imaging follow-up is not typically recommended, given that pelvic pain is ipsilateral to this finding, follow-up ultrasound could be obtained in 6-8 weeks to assess for resolution. 2. No evidence of ovarian torsion. 3. Prior hysterectomy. Few anechoic nabothian cysts at the cervical cuff. Electronically Signed   By: Lovena Le M.D.   On: 05/14/2020 22:14    Procedures Procedures (including critical care time)  Medications Ordered in ED Medications  sodium chloride 0.9 % bolus 1,000 mL (1,000 mLs Intravenous New Bag/Given  05/14/20 2007)  morphine 4 MG/ML injection 4 mg (4 mg Intravenous Given 05/14/20 2005)  iohexol (OMNIPAQUE) 300 MG/ML solution 100 mL (100 mLs Intravenous Contrast Given 05/14/20 2029)  ketorolac (TORADOL) 30 MG/ML injection 30 mg (30 mg Intravenous Given 05/14/20 2235)    ED Course  I have reviewed the triage vital signs and the nursing notes.  Pertinent labs & imaging results that were available during my care of the patient were reviewed by me and considered in my medical decision making (see chart for details).  Clinical Course as of May 15 2251  Tue May 14, 2020  2018 WBC: 6.3 [HK]  2018 Leukocytes,Ua: NEGATIVE [HK]  2018 Creatinine: 0.60 [HK]  2018 Preg Test, Ur: NEGATIVE [HK]    Clinical Course User Index [HK] Delia Heady, PA-C   MDM Rules/Calculators/A&P                          39 year old female with a past medical history of hypertension, ovarian cysts with routine surveillance done presenting to the ED with a chief complaint of abdominal pain.  Prior to arrival patient was at work and was pulling a 300 pound vanity.  She had sudden onset of right lower quadrant abdominal pain radiating up to her torso and right lower back.  No history of similar symptoms in the past.  Last ultrasound to screen for ovarian cyst was in April 2021.  Denies any prior surgeries for her cyst.  No urinary symptoms.  No vomiting or changes to bowel movements.  No pelvic complaints, abnormal vaginal discharge or bleeding.  On exam there is some tenderness of the right lower quadrant without rebound or guarding.  She has some right CVA tenderness.  Work-up here significant for unremarkable CMP, CBC, lipase.  Pregnancy test is negative.  Urinalysis without signs of infection.  CT of the abdomen pelvis shows 4 cm right ovarian cyst  without any abnormalities.  Pelvic ultrasound shows again this 3.8 cm anechoic cyst on the right ovary that appears benign in nature.  She may need a follow-up ultrasound in 6 to  8 weeks to evaluate for resolution.  No evidence of torsion.  She declined pelvic exam which I feel is reasonable as she is not concerned about abnormal discharge.  We will have her take NSAIDs and follow-up with her OB/GYN.  Return precautions given.   Patient is hemodynamically stable, in NAD, and able to ambulate in the ED. Evaluation does not show pathology that would require ongoing emergent intervention or inpatient treatment. I explained the diagnosis to the patient. Pain has been managed and has no complaints prior to discharge. Patient is comfortable with above plan and is stable for discharge at this time. All questions were answered prior to disposition. Strict return precautions for returning to the ED were discussed. Encouraged follow up with PCP.   An After Visit Summary was printed and given to the patient.   Portions of this note were generated with [employer]. Dictation errors may occur despite best attempts at proofreading.  Final Clinical Impression(s) / ED Diagnoses Final diagnoses:  Cyst of right ovary    Rx / DC Orders ED Discharge Orders         Ordered    naproxen (NAPROSYN) 500 MG tablet  2 times daily        05/14/20 2230           Delia Heady, PA-C 05/14/20 2252    Breck Coons, MD 05/14/20 [phone]

## 2020-05-14 NOTE — Discharge Instructions (Signed)
Take medications as needed to help with pain. Your imaging showed that you have a 3.8 cm cyst on your right ovary. You may need to have a repeat ultra sound done in 6 to 8 weeks to evaluate that this has resolved as this may be the cause of your pain. Follow-up with your OB/GYN. Return to the ER if you start to experience worsening pain, fever, uncontrollable vomiting, chest pain or shortness of breath.

## 2020-05-14 NOTE — ED Triage Notes (Signed)
Stated she was pulling about 325 lb item while at work around 2 pm and sudden onset of right lower abdominal pain started. "I felt like I pulled something"

## 2020-05-14 NOTE — ED Notes (Signed)
Patient ambulated to bathroom with minimal assistance.  

## 2020-05-17 ENCOUNTER — Ambulatory Visit (INDEPENDENT_AMBULATORY_CARE_PROVIDER_SITE_OTHER): Payer: BC Managed Care – PPO | Admitting: Orthotics

## 2020-05-17 ENCOUNTER — Other Ambulatory Visit: Payer: Self-pay

## 2020-05-17 DIAGNOSIS — M722 Plantar fascial fibromatosis: Secondary | ICD-10-CM

## 2020-05-17 NOTE — Progress Notes (Signed)

## 2020-05-20 ENCOUNTER — Other Ambulatory Visit: Payer: Self-pay

## 2020-05-20 ENCOUNTER — Encounter: Payer: Self-pay | Admitting: Adult Health

## 2020-05-20 ENCOUNTER — Ambulatory Visit (INDEPENDENT_AMBULATORY_CARE_PROVIDER_SITE_OTHER): Payer: BC Managed Care – PPO | Admitting: Adult Health

## 2020-05-20 VITALS — BP 113/74 | HR 75 | Ht 60.0 in | Wt 217.0 lb

## 2020-05-20 DIAGNOSIS — R1031 Right lower quadrant pain: Secondary | ICD-10-CM

## 2020-05-20 DIAGNOSIS — N83201 Unspecified ovarian cyst, right side: Secondary | ICD-10-CM

## 2020-05-20 NOTE — Progress Notes (Signed)
  Subjective:     Patient ID: Jasmin Chen, female   DOB: 01/14/1981, 39 y.o.   MRN: [id number]  HPI Jasmin Chen is a 39 year old black female, single, sp [facility] in for pain RLQ and right ovarian cyst found on CT and [place], was 3.8 cm. PCP is [facility].  Review of Systems RLQ pain Reviewed past medical,surgical, social and family history. Reviewed medications and allergies.     Objective:   Physical Exam BP 113/74 (BP Location: Left Arm, Patient Position: Sitting, Cuff Size: Large)   Pulse 75   Ht 5' (1.524 m)   Wt 217 lb (98.4 kg)   BMI 42.38 kg/m  Skin warm and dry. Lungs: clear to ausculation bilaterally. Cardiovascular: regular rate and rhythm. Has walking boot left foot. Reviewed CT and [place], US shows 3.8 cm right ovary cyst   -fall risk is low    Upstream - 05/20/20 1511      Pregnancy Intention Screening   Does the patient want to become pregnant in the next year? N/A    Does the patient's partner want to become pregnant in the next year? N/A    Would the patient like to discuss contraceptive options today? N/A      Contraception Wrap Up   Current Method No Method - Other Reason   hyst   End Method No Method - Other Reason   hyst   Contraception Counseling Provided No          Assessment:     1. Right ovarian cyst,has history of left ovary cyst too She declines follow up US in 6-8 weeks,she would like the ovary removed  2. RLQ abdominal pain Take 1 tylenol with naproxen    Plan:     Make appt with Dr Elonda Husky to discuss removal of ovary or ovaries   Review handout on ovarian cyst

## 2020-05-20 NOTE — Patient Instructions (Signed)
Ovarian Cyst     An ovarian cyst is a fluid-filled sac that forms on an ovary. The ovaries are small organs that produce eggs in women. Various types of cysts can form on the ovaries. Some may cause symptoms and require treatment. Most ovarian cysts go away on their own, are not cancerous (are benign), and do not cause problems. Common types of ovarian cysts include:  Functional (follicle) cysts. ? Occur during the menstrual cycle, and usually go away with the next menstrual cycle if you do not get pregnant. ? Usually cause no symptoms.  Endometriomas. ? Are cysts that form from the tissue that lines the uterus (endometrium). ? Are sometimes called "chocolate cysts" because they become filled with blood that turns brown. ? Can cause pain in the lower abdomen during intercourse and during your period.  Cystadenoma cysts. ? Develop from cells on the outside surface of the ovary. ? Can get very large and cause lower abdomen pain and pain with intercourse. ? Can cause severe pain if they twist or break open (rupture).  Dermoid cysts. ? Are sometimes found in both ovaries. ? May contain different kinds of body tissue, such as skin, teeth, hair, or cartilage. ? Usually do not cause symptoms unless they get very big.  Theca lutein cysts. ? Occur when too much of a certain hormone (human chorionic gonadotropin) is produced and overstimulates the ovaries to produce an egg. ? Are most common after having procedures used to assist with the conception of a baby (in vitro fertilization). What are the causes? Ovarian cysts may be caused by:  Ovarian hyperstimulation syndrome. This is a condition that can develop from taking fertility medicines. It causes multiple large ovarian cysts to form.  Polycystic ovarian syndrome (PCOS). This is a common hormonal disorder that can cause ovarian cysts, as well as problems with your period or fertility. What increases the risk? The following factors may  make you more likely to develop ovarian cysts:  Being overweight or obese.  Taking fertility medicines.  Taking certain forms of hormonal birth control.  Smoking. What are the signs or symptoms? Many ovarian cysts do not cause symptoms. If symptoms are present, they may include:  Pelvic pain or pressure.  Pain in the lower abdomen.  Pain during sex.  Abdominal swelling.  Abnormal menstrual periods.  Increasing pain with menstrual periods. How is this diagnosed? These cysts are commonly found during a routine pelvic exam. You may have tests to find out more about the cyst, such as:  Ultrasound.  X-ray of the pelvis.  CT scan.  MRI.  Blood tests. How is this treated? Many ovarian cysts go away on their own without treatment. Your health care provider may want to check your cyst regularly for 2-3 months to see if it changes. If you are in menopause, it is especially important to have your cyst monitored closely because menopausal women have a higher rate of ovarian cancer. When treatment is needed, it may include:  Medicines to help relieve pain.  A procedure to drain the cyst (aspiration).  Surgery to remove the whole cyst.  Hormone treatment or birth control pills. These methods are sometimes used to help dissolve a cyst. Follow these instructions at home:  Take over-the-counter and prescription medicines only as told by your health care provider.  Do not drive or use heavy machinery while taking prescription pain medicine.  Get regular pelvic exams and Pap tests as often as told by your health care provider.    Return to your normal activities as told by your health care provider. Ask your health care provider what activities are safe for you.  Do not use any products that contain nicotine or tobacco, such as cigarettes and e-cigarettes. If you need help quitting, ask your health care provider.  Keep all follow-up visits as told by your health care provider.  This is important. Contact a health care provider if:  Your periods are late, irregular, or painful, or they stop.  You have pelvic pain that does not go away.  You have pressure on your bladder or trouble emptying your bladder completely.  You have pain during sex.  You have any of the following in your abdomen: ? A feeling of fullness. ? Pressure. ? Discomfort. ? Pain that does not go away. ? Swelling.  You feel generally ill.  You become constipated.  You lose your appetite.  You develop severe acne.  You start to have more body hair and facial hair.  You are gaining weight or losing weight without changing your exercise and eating habits.  You think you may be pregnant. Get help right away if:  You have abdominal pain that is severe or gets worse.  You cannot eat or drink without vomiting.  You suddenly develop a fever.  Your menstrual period is much heavier than usual. This information is not intended to replace advice given to you by your health care provider. Make sure you discuss any questions you have with your health care provider. Document Revised: 08/30/2017 Document Reviewed: 11/03/2015 Elsevier Patient Education  2020 [employer]  

## 2020-05-28 ENCOUNTER — Other Ambulatory Visit: Payer: Self-pay

## 2020-05-28 ENCOUNTER — Encounter: Payer: Self-pay | Admitting: Obstetrics & Gynecology

## 2020-05-28 ENCOUNTER — Ambulatory Visit: Payer: BC Managed Care – PPO | Admitting: Obstetrics & Gynecology

## 2020-05-28 VITALS — BP 113/74 | HR 78 | Ht 60.0 in | Wt 217.0 lb

## 2020-05-28 DIAGNOSIS — N83201 Unspecified ovarian cyst, right side: Secondary | ICD-10-CM

## 2020-05-28 DIAGNOSIS — R1031 Right lower quadrant pain: Secondary | ICD-10-CM | POA: Diagnosis not present

## 2020-05-28 MED ORDER — MEGESTROL ACETATE 40 MG PO TABS: ORAL_TABLET | ORAL | 3 refills | Status: DC

## 2020-05-28 NOTE — Progress Notes (Signed)
. Follow up appointment for results  Chief Complaint  Patient presents with  . Follow-up    Wants to discuss having ovaries removed    Blood pressure 113/74, pulse 78, height 5' (1.524 m), weight 217 lb (98.4 kg).    Narrative & Impression  CLINICAL DATA:  Right lower quadrant pain, prior hysterectomy in 2011  EXAM: [facility]IES  TECHNIQUE: Both transabdominal and transvaginal ultrasound examinations of the pelvis were performed. Transabdominal technique was performed for global imaging of the pelvis including uterus, ovaries, adnexal regions, and pelvic cul-de-sac.  It was necessary to proceed with endovaginal exam following the transabdominal exam to visualize the bilateral ovaries. Color and duplex Doppler ultrasound was utilized to evaluate blood flow to the ovaries.  COMPARISON:  Pelvic ultrasound 10/02/2019 (no report), CT 09/14/2019  FINDINGS: Uterus  Postsurgical changes from prior hysterectomy as seen previously. Few rounded anechoic cysts at the cervical cuff, likely nabothian cysts. No concerning masses.  Endometrium  Surgically absent  Right ovary  Measurements: 4.3 x 4.3 x 4.6 cm = volume: 44 mL. 3.8 x 3.0 x 3.4 cm predominantly anechoic cyst seen in the right ovary with a thin avascular internal septation. No other focal lesion.  Left ovary  Measurements: 2.8 x 2.1 x 1.4 cm = volume: 4.3 mL. Normal appearance. Lobular, thick-walled cystic lesion, likely involuting corpus luteum seen in the left ovary, benign physiologic finding. No concerning mass.  Pulsed Doppler evaluation of both ovaries demonstrates normal low-resistance arterial and venous waveforms.  Other findings  No abnormal free fluid.  IMPRESSION: 1. 3.8 cm predominantly anechoic cyst with a thin avascular internal septation in the right ovary. This has benign characteristics and is a  common finding in premenopausal females. While imaging follow-up is not typically recommended, given that pelvic pain is ipsilateral to this finding, follow-up ultrasound could be obtained in 6-8 weeks to assess for resolution. 2. No evidence of ovarian torsion. 3. Prior hysterectomy. Few anechoic nabothian cysts at the cervical cuff.   Electronically Signed   By: Lovena Le M.D.   On: 05/14/2020 22:14      MEDS ordered this encounter: Meds ordered this encounter  Medications  . megestrol (MEGACE) 40 MG tablet    Sig: 1 tablet daily    Dispense:  30 tablet    Refill:  3    Orders for this encounter: No orders of the defined types were placed in this encounter.   Impression:   ICD-10-CM   1. Right ovarian cyst  N83.201   2. RLQ abdominal pain  R10.31      Plan: Laparoscopic removal of right tube and ovary, she wants to preserve the left ovary  Follow Up: Return in about 24 days (around 06/21/2020) for Post Op, with Dr Elonda Husky, [place] visit.       Face to face time:  15 minutes  Greater than 50% of the visit time was spent in counseling and coordination of care with the patient.  The summary and outline of the counseling and care coordination is summarized in the note above.   All questions were answered.  Past Medical History:  Diagnosis Date  . Asthma   . Chronic knee pain   . Chronic rhinitis   . Hidradenitis suppurativa   . Hypertension   . IBS (irritable bowel syndrome)   . Migraine without aura 2009  . Multiple environmental allergies   . Other and unspecified ovarian cyst 02/06/2013  .  Ovarian cyst   . Papanicolaou smear of cervix with positive high risk human papilloma virus (HPV) test 07/20/2017  . Right leg weakness    chronic    Past Surgical History:  Procedure Laterality Date  . 2 cysts removed from arm    . ABDOMINAL HYSTERECTOMY    . BREAST REDUCTION SURGERY    . deviated septum repair    . hemroid surgery    .  LAPAROSCOPIC GASTRIC SLEEVE RESECTION      OB History    Gravida  0   Para  0   Term  0   Preterm  0   AB  0   Living  0     SAB  0   IAB  0   Ectopic  0   Multiple  0   Live Births  0           Allergies  Allergen Reactions  . Prednisone Other (See Comments)    Difficulty concentrating and functioning    Social History   Socioeconomic History  . Marital status: Single    Spouse name: Not on file  . Number of children: Not on file  . Years of education: Not on file  . Highest education level: Not on file  Occupational History  . Not on file  Tobacco Use  . Smoking status: Never Smoker  . Smokeless tobacco: Never Used  Vaping Use  . Vaping Use: Never used  Substance and Sexual Activity  . Alcohol use: No  . Drug use: No  . Sexual activity: Not Currently    Comment: hysterectomy [facility]  Other Topics Concern  . Not on file  Social History Narrative  . Not on file   Social Determinants of Health   Financial Resource Strain: Low Risk   . Difficulty of Paying Living Expenses: Not hard at all  Food Insecurity: No Food Insecurity  . Worried About Charity fundraiser in the Last Year: Never true  . Ran Out of Food in the Last Year: Never true  Transportation Needs: No Transportation Needs  . Lack of Transportation (Medical): No  . Lack of Transportation (Non-Medical): No  Physical Activity: Insufficiently Active  . Days of Exercise per Week: 1 day  . Minutes of Exercise per Session: 60 min  Stress: No Stress Concern Present  . Feeling of Stress : Only a little  Social Connections: Moderately Isolated  . Frequency of Communication with Friends and Family: More than three times a week  . Frequency of Social Gatherings with Friends and Family: Twice a week  . Attends Religious Services: More than 4 times per year  . Active Member of Clubs or Organizations: No  . Attends Archivist Meetings: Never  . Marital Status: Never married     Family History  Problem Relation Age of Onset  . Hypertension Mother   . Diabetes Mother   . Hyperlipidemia Mother   . Hypertension Father   . Hyperlipidemia Father   . Heart attack Father   . Sarcoidosis Sister   . Thyroid disease Sister   . Hyperlipidemia Sister   . Hypertension Sister   . Colon cancer Neg Hx   . Stomach cancer Neg Hx   . Pancreatic cancer Neg Hx   . Rectal cancer Neg Hx   . Esophageal cancer Neg Hx

## 2020-06-04 ENCOUNTER — Other Ambulatory Visit: Payer: Self-pay | Admitting: Podiatry

## 2020-06-05 ENCOUNTER — Encounter: Payer: BC Managed Care – PPO | Admitting: Podiatry

## 2020-06-05 ENCOUNTER — Other Ambulatory Visit: Payer: Self-pay | Admitting: Obstetrics & Gynecology

## 2020-06-05 NOTE — Patient Instructions (Signed)
Your procedure is scheduled on: 06/12/2020  Report to [place] Entrance at  11:30   AM.  Call this number if you have problems the morning of surgery: [phone]   Remember:   Do not Eat or Drink after midnight         No Smoking the morning of surgery  :  Take these medicines the morning of surgery with A SIP OF WATER: xanax, flonase Claritin and omeprazole  Do not wear jewelry, make-up or nail polish.  Do not wear lotions, powders, or perfumes. You may wear deodorant.  Do not shave 48 hours prior to surgery. Men may shave face and neck.  Do not bring valuables to the hospital.  Contacts, dentures or bridgework may not be worn into surgery.  Leave suitcase in the car. After surgery it may be brought to your room.  For patients admitted to the hospital, checkout time is 11:00 AM the day of discharge.   Patients discharged the day of surgery will not be allowed to drive home.    Special Instructions: Shower using CHG night before surgery and shower the day of surgery use CHG.  Use special wash - you have one bottle of CHG for all showers.  You should use approximately 1/2 of the bottle for each shower.  Unilateral Salpingo-Oophorectomy, Care After This sheet gives you information about how to care for yourself after your procedure. Your health care provider may also give you more specific instructions. If you have problems or questions, contact your health care provider. What can I expect after the procedure? After the procedure, it is common to have:  Abdominal pain.  Some occasional vaginal bleeding (spotting).  Tiredness. Follow these instructions at home: Incision care   Keep your incision area and your bandage (dressing) clean and dry.  Follow instructions from your health care provider about how to take care of your incision. Make sure you: ? Wash your hands with soap and water before you change your dressing. If soap and water are not available, use hand  sanitizer. ? Change your dressing as told by your health care provider. ? Leave stitches (sutures), staples, skin glue, or adhesive strips in place. These skin closures may need to stay in place for 2 weeks or longer. If adhesive strip edges start to loosen and curl up, you may trim the loose edges. Do not remove adhesive strips completely unless your health care provider tells you to do that.  Check your incision area every day for signs of infection. Check for: ? Redness, swelling, or pain. ? Fluid or blood. ? Warmth. ? Pus or a bad smell. Activity  Do not drive or use heavy machinery while taking prescription pain medicine.  Do not drive for 24 hours if you received a medicine to help you relax (sedative).  Take frequent, short walks throughout the day. Rest when you get tired. Ask your health care provider what activities are safe for you.  Avoid activities that require great effort. Also, avoid heavy lifting. Do not lift anything that is heavier than 5 lb (2.3 kg), or the limit that your health care provider tells you, until he or she says that it is safe to do so.  Do not douche, use tampons, or have sex until your health care provider approves. General instructions  To prevent or treat constipation while you are taking prescription pain medicine, your health care provider may recommend that you: ? Drink enough fluid to keep your urine  pale yellow. ? Take over-the-counter or prescription medicines. ? Eat foods that are high in fiber, such as fresh fruits and vegetables, whole grains, and beans. ? Limit foods that are high in fat and processed sugars, such as fried and sweet foods.  Take over-the-counter and prescription medicines only as told by your health care provider.  Do not take baths, swim, or use a hot tub until your health care provider approves. Ask your health care provider if you may take showers. You may only be allowed to take sponge baths.  Wear compression  stockings as told by your health care provider. These stockings help to prevent blood clots and reduce swelling in your legs.  Keep all follow-up visits as told by your health care provider. This is important. Contact a health care provider if:  You have pain when you urinate.  You have pus or a bad smelling discharge coming from your vagina.  You have redness, swelling, or pain around your incision.  You have fluid or blood coming from your incision.  Your incision feels warm to the touch.  You have pus or a bad smell coming from your incision.  You have a fever.  Your incision starts to break open.  You have abdominal pain that gets worse or does not get better with medicine.  You develop a rash.  You develop nausea and vomiting.  You feel lightheaded. Get help right away if:  You develop pain in your chest or leg.  You develop shortness of breath.  You faint.  You have increased bleeding from your vagina. Summary  After the procedure, it is common to have pain, tiredness, and occasional bleeding from the vagina.  Follow instructions from your health care provider about how to take care of your incision.  Check your incision every day for signs of infection and report any symptoms to your health care provider.  Follow instructions from your health care provider about activities and restrictions. This information is not intended to replace advice given to you by your health care provider. Make sure you discuss any questions you have with your health care provider. Document Revised: 05/14/2017 Document Reviewed: 09/10/2016 Elsevier Patient Education  [place].  How to Use Chlorhexidine for Bathing Chlorhexidine gluconate (CHG) is a germ-killing (antiseptic) solution that is used to clean the skin. It can get rid of the bacteria that normally live on the skin and can keep them away for about 24 hours. To clean your skin with CHG, you may be given:  A CHG  solution to use in the shower or as part of a sponge bath.  A prepackaged cloth that contains CHG. Cleaning your skin with CHG may help lower the risk for infection:  While you are staying in the intensive care unit of the hospital.  If you have a vascular access, such as a central line, to provide short-term or long-term access to your veins.  If you have a catheter to drain urine from your bladder.  If you are on a ventilator. A ventilator is a machine that helps you breathe by moving air in and out of your lungs.  After surgery. What are the risks? Risks of using CHG include:  A skin reaction.  Hearing loss, if CHG gets in your ears.  Eye injury, if CHG gets in your eyes and is not rinsed out.  The CHG product catching fire. Make sure that you avoid smoking and flames after applying CHG to your skin. Do  not use CHG:  If you have a chlorhexidine allergy or have previously reacted to chlorhexidine.  On babies younger than 73 months of age. How to use CHG solution  Use CHG only as told by your health care provider, and follow the instructions on the label.  Use the full amount of CHG as directed. Usually, this is one bottle. During a shower Follow these steps when using CHG solution during a shower (unless your health care provider gives you different instructions): 1. Start the shower. 2. Use your normal soap and shampoo to wash your face and hair. 3. Turn off the shower or move out of the shower stream. 4. Pour the CHG onto a clean washcloth. Do not use any type of brush or rough-edged sponge. 5. Starting at your neck, lather your body down to your toes. Make sure you follow these instructions: ? If you will be having surgery, pay special attention to the part of your body where you will be having surgery. Scrub this area for at least 1 minute. ? Do not use CHG on your head or face. If the solution gets into your ears or eyes, rinse them well with water. ? Avoid your  genital area. ? Avoid any areas of skin that have broken skin, cuts, or scrapes. ? Scrub your back and under your arms. Make sure to wash skin folds. 6. Let the lather sit on your skin for 1-2 minutes or as long as told by your health care provider. 7. Thoroughly rinse your entire body in the shower. Make sure that all body creases and crevices are rinsed well. 8. Dry off with a clean towel. Do not put any substances on your body afterward--such as powder, lotion, or perfume--unless you are told to do so by your health care provider. Only use lotions that are recommended by the manufacturer. 9. Put on clean clothes or pajamas. 10. If it is the night before your surgery, sleep in clean sheets.  During a sponge bath Follow these steps when using CHG solution during a sponge bath (unless your health care provider gives you different instructions): 1. Use your normal soap and shampoo to wash your face and hair. 2. Pour the CHG onto a clean washcloth. 3. Starting at your neck, lather your body down to your toes. Make sure you follow these instructions: ? If you will be having surgery, pay special attention to the part of your body where you will be having surgery. Scrub this area for at least 1 minute. ? Do not use CHG on your head or face. If the solution gets into your ears or eyes, rinse them well with water. ? Avoid your genital area. ? Avoid any areas of skin that have broken skin, cuts, or scrapes. ? Scrub your back and under your arms. Make sure to wash skin folds. 4. Let the lather sit on your skin for 1-2 minutes or as long as told by your health care provider. 5. Using a different clean, wet washcloth, thoroughly rinse your entire body. Make sure that all body creases and crevices are rinsed well. 6. Dry off with a clean towel. Do not put any substances on your body afterward--such as powder, lotion, or perfume--unless you are told to do so by your health care provider. Only use lotions  that are recommended by the manufacturer. 7. Put on clean clothes or pajamas. 8. If it is the night before your surgery, sleep in clean sheets. How to use CHG prepackaged cloths  Only use CHG cloths as told by your health care provider, and follow the instructions on the label.  Use the CHG cloth on clean, dry skin.  Do not use the CHG cloth on your head or face unless your health care provider tells you to.  When washing with the CHG cloth: ? Avoid your genital area. ? Avoid any areas of skin that have broken skin, cuts, or scrapes. Before surgery Follow these steps when using a CHG cloth to clean before surgery (unless your health care provider gives you different instructions): 1. Using the CHG cloth, vigorously scrub the part of your body where you will be having surgery. Scrub using a back-and-forth motion for 3 minutes. The area on your body should be completely wet with CHG when you are done scrubbing. 2. Do not rinse. Discard the cloth and let the area air-dry. Do not put any substances on the area afterward, such as powder, lotion, or perfume. 3. Put on clean clothes or pajamas. 4. If it is the night before your surgery, sleep in clean sheets.  For general bathing Follow these steps when using CHG cloths for general bathing (unless your health care provider gives you different instructions). 1. Use a separate CHG cloth for each area of your body. Make sure you wash between any folds of skin and between your fingers and toes. Wash your body in the following order, switching to a new cloth after each step: ? The front of your neck, shoulders, and chest. ? Both of your arms, under your arms, and your hands. ? Your stomach and groin area, avoiding the genitals. ? Your right leg and foot. ? Your left leg and foot. ? The back of your neck, your back, and your buttocks. 2. Do not rinse. Discard the cloth and let the area air-dry. Do not put any substances on your body afterward--such  as powder, lotion, or perfume--unless you are told to do so by your health care provider. Only use lotions that are recommended by the manufacturer. 3. Put on clean clothes or pajamas. Contact a health care provider if:  Your skin gets irritated after scrubbing.  You have questions about using your solution or cloth. Get help right away if:  Your eyes become very red or swollen.  Your eyes itch badly.  Your skin itches badly and is red or swollen.  Your hearing changes.  You have trouble seeing.  You have swelling or tingling in your mouth or throat.  You have trouble breathing.  You swallow any chlorhexidine. Summary  Chlorhexidine gluconate (CHG) is a germ-killing (antiseptic) solution that is used to clean the skin. Cleaning your skin with CHG may help to lower your risk for infection.  You may be given CHG to use for bathing. It may be in a bottle or in a prepackaged cloth to use on your skin. Carefully follow your health care provider's instructions and the instructions on the product label.  Do not use CHG if you have a chlorhexidine allergy.  Contact your health care provider if your skin gets irritated after scrubbing. This information is not intended to replace advice given to you by your health care provider. Make sure you discuss any questions you have with your health care provider. Document Revised: 08/18/2018 Document Reviewed: 04/29/2017 Elsevier Patient Education  2020 [place] Anesthesia, Adult, Care After This sheet gives you information about how to care for yourself after your procedure. Your health care provider may also give you  more specific instructions. If you have problems or questions, contact your health care provider. What can I expect after the procedure? After the procedure, the following side effects are common:  Pain or discomfort at the IV site.  Nausea.  Vomiting.  Sore throat.  Trouble concentrating.  Feeling cold  or chills.  Weak or tired.  Sleepiness and fatigue.  Soreness and body aches. These side effects can affect parts of the body that were not involved in surgery. Follow these instructions at home:  For at least 24 hours after the procedure:  Have a responsible adult stay with you. It is important to have someone help care for you until you are awake and alert.  Rest as needed.  Do not: ? Participate in activities in which you could fall or become injured. ? Drive. ? Use heavy machinery. ? Drink alcohol. ? Take sleeping pills or medicines that cause drowsiness. ? Make important decisions or sign legal documents. ? Take care of children on your own. Eating and drinking  Follow any instructions from your health care provider about eating or drinking restrictions.  When you feel hungry, start by eating small amounts of foods that are soft and easy to digest (bland), such as toast. Gradually return to your regular diet.  Drink enough fluid to keep your urine pale yellow.  If you vomit, rehydrate by drinking water, juice, or clear broth. General instructions  If you have sleep apnea, surgery and certain medicines can increase your risk for breathing problems. Follow instructions from your health care provider about wearing your sleep device: ? Anytime you are sleeping, including during daytime naps. ? While taking prescription pain medicines, sleeping medicines, or medicines that make you drowsy.  Return to your normal activities as told by your health care provider. Ask your health care provider what activities are safe for you.  Take over-the-counter and prescription medicines only as told by your health care provider.  If you smoke, do not smoke without supervision.  Keep all follow-up visits as told by your health care provider. This is important. Contact a health care provider if:  You have nausea or vomiting that does not get better with medicine.  You cannot eat or  drink without vomiting.  You have pain that does not get better with medicine.  You are unable to pass urine.  You develop a skin rash.  You have a fever.  You have redness around your IV site that gets worse. Get help right away if:  You have difficulty breathing.  You have chest pain.  You have blood in your urine or stool, or you vomit blood. Summary  After the procedure, it is common to have a sore throat or nausea. It is also common to feel tired.  Have a responsible adult stay with you for the first 24 hours after general anesthesia. It is important to have someone help care for you until you are awake and alert.  When you feel hungry, start by eating small amounts of foods that are soft and easy to digest (bland), such as toast. Gradually return to your regular diet.  Drink enough fluid to keep your urine pale yellow.  Return to your normal activities as told by your health care provider. Ask your health care provider what activities are safe for you. This information is not intended to replace advice given to you by your health care provider. Make sure you discuss any questions you have with your health care provider. Document  Revised: 06/04/2017 Document Reviewed: 01/15/2017 Elsevier Patient Education  [place].

## 2020-06-05 NOTE — Telephone Encounter (Signed)
Please advise 

## 2020-06-11 ENCOUNTER — Encounter (HOSPITAL_COMMUNITY)
Admission: RE | Admit: 2020-06-11 | Discharge: 2020-06-11 | Disposition: A | Discharge status: Home / Self Care | Payer: BC Managed Care – PPO | Source: Ambulatory Visit | Attending: Obstetrics & Gynecology | Admitting: Obstetrics & Gynecology

## 2020-06-11 ENCOUNTER — Other Ambulatory Visit: Payer: Self-pay

## 2020-06-11 ENCOUNTER — Encounter (HOSPITAL_COMMUNITY): Payer: Self-pay

## 2020-06-11 ENCOUNTER — Other Ambulatory Visit (HOSPITAL_COMMUNITY)
Admission: RE | Admit: 2020-06-11 | Discharge: 2020-06-11 | Disposition: A | Discharge status: Home / Self Care | Payer: BC Managed Care – PPO | Source: Ambulatory Visit | Attending: Obstetrics & Gynecology | Admitting: Obstetrics & Gynecology

## 2020-06-11 DIAGNOSIS — Z8249 Family history of ischemic heart disease and other diseases of the circulatory system: Secondary | ICD-10-CM | POA: Diagnosis not present

## 2020-06-11 DIAGNOSIS — N736 Female pelvic peritoneal adhesions (postinfective): Secondary | ICD-10-CM | POA: Diagnosis not present

## 2020-06-11 DIAGNOSIS — N83201 Unspecified ovarian cyst, right side: Secondary | ICD-10-CM | POA: Diagnosis not present

## 2020-06-11 DIAGNOSIS — Z01812 Encounter for preprocedural laboratory examination: Secondary | ICD-10-CM | POA: Insufficient documentation

## 2020-06-11 DIAGNOSIS — Z8349 Family history of other endocrine, nutritional and metabolic diseases: Secondary | ICD-10-CM | POA: Diagnosis not present

## 2020-06-11 DIAGNOSIS — Z6841 Body Mass Index (BMI) 40.0 and over, adult: Secondary | ICD-10-CM | POA: Diagnosis not present

## 2020-06-11 DIAGNOSIS — Z833 Family history of diabetes mellitus: Secondary | ICD-10-CM | POA: Diagnosis not present

## 2020-06-11 DIAGNOSIS — Z888 Allergy status to other drugs, medicaments and biological substances status: Secondary | ICD-10-CM | POA: Diagnosis not present

## 2020-06-11 DIAGNOSIS — R1031 Right lower quadrant pain: Secondary | ICD-10-CM | POA: Diagnosis not present

## 2020-06-11 DIAGNOSIS — Z90710 Acquired absence of both cervix and uterus: Secondary | ICD-10-CM | POA: Diagnosis not present

## 2020-06-11 DIAGNOSIS — Z20822 Contact with and (suspected) exposure to covid-19: Secondary | ICD-10-CM | POA: Insufficient documentation

## 2020-06-11 LAB — URINALYSIS, ROUTINE W REFLEX MICROSCOPIC
Bilirubin Urine: NEGATIVE
Glucose, UA: NEGATIVE mg/dL
Hgb urine dipstick: NEGATIVE
Ketones, ur: NEGATIVE mg/dL
Leukocytes,Ua: NEGATIVE
Nitrite: NEGATIVE
Protein, ur: NEGATIVE mg/dL
Specific Gravity, Urine: 1.016 (ref 1.005–1.030)
pH: 6 (ref 5.0–8.0)

## 2020-06-11 LAB — RAPID HIV SCREEN (HIV 1/2 AB+AG)
HIV 1/2 Antibodies: NONREACTIVE
HIV-1 P24 Antigen - HIV24: NONREACTIVE

## 2020-06-11 LAB — TYPE AND SCREEN
ABO/RH(D): A POS
Antibody Screen: NEGATIVE

## 2020-06-11 LAB — COMPREHENSIVE METABOLIC PANEL
ALT: 13 U/L (ref 0–44)
AST: 15 U/L (ref 15–41)
Albumin: 4.1 g/dL (ref 3.5–5.0)
Alkaline Phosphatase: 47 U/L (ref 38–126)
Anion gap: 8 (ref 5–15)
BUN: 9 mg/dL (ref 6–20)
CO2: 25 mmol/L (ref 22–32)
Calcium: 8.7 mg/dL — ABNORMAL LOW (ref 8.9–10.3)
Chloride: 103 mmol/L (ref 98–111)
Creatinine, Ser: 0.56 mg/dL (ref 0.44–1.00)
GFR, Estimated: >60 mL/min (ref >60)
Glucose, Bld: 115 mg/dL — ABNORMAL HIGH (ref 70–99)
Potassium: 3.5 mmol/L (ref 3.5–5.1)
Sodium: 136 mmol/L (ref 135–145)
Total Bilirubin: 0.8 mg/dL (ref 0.3–1.2)
Total Protein: 7.5 g/dL (ref 6.5–8.1)

## 2020-06-11 LAB — CBC
HCT: 41.2 % (ref 36.0–46.0)
Hemoglobin: 13.4 g/dL (ref 12.0–15.0)
MCH: 28.9 pg (ref 26.0–34.0)
MCHC: 32.5 g/dL (ref 30.0–36.0)
MCV: 88.8 fL (ref 80.0–100.0)
Platelets: 264 10*3/uL (ref 150–400)
RBC: 4.64 MIL/uL (ref 3.87–5.11)
RDW: 14.2 % (ref 11.5–15.5)
WBC: 6 10*3/uL (ref 4.0–10.5)
nRBC: 0 % (ref 0.0–0.2)

## 2020-06-12 ENCOUNTER — Ambulatory Visit (HOSPITAL_COMMUNITY)
Admission: RE | Admit: 2020-06-12 | Discharge: 2020-06-12 | Disposition: A | Discharge status: Home / Self Care | Payer: BC Managed Care – PPO | Attending: Obstetrics & Gynecology | Admitting: Obstetrics & Gynecology

## 2020-06-12 ENCOUNTER — Ambulatory Visit (HOSPITAL_COMMUNITY): Payer: BC Managed Care – PPO

## 2020-06-12 ENCOUNTER — Encounter (HOSPITAL_COMMUNITY): Payer: Self-pay | Admitting: Obstetrics & Gynecology

## 2020-06-12 ENCOUNTER — Encounter (HOSPITAL_COMMUNITY)
Admission: RE | Disposition: A | Discharge status: Home / Self Care | Payer: Self-pay | Source: Home / Self Care | Attending: Obstetrics & Gynecology

## 2020-06-12 DIAGNOSIS — Z833 Family history of diabetes mellitus: Secondary | ICD-10-CM | POA: Diagnosis not present

## 2020-06-12 DIAGNOSIS — Z20822 Contact with and (suspected) exposure to covid-19: Secondary | ICD-10-CM | POA: Insufficient documentation

## 2020-06-12 DIAGNOSIS — R1031 Right lower quadrant pain: Secondary | ICD-10-CM | POA: Diagnosis not present

## 2020-06-12 DIAGNOSIS — K660 Peritoneal adhesions (postprocedural) (postinfection): Secondary | ICD-10-CM | POA: Diagnosis not present

## 2020-06-12 DIAGNOSIS — Z8249 Family history of ischemic heart disease and other diseases of the circulatory system: Secondary | ICD-10-CM | POA: Insufficient documentation

## 2020-06-12 DIAGNOSIS — N736 Female pelvic peritoneal adhesions (postinfective): Secondary | ICD-10-CM | POA: Diagnosis not present

## 2020-06-12 DIAGNOSIS — Z6841 Body Mass Index (BMI) 40.0 and over, adult: Secondary | ICD-10-CM | POA: Insufficient documentation

## 2020-06-12 DIAGNOSIS — N8311 Corpus luteum cyst of right ovary: Secondary | ICD-10-CM | POA: Diagnosis not present

## 2020-06-12 DIAGNOSIS — Z8349 Family history of other endocrine, nutritional and metabolic diseases: Secondary | ICD-10-CM | POA: Insufficient documentation

## 2020-06-12 DIAGNOSIS — Z90710 Acquired absence of both cervix and uterus: Secondary | ICD-10-CM | POA: Diagnosis not present

## 2020-06-12 DIAGNOSIS — J45909 Unspecified asthma, uncomplicated: Secondary | ICD-10-CM | POA: Diagnosis not present

## 2020-06-12 DIAGNOSIS — Z888 Allergy status to other drugs, medicaments and biological substances status: Secondary | ICD-10-CM | POA: Diagnosis not present

## 2020-06-12 DIAGNOSIS — N8301 Follicular cyst of right ovary: Secondary | ICD-10-CM | POA: Diagnosis not present

## 2020-06-12 DIAGNOSIS — N83201 Unspecified ovarian cyst, right side: Secondary | ICD-10-CM | POA: Insufficient documentation

## 2020-06-12 DIAGNOSIS — I10 Essential (primary) hypertension: Secondary | ICD-10-CM | POA: Diagnosis not present

## 2020-06-12 HISTORY — PX: LAPAROSCOPIC UNILATERAL SALPINGO OOPHERECTOMY: SHX5935

## 2020-06-12 LAB — SARS CORONAVIRUS 2 (TAT 6-24 HRS): SARS Coronavirus 2: NEGATIVE

## 2020-06-12 SURGERY — SALPINGO-OOPHORECTOMY, UNILATERAL, LAPAROSCOPIC
Anesthesia: General | Laterality: Right

## 2020-06-12 MED ORDER — FENTANYL CITRATE (PF) 100 MCG/2ML IJ SOLN
25.0000 ug | INTRAMUSCULAR | Status: DC | PRN
  Administered 2020-06-12: 25 ug via INTRAVENOUS
  Filled 2020-06-12: qty 2

## 2020-06-12 MED ORDER — ROCURONIUM BROMIDE 10 MG/ML (PF) SYRINGE
PREFILLED_SYRINGE | INTRAVENOUS | Status: DC | PRN
  Administered 2020-06-12: 5 mg via INTRAVENOUS
  Administered 2020-06-12: 50 mg via INTRAVENOUS
  Administered 2020-06-12: 10 mg via INTRAVENOUS

## 2020-06-12 MED ORDER — ROCURONIUM BROMIDE 10 MG/ML (PF) SYRINGE
PREFILLED_SYRINGE | INTRAVENOUS | Status: AC
  Filled 2020-06-12: qty 10

## 2020-06-12 MED ORDER — PROPOFOL 10 MG/ML IV BOLUS
INTRAVENOUS | Status: DC | PRN
  Administered 2020-06-12: 200 mg via INTRAVENOUS

## 2020-06-12 MED ORDER — BUPIVACAINE LIPOSOME 1.3 % IJ SUSP
20.0000 mL | Freq: Once | INTRAMUSCULAR | Status: DC
  Filled 2020-06-12: qty 20

## 2020-06-12 MED ORDER — LIDOCAINE HCL (PF) 2 % IJ SOLN
INTRAMUSCULAR | Status: AC
  Filled 2020-06-12: qty 5

## 2020-06-12 MED ORDER — EPHEDRINE 5 MG/ML INJ
INTRAVENOUS | Status: AC
  Filled 2020-06-12: qty 10

## 2020-06-12 MED ORDER — BUPIVACAINE LIPOSOME 1.3 % IJ SUSP
INTRAMUSCULAR | Status: AC
  Filled 2020-06-12: qty 20

## 2020-06-12 MED ORDER — KETOROLAC TROMETHAMINE 30 MG/ML IJ SOLN
30.0000 mg | Freq: Once | INTRAMUSCULAR | Status: AC
  Administered 2020-06-12: 30 mg via INTRAVENOUS

## 2020-06-12 MED ORDER — ONDANSETRON 8 MG PO TBDP: 8.0000 mg | ORAL_TABLET | Freq: Three times a day (TID) | ORAL | 0 refills | Status: DC | PRN

## 2020-06-12 MED ORDER — OXYCODONE HCL 5 MG/5ML PO SOLN: 5.0000 mg | Freq: Once | ORAL | Status: AC | PRN

## 2020-06-12 MED ORDER — FENTANYL CITRATE (PF) 250 MCG/5ML IJ SOLN
INTRAMUSCULAR | Status: AC
  Filled 2020-06-12: qty 5

## 2020-06-12 MED ORDER — OXYCODONE HCL 5 MG PO TABS
5.0000 mg | ORAL_TABLET | Freq: Once | ORAL | Status: AC | PRN
  Administered 2020-06-12: 5 mg via ORAL
  Filled 2020-06-12: qty 1

## 2020-06-12 MED ORDER — ONDANSETRON HCL 4 MG/2ML IJ SOLN: 4.0000 mg | Freq: Once | INTRAMUSCULAR | Status: DC | PRN

## 2020-06-12 MED ORDER — ONDANSETRON HCL 4 MG/2ML IJ SOLN
INTRAMUSCULAR | Status: AC
  Filled 2020-06-12: qty 2

## 2020-06-12 MED ORDER — 0.9 % SODIUM CHLORIDE (POUR BTL) OPTIME
TOPICAL | Status: DC | PRN
  Administered 2020-06-12: 1000 mL

## 2020-06-12 MED ORDER — LIDOCAINE HCL (CARDIAC) PF 100 MG/5ML IV SOSY
PREFILLED_SYRINGE | INTRAVENOUS | Status: DC | PRN
  Administered 2020-06-12: 50 mg via INTRATRACHEAL

## 2020-06-12 MED ORDER — CEFAZOLIN SODIUM-DEXTROSE 2-4 GM/100ML-% IV SOLN
INTRAVENOUS | Status: AC
  Filled 2020-06-12: qty 100

## 2020-06-12 MED ORDER — CEFAZOLIN SODIUM-DEXTROSE 2-4 GM/100ML-% IV SOLN
2.0000 g | INTRAVENOUS | Status: AC
  Administered 2020-06-12: 2 g via INTRAVENOUS

## 2020-06-12 MED ORDER — MIDAZOLAM HCL 2 MG/2ML IJ SOLN
INTRAMUSCULAR | Status: DC | PRN
  Administered 2020-06-12: 2 mg via INTRAVENOUS

## 2020-06-12 MED ORDER — FENTANYL CITRATE (PF) 100 MCG/2ML IJ SOLN
INTRAMUSCULAR | Status: DC | PRN
  Administered 2020-06-12: 50 ug via INTRAVENOUS
  Administered 2020-06-12: 100 ug via INTRAVENOUS
  Administered 2020-06-12 (×4): 50 ug via INTRAVENOUS

## 2020-06-12 MED ORDER — ALBUTEROL SULFATE HFA 108 (90 BASE) MCG/ACT IN AERS
INHALATION_SPRAY | RESPIRATORY_TRACT | Status: DC | PRN
  Administered 2020-06-12: 4 via RESPIRATORY_TRACT

## 2020-06-12 MED ORDER — KETOROLAC TROMETHAMINE 10 MG PO TABS: 10.0000 mg | ORAL_TABLET | Freq: Three times a day (TID) | ORAL | 0 refills | Status: DC | PRN

## 2020-06-12 MED ORDER — ONDANSETRON HCL 4 MG/2ML IJ SOLN
INTRAMUSCULAR | Status: DC | PRN
  Administered 2020-06-12: 4 mg via INTRAVENOUS

## 2020-06-12 MED ORDER — PROPOFOL 10 MG/ML IV BOLUS
INTRAVENOUS | Status: AC
  Filled 2020-06-12: qty 40

## 2020-06-12 MED ORDER — FENTANYL CITRATE (PF) 100 MCG/2ML IJ SOLN
INTRAMUSCULAR | Status: AC
  Filled 2020-06-12: qty 2

## 2020-06-12 MED ORDER — POVIDONE-IODINE 10 % EX SWAB: 2.0000 "application " | Freq: Once | CUTANEOUS | Status: DC

## 2020-06-12 MED ORDER — BUPIVACAINE LIPOSOME 1.3 % IJ SUSP
INTRAMUSCULAR | Status: DC | PRN
  Administered 2020-06-12: 20 mL

## 2020-06-12 MED ORDER — KETOROLAC TROMETHAMINE 30 MG/ML IJ SOLN
INTRAMUSCULAR | Status: AC
  Filled 2020-06-12: qty 1

## 2020-06-12 MED ORDER — DEXAMETHASONE SODIUM PHOSPHATE 10 MG/ML IJ SOLN
INTRAMUSCULAR | Status: AC
  Filled 2020-06-12: qty 1

## 2020-06-12 MED ORDER — LACTATED RINGERS IV SOLN: INTRAVENOUS | Status: DC | PRN

## 2020-06-12 MED ORDER — SUGAMMADEX SODIUM 500 MG/5ML IV SOLN
INTRAVENOUS | Status: DC | PRN
  Administered 2020-06-12: 200 mg via INTRAVENOUS

## 2020-06-12 MED ORDER — ALBUTEROL SULFATE HFA 108 (90 BASE) MCG/ACT IN AERS
INHALATION_SPRAY | RESPIRATORY_TRACT | Status: AC
  Filled 2020-06-12: qty 6.7

## 2020-06-12 MED ORDER — MIDAZOLAM HCL 2 MG/2ML IJ SOLN
INTRAMUSCULAR | Status: AC
  Filled 2020-06-12: qty 2

## 2020-06-12 MED ORDER — OXYCODONE-ACETAMINOPHEN 5-325 MG PO TABS: 1.0000 | ORAL_TABLET | ORAL | 0 refills | Status: DC | PRN

## 2020-06-12 SURGICAL SUPPLY — 49 items
ADH SKN CLS APL DERMABOND .7 (GAUZE/BANDAGES/DRESSINGS) ×1
APPLIER CLIP ROT 10 11.4 M/L (STAPLE)
APR CLP MED LRG 11.4X10 (STAPLE)
BAG HAMPER (MISCELLANEOUS) ×2 IMPLANT
BAG RETRIEVAL 10 (BASKET) ×1
BLADE SURG SZ11 CARB STEEL (BLADE) ×2 IMPLANT
CLIP APPLIE ROT 10 11.4 M/L (STAPLE) IMPLANT
CLOTH BEACON ORANGE TIMEOUT ST (SAFETY) ×2 IMPLANT
COVER LIGHT HANDLE STERIS (MISCELLANEOUS) ×4 IMPLANT
COVER WAND RF STERILE (DRAPES) ×2 IMPLANT
DERMABOND ADVANCED (GAUZE/BANDAGES/DRESSINGS) ×1
DERMABOND ADVANCED .7 DNX12 (GAUZE/BANDAGES/DRESSINGS) IMPLANT
ELECT REM PT RETURN 9FT ADLT (ELECTROSURGICAL) ×2
ELECTRODE REM PT RTRN 9FT ADLT (ELECTROSURGICAL) ×1 IMPLANT
GAUZE 4X4 16PLY RFD (DISPOSABLE) ×2 IMPLANT
GLOVE BIOGEL PI IND STRL 7.0 (GLOVE) ×2 IMPLANT
GLOVE BIOGEL PI IND STRL 8 (GLOVE) ×1 IMPLANT
GLOVE BIOGEL PI INDICATOR 7.0 (GLOVE) ×4
GLOVE BIOGEL PI INDICATOR 8 (GLOVE) ×1
GLOVE ECLIPSE 6.5 STRL STRAW (GLOVE) ×1 IMPLANT
GLOVE ECLIPSE 8.0 STRL XLNG CF (GLOVE) ×2 IMPLANT
GOWN STRL REUS W/TWL LRG LVL3 (GOWN DISPOSABLE) ×3 IMPLANT
GOWN STRL REUS W/TWL XL LVL3 (GOWN DISPOSABLE) ×2 IMPLANT
INST SET LAPROSCOPIC GYN AP (KITS) ×2 IMPLANT
IV NS IRRIG 3000ML ARTHROMATIC (IV SOLUTION) IMPLANT
KIT TURNOVER KIT A (KITS) ×2 IMPLANT
MANIFOLD NEPTUNE II (INSTRUMENTS) ×2 IMPLANT
NDL HYPO 18GX1.5 BLUNT FILL (NEEDLE) ×1 IMPLANT
NEEDLE 22X1 1/2 (OR ONLY) (NEEDLE) ×2 IMPLANT
NEEDLE HYPO 18GX1.5 BLUNT FILL (NEEDLE) ×2 IMPLANT
NEEDLE INSUFFLATION 120MM (ENDOMECHANICALS) ×2 IMPLANT
PACK PERI GYN (CUSTOM PROCEDURE TRAY) ×2 IMPLANT
PAD ARMBOARD 7.5X6 YLW CONV (MISCELLANEOUS) ×2 IMPLANT
SET BASIN LINEN APH (SET/KITS/TRAYS/PACK) ×2 IMPLANT
SET TUBE IRRIG SUCTION NO TIP (IRRIGATION / IRRIGATOR) IMPLANT
SET TUBE SMOKE EVAC HIGH FLOW (TUBING) IMPLANT
SHEARS HARMONIC ACE PLUS 36CM (ENDOMECHANICALS) ×2 IMPLANT
SLEEVE XCEL OPT CAN 5 100 (ENDOMECHANICALS) ×2 IMPLANT
SOL ANTI FOG 6CC (MISCELLANEOUS) ×1 IMPLANT
SOLUTION ANTI FOG 6CC (MISCELLANEOUS) ×1
SUT VICRYL 0 UR6 27IN ABS (SUTURE) ×2 IMPLANT
SYR 20ML LL LF (SYRINGE) ×4 IMPLANT
SYS BAG RETRIEVAL 10MM (BASKET) ×1
SYSTEM BAG RETRIEVAL 10MM (BASKET) IMPLANT
TRAY FOLEY W/BAG SLVR 16FR (SET/KITS/TRAYS/PACK)
TRAY FOLEY W/BAG SLVR 16FR ST (SET/KITS/TRAYS/PACK) IMPLANT
TROCAR XCEL NON-BLD 11X100MML (ENDOMECHANICALS) ×2 IMPLANT
TROCAR XCEL NON-BLD 5MMX100MML (ENDOMECHANICALS) ×2 IMPLANT
WARMER LAPAROSCOPE (MISCELLANEOUS) ×2 IMPLANT

## 2020-06-12 NOTE — Op Note (Signed)
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 to the pelvic sidewall  Procedure: Laparoscopic right salpingo-oophorectomy with extensive lysis of pelvic abdominal adhesions  Surgeon:Pooja Camuso Chriss Driver, MD  Anesthesia: General endotracheal  Findings: Patient had omentum essentially adherent to the entire abdomen anterior abdominal wall in the lower abdomen There were loops of small bowel which were also tied up in the omentum and adherent to the abdominal wall The right tube and ovary were densely adherent to the pelvic sidewall Left ovary and tube was normal  Description of operation: Patient was taken to the operating room where she was placed in the supine position and underwent general endotracheal anesthesia She was placed in low lithotomy position for laparoscopic surgery She was prepped and draped in usual sterile fashion for laparoscopic surgery And incision was made in the umbilicus carried down sharply to the rectus fascia The rectus fascia was grasped with a single-tooth tenaculum A Veres needle was used and placed into the peritoneal cavity with 1 pass light difficulty The peritoneal cavity was insufflated An 11 mm nonbladed direct visualization trocar was used and the video laparoscope was placed into the peritoneal cavity with 1 pass light difficulty The extensive adhesive disease was noted I made an incision and a nonadhesion window in the left lower quadrant and placed a 5 mm trocar under direct visualization I then used the harmonic scalpel and took down adhesions of the omentum to the anterior abdominal wall in order to facilitate placement of a right lower quadrant trocar An incision was made a safe window in the right lower quadrant and a 5 mm trocar was placed into the peritoneal cavity with 1 pass light  difficulty under direct visualization I was able to isolate the right tube and ovary and its blood supply I had the take probably a good 30 minutes of taken down adhesions of the small bowel and omentum to the anterior abdominal wall the right ovary and tube in the pelvic sidewall in order to remove the tube and ovary The harmonic scalpel was used and the infundibulopelvic ligament was transected hemostatically The ovary and tube were adherent to the pelvic sidewall and this was taken down with traction and the harmonic scalpel hemostatically I viewed all of the small bowel areas and the pelvic sidewall and there were no injuries noted The pedicle was also hemostatic A 5 mm scope was used and the Endo Catch bag was used to retrieve the right ovary and tube and removed through the umbilical incision without difficulty Again all the pedicles and the surgical areas of the omentum anterior abdominal wall small bowel were identified and found to be uninjured The trochars were removed from the peritoneal cavity and gas was allowed to escape The umbilical fascia was closed with 2 interrupted 2-0 Vicryl sutures The subcutaneous tissue was reapproximated in the umbilicus as well All 3 incisions were closed in a subcuticular fashion Dermabond was placed over each incision provide a postoperative barrier 10 cc of Exparel was injected into the umbilical incision and 5 cc was injected in each one of the lower quadrant incisions for postoperative pain management  Patient tolerated the procedure well she experienced approximately 25 cc of blood loss for the surgery She received Ancef and Toradol preoperatively prophylactically  She was awakened from anesthesia taken recovery in good stable condition all counts were correct x3  Was to be seen back in the office next week for routine postoperative visit  Lazaro Arms, MD 06/12/2020 2:21 PM

## 2020-06-12 NOTE — Discharge Instructions (Signed)
Unilateral Salpingo-Oophorectomy, Care After This sheet gives you information about how to care for yourself after your procedure. Your health care provider may also give you more specific instructions. If you have problems or questions, contact your health care provider. What can I expect after the procedure? After the procedure, it is common to have:  Abdominal pain.  Some occasional vaginal bleeding (spotting).  Tiredness. Follow these instructions at home: Incision care   Keep your incision area and your bandage (dressing) clean and dry.  Follow instructions from your health care provider about how to take care of your incision. Make sure you: ? Wash your hands with soap and water before you change your dressing. If soap and water are not available, use hand sanitizer. ? Change your dressing as told by your health care provider. ? Leave stitches (sutures), staples, skin glue, or adhesive strips in place. These skin closures may need to stay in place for 2 weeks or longer. If adhesive strip edges start to loosen and curl up, you may trim the loose edges. Do not remove adhesive strips completely unless your health care provider tells you to do that.  Check your incision area every day for signs of infection. Check for: ? Redness, swelling, or pain. ? Fluid or blood. ? Warmth. ? Pus or a bad smell. Activity  Do not drive or use heavy machinery while taking prescription pain medicine.  Do not drive for 24 hours if you received a medicine to help you relax (sedative).  Take frequent, short walks throughout the day. Rest when you get tired. Ask your health care provider what activities are safe for you.  Avoid activities that require great effort. Also, avoid heavy lifting. Do not lift anything that is heavier than 5 lb (2.3 kg), or the limit that your health care provider tells you, until he or she says that it is safe to do so.  Do not douche, use tampons, or have sex until your  health care provider approves. General instructions  To prevent or treat constipation while you are taking prescription pain medicine, your health care provider may recommend that you: ? Drink enough fluid to keep your urine pale yellow. ? Take over-the-counter or prescription medicines. ? Eat foods that are high in fiber, such as fresh fruits and vegetables, whole grains, and beans. ? Limit foods that are high in fat and processed sugars, such as fried and sweet foods.  Take over-the-counter and prescription medicines only as told by your health care provider.  Do not take baths, swim, or use a hot tub until your health care provider approves. Ask your health care provider if you may take showers. You may only be allowed to take sponge baths.  Wear compression stockings as told by your health care provider. These stockings help to prevent blood clots and reduce swelling in your legs.  Keep all follow-up visits as told by your health care provider. This is important. Contact a health care provider if:  You have pain when you urinate.  You have pus or a bad smelling discharge coming from your vagina.  You have redness, swelling, or pain around your incision.  You have fluid or blood coming from your incision.  Your incision feels warm to the touch.  You have pus or a bad smell coming from your incision.  You have a fever.  Your incision starts to break open.  You have abdominal pain that gets worse or does not get better with medicine.    You develop a rash.  You develop nausea and vomiting.  You feel lightheaded. Get help right away if:  You develop pain in your chest or leg.  You develop shortness of breath.  You faint.  You have increased bleeding from your vagina. Summary  After the procedure, it is common to have pain, tiredness, and occasional bleeding from the vagina.  Follow instructions from your health care provider about how to take care of your  incision.  Check your incision every day for signs of infection and report any symptoms to your health care provider.  Follow instructions from your health care provider about activities and restrictions. This information is not intended to replace advice given to you by your health care provider. Make sure you discuss any questions you have with your health care provider. Document Revised: 05/14/2017 Document Reviewed: 09/10/2016 Elsevier Patient Education  [place].    Ketorolac tablets What is this medicine? KETOROLAC (kee toe ROLE ak) is a non-steroidal anti-inflammatory drug (NSAID). It is used for a short while to treat moderate to severe pain, including pain after surgery. It should not be used for more than 5 days. This medicine may be used for other purposes; ask your health care provider or pharmacist if you have questions. COMMON BRAND NAME(S): Toradol What should I tell my health care provider before I take this medicine? They need to know if you have any of these conditions:  bleeding disorders  cigarette smoker  coronary artery bypass graft (CABG) surgery within the past 2 weeks  drink more than 3 alcohol-containing drinks a day  heart disease  high blood pressure  history of stomach bleeding  kidney disease  liver disease  lung or breathing disease, like asthma  stomach or intestine problems  an unusual or allergic reaction to ketorolac, aspirin, other NSAIDs, other medicines, foods, dyes, or preservatives  pregnant or trying to get pregnant  breast-feeding How should I use this medicine? Take this medicine by mouth with a full glass of water. Follow the directions on the prescription label. Take your medicine at regular intervals. Do not take your medicine more often than directed. Do not take more than the recommended dose. A special MedGuide will be given to you by the pharmacist with each prescription and refill. Be sure to read this  information carefully each time. Talk to your pediatrician regarding the use of this medicine in children. While this drug may be prescribed for children as young as 79 years of age for selected conditions, precautions do apply. Patients over 43 years old may have a stronger reaction and need a smaller dose. Overdosage: If you think you have taken too much of this medicine contact a poison control center or emergency room at once. NOTE: This medicine is only for you. Do not share this medicine with others. What if I miss a dose? If you miss a dose, take it as soon as you can. If it is almost time for your next dose, take only that dose. Do not take double or extra doses. What may interact with this medicine? Do not take this medicine with any of the following medications:  aspirin and aspirin-like medicines  cidofovir  methotrexate  NSAIDs, medicines for pain and inflammation, like ibuprofen or naproxen  pemetrexed  probenecid This medicine may also interact with the following medications:  alcohol  alendronate  alprazolam  carbamazepine  cyclosporine  diuretics  flavocoxid  fluoxetine  ginkgo  lithium  medicines for high blood  pressure like enalapril  medicines that affect platelets like pentoxifylline  medicines that treat or prevent blood clots like heparin, warfarin  muscle relaxants  phenytoin  steroid medicines like prednisone or cortisone  thiothixene This list may not describe all possible interactions. Give your health care provider a list of all the medicines, herbs, non-prescription drugs, or dietary supplements you use. Also tell them if you smoke, drink alcohol, or use illegal drugs. Some items may interact with your medicine. What should I watch for while using this medicine? Tell your doctor or healthcare provider if your pain does not get better. Talk to your doctor before taking another medicine for pain. Do not treat yourself. This medicine  may cause serious skin reactions. They can happen weeks to months after starting the medicine. Contact your healthcare provider right away if you notice fevers or flu-like symptoms with a rash. The rash may be red or purple and then turn into blisters or peeling of the skin. Or, you might notice a red rash with swelling of the face, lips or lymph nodes in your neck or under your arms. This medicine does not prevent heart attack or stroke. In fact, this medicine may increase the chance of a heart attack or stroke. The chance may increase with longer use of this medicine and in people who have heart disease. If you take aspirin to prevent heart attack or stroke, talk with your doctor or healthcare provider. Do not take medicines such as ibuprofen and naproxen with this medicine. Side effects such as stomach upset, nausea, or ulcers may be more likely to occur. Many medicines available without a prescription should not be taken with this medicine. This medicine can cause ulcers and bleeding in the stomach and intestines at any time during treatment. Do not smoke cigarettes or drink alcohol. These increase irritation to your stomach and can make it more susceptible to damage from this medicine. Ulcers and bleeding can happen without warning symptoms and can cause death. You may get drowsy or dizzy. Do not drive, use machinery, or do anything that needs mental alertness until you know how this medicine affects you. Do not stand or sit up quickly, especially if you are an older patient. This reduces the risk of dizzy or fainting spells. This medicine can cause you to bleed more easily. Try to avoid damage to your teeth and gums when you brush or floss your teeth. What side effects may I notice from receiving this medicine? Side effects that you should report to your doctor or health care professional as soon as possible:  allergic reactions like skin rash, itching or hives, swelling of the face, lips, or  tongue  breathing problems  high blood pressure  nausea, vomiting  redness, blistering, peeling or loosening of the skin, including inside the mouth  severe stomach pain  signs and symptoms of bleeding such as bloody or black, tarry stools; red or dark-brown urine; spitting up blood or brown material that looks like coffee grounds; red spots on the skin; unusual bruising or bleeding from the eye, gums, or nose  signs and symptoms of a stroke like changes in vision; confusion; trouble speaking or understanding; severe headaches; sudden numbness or weakness of the face, arm or leg; trouble walking; dizziness; loss of balance or coordination  trouble passing urine or change in the amount of urine  unexplained weight gain or swelling  unusually weak or tired  yellowing of eyes or skin Side effects that usually do not require  medical attention (report to your doctor or health care professional if they continue or are bothersome):  diarrhea  dizziness  headache  heartburn This list may not describe all possible side effects. Call your doctor for medical advice about side effects. You may report side effects to FDA at 1-800-FDA-1088. Where should I keep my medicine? Keep out of the reach of children. Store at room temperature between 20 and 25 degrees C (68 and 77 degrees F). Throw away any unused medicine after the expiration date. NOTE: This sheet is a summary. It may not cover all possible information. If you have questions about this medicine, talk to your doctor, pharmacist, or health care provider.  2020 Elsevier/Gold Standard (2018-08-17 15:15:50)    General Anesthesia, Adult, Care After This sheet gives you information about how to care for yourself after your procedure. Your health care provider may also give you more specific instructions. If you have problems or questions, contact your health care provider. What can I expect after the procedure? After the procedure,  the following side effects are common:  Pain or discomfort at the IV site.  Nausea.  Vomiting.  Sore throat.  Trouble concentrating.  Feeling cold or chills.  Weak or tired.  Sleepiness and fatigue.  Soreness and body aches. These side effects can affect parts of the body that were not involved in surgery. Follow these instructions at home:  For at least 24 hours after the procedure:  Have a responsible adult stay with you. It is important to have someone help care for you until you are awake and alert.  Rest as needed.  Do not: ? Participate in activities in which you could fall or become injured. ? Drive. ? Use heavy machinery. ? Drink alcohol. ? Take sleeping pills or medicines that cause drowsiness. ? Make important decisions or sign legal documents. ? Take care of children on your own. Eating and drinking  Follow any instructions from your health care provider about eating or drinking restrictions.  When you feel hungry, start by eating small amounts of foods that are soft and easy to digest (bland), such as toast. Gradually return to your regular diet.  Drink enough fluid to keep your urine pale yellow.  If you vomit, rehydrate by drinking water, juice, or clear broth. General instructions  If you have sleep apnea, surgery and certain medicines can increase your risk for breathing problems. Follow instructions from your health care provider about wearing your sleep device: ? Anytime you are sleeping, including during daytime naps. ? While taking prescription pain medicines, sleeping medicines, or medicines that make you drowsy.  Return to your normal activities as told by your health care provider. Ask your health care provider what activities are safe for you.  Take over-the-counter and prescription medicines only as told by your health care provider.  If you smoke, do not smoke without supervision.  Keep all follow-up visits as told by your health care  provider. This is important. Contact a health care provider if:  You have nausea or vomiting that does not get better with medicine.  You cannot eat or drink without vomiting.  You have pain that does not get better with medicine.  You are unable to pass urine.  You develop a skin rash.  You have a fever.  You have redness around your IV site that gets worse. Get help right away if:  You have difficulty breathing.  You have chest pain.  You have blood in your urine  or stool, or you vomit blood. Summary  After the procedure, it is common to have a sore throat or nausea. It is also common to feel tired.  Have a responsible adult stay with you for the first 24 hours after general anesthesia. It is important to have someone help care for you until you are awake and alert.  When you feel hungry, start by eating small amounts of foods that are soft and easy to digest (bland), such as toast. Gradually return to your regular diet.  Drink enough fluid to keep your urine pale yellow.  Return to your normal activities as told by your health care provider. Ask your health care provider what activities are safe for you. This information is not intended to replace advice given to you by your health care provider. Make sure you discuss any questions you have with your health care provider. Document Revised: 06/04/2017 Document Reviewed: 01/15/2017 Elsevier Patient Education  [place].   Ondansetron oral dissolving tablet What is this medicine? ONDANSETRON (on DAN se tron) is used to treat nausea and vomiting caused by chemotherapy. It is also used to prevent or treat nausea and vomiting after surgery. This medicine may be used for other purposes; ask your health care provider or pharmacist if you have questions. COMMON BRAND NAME(S): Zofran ODT What should I tell my health care provider before I take this medicine? They need to know if you have any of these  conditions:  heart disease  history of irregular heartbeat  liver disease  low levels of magnesium or potassium in the blood  an unusual or allergic reaction to ondansetron, granisetron, other medicines, foods, dyes, or preservatives  pregnant or trying to get pregnant  breast-feeding How should I use this medicine? These tablets are made to dissolve in the mouth. Do not try to push the tablet through the foil backing. With dry hands, peel away the foil backing and gently remove the tablet. Place the tablet in the mouth and allow it to dissolve, then swallow. While you may take these tablets with water, it is not necessary to do so. Talk to your pediatrician regarding the use of this medicine in children. Special care may be needed. Overdosage: If you think you have taken too much of this medicine contact a poison control center or emergency room at once. NOTE: This medicine is only for you. Do not share this medicine with others. What if I miss a dose? If you miss a dose, take it as soon as you can. If it is almost time for your next dose, take only that dose. Do not take double or extra doses. What may interact with this medicine? Do not take this medicine with any of the following medications:  apomorphine  certain medicines for fungal infections like fluconazole, itraconazole, ketoconazole, posaconazole, voriconazole  cisapride  dronedarone  pimozide  thioridazine This medicine may also interact with the following medications:  carbamazepine  certain medicines for depression, anxiety, or psychotic disturbances  fentanyl  linezolid  MAOIs like Carbex, Eldepryl, Marplan, Nardil, and Parnate  methylene blue (injected into a vein)  other medicines that prolong the QT interval (cause an abnormal heart rhythm) like dofetilide, ziprasidone  phenytoin  rifampicin  tramadol This list may not describe all possible interactions. Give your health care provider a list  of all the medicines, herbs, non-prescription drugs, or dietary supplements you use. Also tell them if you smoke, drink alcohol, or use illegal drugs. Some items may interact  with your medicine. What should I watch for while using this medicine? Check with your doctor or health care professional as soon as you can if you have any sign of an allergic reaction. What side effects may I notice from receiving this medicine? Side effects that you should report to your doctor or health care professional as soon as possible:  allergic reactions like skin rash, itching or hives, swelling of the face, lips, or tongue  breathing problems  confusion  dizziness  fast or irregular heartbeat  feeling faint or lightheaded, falls  fever and chills  loss of balance or coordination  seizures  sweating  swelling of the hands and feet  tightness in the chest  tremors  unusually weak or tired Side effects that usually do not require medical attention (report to your doctor or health care professional if they continue or are bothersome):  constipation or diarrhea  headache This list may not describe all possible side effects. Call your doctor for medical advice about side effects. You may report side effects to FDA at 1-800-FDA-1088. Where should I keep my medicine? Keep out of the reach of children. Store between 2 and 30 degrees C (36 and 86 degrees F). Throw away any unused medicine after the expiration date. NOTE: This sheet is a summary. It may not cover all possible information. If you have questions about this medicine, talk to your doctor, pharmacist, or health care provider.  2020 Elsevier/Gold Standard (2018-05-24 07:14:10)   Acetaminophen; Oxycodone tablets What is this medicine? ACETAMINOPHEN; OXYCODONE (a set a MEE noe fen; ox i KOE done) is a pain reliever. It is used to treat moderate to severe pain. This medicine may be used for other purposes; ask your health care provider  or pharmacist if you have questions. COMMON BRAND NAME(S): Endocet, Magnacet, Nalocet, Narvox, Percocet, Perloxx, Primalev, Primlev, Prolate, Roxicet, Xolox What should I tell my health care provider before I take this medicine? They need to know if you have any of these conditions:  brain tumor  Crohn's disease, inflammatory bowel disease, or ulcerative colitis  drug abuse or addiction  head injury  heart or circulation problems  if you often drink alcohol  kidney disease or problems going to the bathroom  liver disease  lung disease, asthma, or breathing problems  an unusual or allergic reaction to acetaminophen, oxycodone, other opioid analgesics, other medicines, foods, dyes, or preservatives  pregnant or trying to get pregnant  breast-feeding How should I use this medicine? Take this medicine by mouth with a full glass of water. Follow the directions on the prescription label. You can take it with or without food. If it upsets your stomach, take it with food. Take your medicine at regular intervals. Do not take it more often than directed. A special MedGuide will be given to you by the pharmacist with each prescription and refill. Be sure to read this information carefully each time. Talk to your pediatrician regarding the use of this medicine in children. Special care may be needed. Overdosage: If you think you have taken too much of this medicine contact a poison control center or emergency room at once. NOTE: This medicine is only for you. Do not share this medicine with others. What if I miss a dose? If you miss a dose, take it as soon as you can. If it is almost time for your next dose, take only that dose. Do not take double or extra doses. What may interact with this medicine? This  medicine may interact with the following medications:  alcohol  antihistamines for allergy, cough and cold  antiviral medicines for HIV or AIDS  atropine  certain antibiotics like  clarithromycin, erythromycin, linezolid, rifampin  certain medicines for anxiety or sleep  certain medicines for bladder problems like oxybutynin, tolterodine  certain medicines for depression like amitriptyline, fluoxetine, sertraline  certain medicines for fungal infections like ketoconazole, itraconazole, voriconazole  certain medicines for migraine headache like almotriptan, eletriptan, frovatriptan, naratriptan, rizatriptan, sumatriptan, zolmitriptan  certain medicines for nausea or vomiting like dolasetron, ondansetron, palonosetron  certain medicines for Parkinson's disease like benztropine, trihexyphenidyl  certain medicines for seizures like phenobarbital, phenytoin, primidone  certain medicines for stomach problems like dicyclomine, hyoscyamine  certain medicines for travel sickness like scopolamine  diuretics  general anesthetics like halothane, isoflurane, methoxyflurane, propofol  ipratropium  local anesthetics like lidocaine, pramoxine, tetracaine  MAOIs like Carbex, Eldepryl, Marplan, Nardil, and Parnate  medicines that relax muscles for surgery  methylene blue  nilotinib  other medicines with acetaminophen  other narcotic medicines for pain or cough  phenothiazines like chlorpromazine, mesoridazine, prochlorperazine, thioridazine This list may not describe all possible interactions. Give your health care provider a list of all the medicines, herbs, non-prescription drugs, or dietary supplements you use. Also tell them if you smoke, drink alcohol, or use illegal drugs. Some items may interact with your medicine. What should I watch for while using this medicine? Tell your health care provider if your pain does not go away, if it gets worse, or if you have new or a different type of pain. You may develop tolerance to this drug. Tolerance means that you will need a higher dose of the drug for pain relief. Tolerance is normal and is expected if you take this  drug for a long time. There are different types of narcotic drugs (opioids) for pain. If you take more than one type at the same time, you may have more side effects. Give your health care provider a list of all drugs you use. He or she will tell you how much drug to take. Do not take more drug than directed. Get emergency help right away if you have problems breathing. Do not suddenly stop taking your drug because you may develop a severe reaction. Your body becomes used to the drug. This does NOT mean you are addicted. Addiction is a behavior related to getting and using a drug for a nonmedical reason. If you have pain, you have a medical reason to take pain drug. Your health care provider will tell you how much drug to take. If your health care provider wants you to stop the drug, the dose will be slowly lowered over time to avoid any side effects. Talk to your health care provider about naloxone and how to get it. Naloxone is an emergency drug used for an opioid overdose. An overdose can happen if you take too much opioid. It can also happen if an opioid is taken with some other drugs or substances, like alcohol. Know the symptoms of an overdose, like trouble breathing, unusually tired or sleepy, or not being able to respond or wake up. Make sure to tell caregivers and close contacts where it is stored. Make sure they know how to use it. After naloxone is given, you must get emergency help right away. Naloxone is a temporary treatment. Repeat doses may be needed. Do not take other drugs that contain acetaminophen with this drug. Many non-prescription drugs contain acetaminophen. Always read labels  carefully. If you have questions, ask your health care provider. If you take too much acetaminophen, get medical help right away. Too much acetaminophen can be very dangerous and cause liver damage. Even if you do not have symptoms, it is important to get help right away. This drug does not prevent a heart attack  or stroke. This drug may increase the chance of a heart attack or stroke. The chance may increase the longer you use this drug or if you have heart disease. If you take aspirin to prevent a heart attack or stroke, talk to your health care provider about using this drug. You may get drowsy or dizzy. Do not drive, use machinery, or do anything that needs mental alertness until you know how this drug affects you. Do not stand up or sit up quickly, especially if you are an older patient. This reduces the risk of dizzy or fainting spells. Alcohol may interfere with the effect of this drug. Avoid alcoholic drinks. This drug will cause constipation. If you do not have a bowel movement for 3 days, call your health care provider. Your mouth may get dry. Chewing sugarless gum or sucking hard candy and drinking plenty of water may help. Contact your health care provider if the problem does not go away or is severe. What side effects may I notice from receiving this medicine? Side effects that you should report to your doctor or health care professional as soon as possible:  allergic reactions like skin rash, itching or hives, swelling of the face, lips, or tongue  breathing problems  confusion  redness, blistering, peeling or loosening of the skin, including inside the mouth  signs and symptoms of liver injury like dark yellow or brown urine; general ill feeling or flu-like symptoms; light-colored stools; loss of appetite; nausea; right upper belly pain; unusually weak or tired; yellowing of the eyes or skin  signs and symptoms of low blood pressure like dizziness; feeling faint or lightheaded, falls; unusually weak or tired  trouble passing urine or change in the amount of urine Side effects that usually do not require medical attention (report to your doctor or health care professional if they continue or are bothersome):  constipation  dry mouth  nausea, vomiting  tiredness This list may not  describe all possible side effects. Call your doctor for medical advice about side effects. You may report side effects to FDA at 1-800-FDA-1088. Where should I keep my medicine? Keep out of the reach of children. This medicine can be abused. Keep your medicine in a safe place to protect it from theft. Do not share this medicine with anyone. Selling or giving away this medicine is dangerous and against the law. Store at room temperature between 20 and 25 degrees C (68 and 77 degrees F). This medicine may cause harm and death if it is taken by other adults, children, or pets. Return medicine that has not been used to an official disposal site. Contact the DEA at 5015758622 or your city/county government to find a site. If you cannot return the medicine, flush it down the toilet. Do not use the medicine after the expiration date. NOTE: This sheet is a summary. It may not cover all possible information. If you have questions about this medicine, talk to your doctor, pharmacist, or health care provider.  2020 Elsevier/Gold Standard (2019-01-10 12:25:25)     Bupivacaine Liposomal Suspension for Injection What is this medicine? BUPIVACAINE LIPOSOMAL (bue PIV a kane LIP oh som al) is  an anesthetic. It causes loss of feeling in the skin or other tissues. It is used to prevent and to treat pain from some procedures. This medicine may be used for other purposes; ask your health care provider or pharmacist if you have questions. COMMON BRAND NAME(S): EXPAREL What should I tell my health care provider before I take this medicine? They need to know if you have any of these conditions:  G6PD deficiency  heart disease  kidney disease  liver disease  low blood pressure  lung or breathing disease, like asthma  an unusual or allergic reaction to bupivacaine, other medicines, foods, dyes, or preservatives  pregnant or trying to get pregnant  breast-feeding How should I use this medicine? This  medicine is for injection into the affected area. It is given by a health care professional in a hospital or clinic setting. Talk to your pediatrician regarding the use of this medicine in children. Special care may be needed. Overdosage: If you think you have taken too much of this medicine contact a poison control center or emergency room at once. NOTE: This medicine is only for you. Do not share this medicine with others. What if I miss a dose? This does not apply. What may interact with this medicine? This medicine may interact with the following medications:  acetaminophen  certain antibiotics like dapsone, nitrofurantoin, aminosalicylic acid, sulfonamides  certain medicines for seizures like phenobarbital, phenytoin, valproic acid  chloroquine  cyclophosphamide  flutamide  hydroxyurea  ifosfamide  metoclopramide  nitric oxide  nitroglycerin  nitroprusside  nitrous oxide  other local anesthetics like lidocaine, pramoxine, tetracaine  primaquine  quinine  rasburicase  sulfasalazine This list may not describe all possible interactions. Give your health care provider a list of all the medicines, herbs, non-prescription drugs, or dietary supplements you use. Also tell them if you smoke, drink alcohol, or use illegal drugs. Some items may interact with your medicine. What should I watch for while using this medicine? Your condition will be monitored carefully while you are receiving this medicine. Be careful to avoid injury while the area is numb, and you are not aware of pain. What side effects may I notice from receiving this medicine? Side effects that you should report to your doctor or health care professional as soon as possible:  allergic reactions like skin rash, itching or hives, swelling of the face, lips, or tongue  seizures  signs and symptoms of a dangerous change in heartbeat or heart rhythm like chest pain; dizziness; fast, irregular heartbeat;  palpitations; feeling faint or lightheaded; falls; breathing problems  signs and symptoms of methemoglobinemia such as pale, gray, or blue colored skin; headache; fast heartbeat; shortness of breath; feeling faint or lightheaded, falls; tiredness Side effects that usually do not require medical attention (report to your doctor or health care professional if they continue or are bothersome):  anxious  back pain  changes in taste  changes in vision  constipation  dizziness  fever  nausea, vomiting This list may not describe all possible side effects. Call your doctor for medical advice about side effects. You may report side effects to FDA at 1-800-FDA-1088. Where should I keep my medicine? This drug is given in a hospital or clinic and will not be stored at home. NOTE: This sheet is a summary. It may not cover all possible information. If you have questions about this medicine, talk to your doctor, pharmacist, or health care provider.  2020 Elsevier/Gold Standard (2019-03-14 10:48:23)   PLEASE  [place] EX[facility]EL BRACELET UNTIL Sunday June 16, 2020. DO NOT USE ADDITIONAL NUMBING MEDICATIONS WITHOUT CONSULTING A PHYSICIAN

## 2020-06-12 NOTE — Transfer of Care (Signed)
Immediate Anesthesia Transfer of Care Note  Patient: Jasmin Chen  Procedure(s) Performed: LAPAROSCOPIC UNILATERAL SALPINGO OOPHORECTOMY (Right )  Patient Location: PACU  Anesthesia Type:General  Level of Consciousness: awake, alert  and oriented  Airway & Oxygen Therapy: Patient Spontanous Breathing and Patient connected to nasal cannula oxygen  Post-op Assessment: Report given to RN and Post -op Vital signs reviewed and stable  Post vital signs: Reviewed and stable  Last Vitals:  Vitals Value Taken Time  BP 123/85   Temp 97.5   Pulse 92 06/12/20 1424  Resp 24 06/12/20 1424  SpO2 98 % 06/12/20 1424  Vitals shown include unvalidated device data.  Last Pain:  Vitals:   06/12/20 1140  TempSrc: Oral  PainSc: 5       Patients Stated Pain Goal: 8 (06/12/20 1140)  Complications: No complications documented.

## 2020-06-12 NOTE — Anesthesia Preprocedure Evaluation (Signed)
Anesthesia Evaluation  Patient identified by MRN, date of birth, ID band Patient awake    Reviewed: Allergy & Precautions, H&P , NPO status , Patient's Chart, lab work & pertinent test results, reviewed documented beta blocker date and time   Airway Mallampati: II  TM Distance: >3 FB Neck ROM: full    Dental no notable dental hx.    Pulmonary asthma ,    Pulmonary exam normal breath sounds clear to auscultation       Cardiovascular Exercise Tolerance: Good hypertension, negative cardio ROS   Rhythm:regular Rate:Normal     Neuro/Psych  Headaches, negative psych ROS   GI/Hepatic Neg liver ROS, GERD  Medicated,  Endo/Other  Morbid obesity  Renal/GU negative Renal ROS  negative genitourinary   Musculoskeletal   Abdominal   Peds  Hematology negative hematology ROS (+)   Anesthesia Other Findings   Reproductive/Obstetrics negative OB ROS                             Anesthesia Physical Anesthesia Plan  ASA: III  Anesthesia Plan: General   Post-op Pain Management:    Induction:   PONV Risk Score and Plan: Ondansetron  Airway Management Planned:   Additional Equipment:   Intra-op Plan:   Post-operative Plan:   Informed Consent: I have reviewed the patients History and Physical, chart, labs and discussed the procedure including the risks, benefits and alternatives for the proposed anesthesia with the patient or authorized representative who has indicated his/her understanding and acceptance.     Dental Advisory Given  Plan Discussed with: CRNA  Anesthesia Plan Comments:         Anesthesia Quick Evaluation

## 2020-06-12 NOTE — H&P (Signed)
Preoperative History and Physical  Jasmin Chen is a 39 y.o. G0P0000 with No LMP recorded. Patient has had a hysterectomy. admitted for a laparoscopic removal of right tube and ovary due to a painful persistent right ovarian cyst.    PMH:    Past Medical History:  Diagnosis Date  . Asthma   . Chronic knee pain   . Chronic rhinitis   . Hidradenitis suppurativa   . Hypertension   . IBS (irritable bowel syndrome)   . Migraine without aura 2009  . Multiple environmental allergies   . Other and unspecified ovarian cyst 02/06/2013  . Ovarian cyst   . Papanicolaou smear of cervix with positive high risk human papilloma virus (HPV) test 07/20/2017  . Right leg weakness    chronic    PSH:     Past Surgical History:  Procedure Laterality Date  . 2 cysts removed from arm    . ABDOMINAL HYSTERECTOMY    . BREAST REDUCTION SURGERY    . deviated septum repair    . hemroid surgery    . LAPAROSCOPIC GASTRIC SLEEVE RESECTION      POb/GynH:      OB History    Gravida  0   Para  0   Term  0   Preterm  0   AB  0   Living  0     SAB  0   IAB  0   Ectopic  0   Multiple  0   Live Births  0           SH:   Social History   Tobacco Use  . Smoking status: Never Smoker  . Smokeless tobacco: Never Used  Vaping Use  . Vaping Use: Never used  Substance Use Topics  . Alcohol use: No  . Drug use: No    FH:    Family History  Problem Relation Age of Onset  . Hypertension Mother   . Diabetes Mother   . Hyperlipidemia Mother   . Hypertension Father   . Hyperlipidemia Father   . Heart attack Father   . Sarcoidosis Sister   . Thyroid disease Sister   . Hyperlipidemia Sister   . Hypertension Sister   . Colon cancer Neg Hx   . Stomach cancer Neg Hx   . Pancreatic cancer Neg Hx   . Rectal cancer Neg Hx   . Esophageal cancer Neg Hx      Allergies:  Allergies  Allergen Reactions  . Prednisone Other (See Comments)    Difficulty concentrating and functioning     Medications:       Current Facility-Administered Medications:  .  bupivacaine liposome (EXPAREL) 1.3 % injection 266 mg, 20 mL, Infiltration, Once, Shawndale Kilpatrick H, MD .  ceFAZolin (ANCEF) IVPB 2g/100 mL premix, 2 g, Intravenous, On Call to OR, Lazaro Arms, MD .  povidone-iodine 10 % swab 2 application, 2 application, Topical, Once, Lazaro Arms, MD  Review of Systems:   Review of Systems  Constitutional: Negative for fever, chills, weight loss, malaise/fatigue and diaphoresis.  HENT: Negative for hearing loss, ear pain, nosebleeds, congestion, sore throat, neck pain, tinnitus and ear discharge.   Eyes: Negative for blurred vision, double vision, photophobia, pain, discharge and redness.  Respiratory: Negative for cough, hemoptysis, sputum production, shortness of breath, wheezing and stridor.   Cardiovascular: Negative for chest pain, palpitations, orthopnea, claudication, leg swelling and PND.  Gastrointestinal: Positive for abdominal pain. Negative for heartburn, nausea, vomiting,  diarrhea, constipation, blood in stool and melena.  Genitourinary: Negative for dysuria, urgency, frequency, hematuria and flank pain.  Musculoskeletal: Negative for myalgias, back pain, joint pain and falls.  Skin: Negative for itching and rash.  Neurological: Negative for dizziness, tingling, tremors, sensory change, speech change, focal weakness, seizures, loss of consciousness, weakness and headaches.  Endo/Heme/Allergies: Negative for environmental allergies and polydipsia. Does not bruise/bleed easily.  Psychiatric/Behavioral: Negative for depression, suicidal ideas, hallucinations, memory loss and substance abuse. The patient is not nervous/anxious and does not have insomnia.      PHYSICAL EXAM:  Blood pressure 115/71, temperature 97.9 F (36.6 C), temperature source Oral, resp. rate 13, SpO2 97 %.    Vitals reviewed. Constitutional: She is oriented to person, place, and time. She appears  well-developed and well-nourished.  HENT:  Head: Normocephalic and atraumatic.  Right Ear: External ear normal.  Left Ear: External ear normal.  Nose: Nose normal.  Mouth/Throat: Oropharynx is clear and moist.  Eyes: Conjunctivae and EOM are normal. Pupils are equal, round, and reactive to light. Right eye exhibits no discharge. Left eye exhibits no discharge. No scleral icterus.  Neck: Normal range of motion. Neck supple. No tracheal deviation present. No thyromegaly present.  Cardiovascular: Normal rate, regular rhythm, normal heart sounds and intact distal pulses.  Exam reveals no gallop and no friction rub.   No murmur heard. Respiratory: Effort normal and breath sounds normal. No respiratory distress. She has no wheezes. She has no rales. She exhibits no tenderness.  GI: Soft. Bowel sounds are normal. She exhibits no distension and no mass. There is tenderness. There is no rebound and no guarding.  Genitourinary:       Vulva is normal without lesions Vagina is pink moist without discharge Cervix absent Uterus is absent Adnexa is per sonogram Musculoskeletal: Normal range of motion. She exhibits no edema and no tenderness.  Neurological: She is alert and oriented to person, place, and time. She has normal reflexes. She displays normal reflexes. No cranial nerve deficit. She exhibits normal muscle tone. Coordination normal.  Skin: Skin is warm and dry. No rash noted. No erythema. No pallor.  Psychiatric: She has a normal mood and affect. Her behavior is normal. Judgment and thought content normal.    Labs: Results for orders placed or performed during the hospital encounter of 06/11/20 (from the past 336 hour(s))  SARS CORONAVIRUS 2 (TAT 6-24 HRS) Nasopharyngeal Nasopharyngeal Swab   Collection Time: 06/11/20 11:28 AM   Specimen: Nasopharyngeal Swab  Result Value Ref Range   SARS Coronavirus 2 NEGATIVE NEGATIVE  CBC   Collection Time: 06/11/20 11:28 AM  Result Value Ref Range    WBC 6.0 4.0 - 10.5 K/uL   RBC 4.64 3.87 - 5.11 MIL/uL   Hemoglobin 13.4 12.0 - 15.0 g/dL   HCT 41.2 36.0 - 46.0 %   MCV 88.8 80.0 - 100.0 fL   MCH 28.9 26.0 - 34.0 pg   MCHC 32.5 30.0 - 36.0 g/dL   RDW 14.2 11.5 - 15.5 %   Platelets 264 150 - 400 K/uL   nRBC 0.0 0.0 - 0.2 %  Comprehensive metabolic panel   Collection Time: 06/11/20 11:28 AM  Result Value Ref Range   Sodium 136 135 - 145 mmol/L   Potassium 3.5 3.5 - 5.1 mmol/L   Chloride 103 98 - 111 mmol/L   CO2 25 22 - 32 mmol/L   Glucose, Bld 115 (H) 70 - 99 mg/dL   BUN 9 6 - 20  mg/dL   Creatinine, Ser 0.56 0.44 - 1.00 mg/dL   Calcium 8.7 (L) 8.9 - 10.3 mg/dL   Total Protein 7.5 6.5 - 8.1 g/dL   Albumin 4.1 3.5 - 5.0 g/dL   AST 15 15 - 41 U/L   ALT 13 0 - 44 U/L   Alkaline Phosphatase 47 38 - 126 U/L   Total Bilirubin 0.8 0.3 - 1.2 mg/dL   GFR, Estimated >60 >60 mL/min   Anion gap 8 5 - 15  Rapid HIV screen (HIV 1/2 Ab+Ag)   Collection Time: 06/11/20 11:28 AM  Result Value Ref Range   HIV-1 P24 Antigen - HIV24 NON REACTIVE NON REACTIVE   HIV 1/2 Antibodies NON REACTIVE NON REACTIVE   Interpretation (HIV Ag Ab)      A non reactive test result means that HIV 1 or HIV 2 antibodies and HIV 1 p24 antigen were not detected in the specimen.  Urinalysis, Routine w reflex microscopic Urine, Clean Catch   Collection Time: 06/11/20 11:28 AM  Result Value Ref Range   Color, Urine YELLOW YELLOW   APPearance HAZY (A) CLEAR   Specific Gravity, Urine 1.016 1.005 - 1.030   pH 6.0 5.0 - 8.0   Glucose, UA NEGATIVE NEGATIVE mg/dL   Hgb urine dipstick NEGATIVE NEGATIVE   Bilirubin Urine NEGATIVE NEGATIVE   Ketones, ur NEGATIVE NEGATIVE mg/dL   Protein, ur NEGATIVE NEGATIVE mg/dL   Nitrite NEGATIVE NEGATIVE   Leukocytes,Ua NEGATIVE NEGATIVE  Type and screen   Collection Time: 06/11/20 11:28 AM  Result Value Ref Range   ABO/RH(D) A POS    Antibody Screen NEG    Sample Expiration      06/14/2020,2359 Performed at [facility], [address] [place], [place] [address]     EKG: Orders placed or performed during the hospital encounter of 02/13/15  . ED EKG  . ED EKG  . EKG 12-Lead  . EKG 12-Lead  . EKG    Imaging Studies: CT ABDOMEN PELVIS W CONTRAST  Result Date: 05/14/2020 CLINICAL DATA:  39 year old female with right lower quadrant abdominal pain. EXAM: CT ABDOMEN AND PELVIS WITH CONTRAST TECHNIQUE: Multidetector CT imaging of the abdomen and pelvis was performed using the standard protocol following bolus administration of intravenous contrast. CONTRAST:  148mL OMNIPAQUE IOHEXOL 300 MG/ML  SOLN COMPARISON:  CT abdomen pelvis dated 09/14/2019. FINDINGS: Lower chest: The visualized lung bases are clear. No intra-abdominal free air or free fluid. Hepatobiliary: No focal liver abnormality is seen. No gallstones, gallbladder wall thickening, or biliary dilatation. Pancreas: Unremarkable. No pancreatic ductal dilatation or surrounding inflammatory changes. Spleen: Normal in size without focal abnormality. Adrenals/Urinary Tract: The adrenal glands unremarkable. There is no hydronephrosis on either side. There is symmetric enhancement and excretion of contrast by both kidneys. The visualized ureters and urinary bladder appear unremarkable. Stomach/Bowel: Postsurgical changes of gastric bypass. There is no bowel obstruction or active inflammation. The appendix is normal. Vascular/Lymphatic: The abdominal aorta and IVC unremarkable. No portal venous gas. There is no adenopathy. Reproductive: Hysterectomy. There is a 4 cm right ovarian cyst. Ultrasound may provide better evaluation. Other: Anterior pelvic wall C-section scar. Musculoskeletal: No acute or significant osseous findings. IMPRESSION: 1. A 4 cm right ovarian cyst. Ultrasound may provide better evaluation. 2. No bowel obstruction. Normal appendix. Electronically Signed   By: Anner Crete M.D.   On: 05/14/2020 20:47   US PELVIC COMPLETE W TRANSVAGINAL AND  TORSION R/O  Result Date: 05/14/2020 CLINICAL DATA:  Right lower quadrant  pain, prior hysterectomy in 2011 EXAM: TRANSABDOMINAL AND TRANSVAGINAL ULTRASOUND OF PELVIS DOPPLER ULTRASOUND OF OVARIES TECHNIQUE: Both transabdominal and transvaginal ultrasound examinations of the pelvis were performed. Transabdominal technique was performed for global imaging of the pelvis including uterus, ovaries, adnexal regions, and pelvic cul-de-sac. It was necessary to proceed with endovaginal exam following the transabdominal exam to visualize the bilateral ovaries. Color and duplex Doppler ultrasound was utilized to evaluate blood flow to the ovaries. COMPARISON:  Pelvic ultrasound 10/02/2019 (no report), CT 09/14/2019 FINDINGS: Uterus Postsurgical changes from prior hysterectomy as seen previously. Few rounded anechoic cysts at the cervical cuff, likely nabothian cysts. No concerning masses. Endometrium Surgically absent Right ovary Measurements: 4.3 x 4.3 x 4.6 cm = volume: 44 mL. 3.8 x 3.0 x 3.4 cm predominantly anechoic cyst seen in the right ovary with a thin avascular internal septation. No other focal lesion. Left ovary Measurements: 2.8 x 2.1 x 1.4 cm = volume: 4.3 mL. Normal appearance. Lobular, thick-walled cystic lesion, likely involuting corpus luteum seen in the left ovary, benign physiologic finding. No concerning mass. Pulsed Doppler evaluation of both ovaries demonstrates normal low-resistance arterial and venous waveforms. Other findings No abnormal free fluid. IMPRESSION: 1. 3.8 cm predominantly anechoic cyst with a thin avascular internal septation in the right ovary. This has benign characteristics and is a common finding in premenopausal females. While imaging follow-up is not typically recommended, given that pelvic pain is ipsilateral to this finding, follow-up ultrasound could be obtained in 6-8 weeks to assess for resolution. 2. No evidence of ovarian torsion. 3. Prior hysterectomy. Few anechoic  nabothian cysts at the cervical cuff. Electronically Signed   By: Kreg Shropshire M.D.   On: 05/14/2020 22:14      Assessment: Right ovarian cyst causing RLQ pain  Plan: Laparoscopic RSO Pt understands this is a benign cyst but due to the pain it is causing her she wants it removed  Lazaro Arms 06/12/2020 12:22 PM

## 2020-06-12 NOTE — Anesthesia Procedure Notes (Signed)
Procedure Name: Intubation Date/Time: 06/12/2020 12:38 PM Performed by: Karna Dupes, CRNA Pre-anesthesia Checklist: Patient identified, Emergency Drugs available, Suction available and Patient being monitored Patient Re-evaluated:Patient Re-evaluated prior to induction Oxygen Delivery Method: Circle system utilized Preoxygenation: Pre-oxygenation with 100% oxygen Induction Type: IV induction Ventilation: Mask ventilation without difficulty Laryngoscope Size: Mac and 3 Grade View: Grade I Tube type: Oral Number of attempts: 1 Airway Equipment and Method: Stylet Placement Confirmation: ETT inserted through vocal cords under direct vision,  positive ETCO2 and breath sounds checked- equal and bilateral Secured at: 21 cm Tube secured with: Tape Dental Injury: Teeth and Oropharynx as per pre-operative assessment

## 2020-06-13 NOTE — Anesthesia Postprocedure Evaluation (Signed)
Anesthesia Post Note  Patient: Jasmin Chen  Procedure(s) Performed: LAPAROSCOPIC UNILATERAL SALPINGO OOPHORECTOMY (Right )  Patient location during evaluation: Phase II Anesthesia Type: General Level of consciousness: awake Pain management: pain level controlled Vital Signs Assessment: post-procedure vital signs reviewed and stable Respiratory status: spontaneous breathing Cardiovascular status: blood pressure returned to baseline Anesthetic complications: no   No complications documented.   Last Vitals:  Vitals:   06/12/20 1515 06/12/20 1539  BP: 125/75 124/81  Pulse: 73 72  Resp: 14 16  Temp:  36.5 C  SpO2: 98% 96%    Last Pain:  Vitals:   06/12/20 1539  TempSrc: Axillary  PainSc: 4                  Windell Norfolk

## 2020-06-17 ENCOUNTER — Encounter (HOSPITAL_COMMUNITY): Payer: Self-pay | Admitting: Obstetrics & Gynecology

## 2020-06-17 LAB — SURGICAL PATHOLOGY

## 2020-06-18 ENCOUNTER — Other Ambulatory Visit: Payer: Self-pay | Admitting: Obstetrics & Gynecology

## 2020-06-18 MED ORDER — CEPHALEXIN 500 MG PO CAPS: 500.0000 mg | ORAL_CAPSULE | Freq: Three times a day (TID) | ORAL | 0 refills | Status: DC

## 2020-06-19 ENCOUNTER — Encounter: Payer: BC Managed Care – PPO | Admitting: Orthotics

## 2020-06-21 ENCOUNTER — Encounter: Payer: Self-pay | Admitting: Obstetrics & Gynecology

## 2020-06-21 ENCOUNTER — Telehealth (INDEPENDENT_AMBULATORY_CARE_PROVIDER_SITE_OTHER): Payer: BC Managed Care – PPO | Admitting: Obstetrics & Gynecology

## 2020-06-21 DIAGNOSIS — Z9889 Other specified postprocedural states: Secondary | ICD-10-CM

## 2020-06-21 NOTE — Progress Notes (Signed)
Telephone post op visit Pt was at home and I was at the office   HPI: Patient returns for routine postoperative follow-up having undergone laparoscopic RSO, extensive lysis of adhesions on 06/12/20.  The patient's immediate postoperative recovery has been unremarkable. Since hospital discharge the patient reports no problems.   Current Outpatient Medications: cephALEXin (KEFLEX) 500 MG capsule, Take 1 capsule (500 mg total) by mouth 3 (three) times daily., Disp: 21 capsule, Rfl: 0 acetaminophen (TYLENOL) 500 MG tablet, Take 500-1,000 mg by mouth every 6 (six) hours as needed (pain.)., Disp: , Rfl:  albuterol (PROVENTIL) (2.5 MG/3ML) 0.083% nebulizer solution, Take 3 mLs (2.5 mg total) by nebulization every 4 (four) hours as needed for wheezing or shortness of breath., Disp: 75 mL, Rfl: 5 albuterol (VENTOLIN HFA) 108 (90 Base) MCG/ACT inhaler, Inhale 2 puffs into the lungs every 4 (four) hours as needed for wheezing or shortness of breath., Disp: 1 Inhaler, Rfl: 5 ALPRAZolam (XANAX) 0.25 MG tablet, TAKE 1/2 TO 1 TABLET BY MOUTH TWICE A DAY AS NEEDED ANXIETY (Patient taking differently: Take 0.125-0.25 mg by mouth 2 (two) times daily as needed for anxiety.), Disp: 30 tablet, Rfl: 0 fluticasone (FLONASE) 50 MCG/ACT nasal spray, Place 2 sprays into both nostrils daily., Disp: 16 g, Rfl: 0 ketorolac (TORADOL) 10 MG tablet, Take 1 tablet (10 mg total) by mouth every 8 (eight) hours as needed., Disp: 15 tablet, Rfl: 0 loratadine (CLARITIN) 10 MG tablet, Take 1 tablet (10 mg total) by mouth daily., Disp: 30 tablet, Rfl: 0 meloxicam (MOBIC) 15 MG tablet, TAKE 1 TABLET BY MOUTH EVERY DAY (Patient taking differently: Take 15 mg by mouth daily.), Disp: 30 tablet, Rfl: 1 Multiple Vitamin (MULTIVITAMIN WITH MINERALS) TABS tablet, Take 1 tablet by mouth daily., Disp: , Rfl:  naproxen (NAPROSYN) 500 MG tablet, Take 1 tablet (500 mg total) by mouth 2 (two) times daily. (Patient taking differently: Take 500 mg by  mouth 2 (two) times daily as needed (pain.).), Disp: 30 tablet, Rfl: 0 omeprazole (PRILOSEC) 20 MG capsule, Take 20 mg by mouth daily as needed (Acid Reflux)., Disp: , Rfl:  ondansetron (ZOFRAN ODT) 8 MG disintegrating tablet, Take 1 tablet (8 mg total) by mouth every 8 (eight) hours as needed for nausea or vomiting., Disp: 20 tablet, Rfl: 0 oxyCODONE-acetaminophen (PERCOCET) 5-325 MG tablet, Take 1-2 tablets by mouth every 4 (four) hours as needed for severe pain., Disp: 30 tablet, Rfl: 0  No current facility-administered medications for this visit.    There were no vitals taken for this visit.  Physical Exam:   n/a  Diagnostic Tests:   Pathology: benign  Impression: S/P laparoscopic RSO with extensive lysis of adhesions   Plan:     Follow up: prn   Florian Buff, MD

## 2020-06-23 ENCOUNTER — Other Ambulatory Visit: Payer: Self-pay

## 2020-06-23 ENCOUNTER — Encounter (HOSPITAL_COMMUNITY): Payer: Self-pay | Admitting: *Deleted

## 2020-06-23 DIAGNOSIS — R519 Headache, unspecified: Secondary | ICD-10-CM | POA: Insufficient documentation

## 2020-06-23 DIAGNOSIS — R197 Diarrhea, unspecified: Secondary | ICD-10-CM | POA: Diagnosis not present

## 2020-06-23 DIAGNOSIS — M542 Cervicalgia: Secondary | ICD-10-CM | POA: Diagnosis not present

## 2020-06-23 DIAGNOSIS — I10 Essential (primary) hypertension: Secondary | ICD-10-CM | POA: Diagnosis not present

## 2020-06-23 DIAGNOSIS — Z20822 Contact with and (suspected) exposure to covid-19: Secondary | ICD-10-CM | POA: Diagnosis not present

## 2020-06-23 DIAGNOSIS — J45909 Unspecified asthma, uncomplicated: Secondary | ICD-10-CM | POA: Diagnosis not present

## 2020-06-23 DIAGNOSIS — J029 Acute pharyngitis, unspecified: Secondary | ICD-10-CM | POA: Insufficient documentation

## 2020-06-23 NOTE — ED Triage Notes (Signed)
Pt with diarrhea and body aches since Friday.  Pt with known Covid exposure.  Unknown of fever.

## 2020-06-24 ENCOUNTER — Emergency Department (HOSPITAL_COMMUNITY)
Admission: EM | Admit: 2020-06-24 | Discharge: 2020-06-24 | Disposition: A | Discharge status: Home / Self Care | Payer: BC Managed Care – PPO | Attending: Emergency Medicine | Admitting: Emergency Medicine

## 2020-06-24 DIAGNOSIS — Z20822 Contact with and (suspected) exposure to covid-19: Secondary | ICD-10-CM

## 2020-06-24 LAB — I-STAT CHEM 8, ED
BUN: 11 mg/dL (ref 6–20)
Calcium, Ion: 1.17 mmol/L (ref 1.15–1.40)
Chloride: 102 mmol/L (ref 98–111)
Creatinine, Ser: 0.6 mg/dL (ref 0.44–1.00)
Glucose, Bld: 99 mg/dL (ref 70–99)
HCT: 41 % (ref 36.0–46.0)
Hemoglobin: 13.9 g/dL (ref 12.0–15.0)
Potassium: 3.9 mmol/L (ref 3.5–5.1)
Sodium: 140 mmol/L (ref 135–145)
TCO2: 27 mmol/L (ref 22–32)

## 2020-06-24 LAB — SARS CORONAVIRUS 2 (TAT 6-24 HRS): SARS Coronavirus 2: NEGATIVE

## 2020-06-24 MED ORDER — LOPERAMIDE HCL 2 MG PO CAPS
4.0000 mg | ORAL_CAPSULE | Freq: Once | ORAL | Status: AC
  Administered 2020-06-24: 4 mg via ORAL
  Filled 2020-06-24: qty 2

## 2020-06-24 NOTE — Discharge Instructions (Addendum)
Drink plenty of fluids.  Take Imodium AD over-the-counter for your diarrhea.  Avoid milk products until the diarrhea is gone.  However you can eat yogurt which replaces the "good" bacteria back into your intestinal tract.  Or you can take any probiotic over-the-counter.  Start taking zinc 50 mg once a day, vitamin D and vitamin C daily, and aspirin 81 mg once a day.  You can check your covid test result later today in My Chart. Return to the emergency room if you are having difficulty breathing.

## 2020-06-24 NOTE — ED Provider Notes (Signed)
[facility] EMERGENCY DEPARTMENT Provider Note   CSN: [id number] Arrival date & time: 06/23/20  2146   Time seen 1:59 AM  History Chief Complaint  Patient presents with  . Diarrhea    Jasmin Chen is a 40 y.o. female.  HPI   Patient states she was around her niece and nephew who tested positive for COVID on January 8 and 9.  She states on January 7 she started having diarrhea.  She had about 5 episodes on January 8 and about 5-6 episodes on January 9.  She describes it as loose.  She states she gets lower abdominal pain right before the diarrhea and then after the diarrhea it slowly goes away.  She has a frontal headache and she is having aches in her neck and shoulders.  She is having some clear rhinorrhea, minimal cough and mild sore throat.  She denies fever, nausea, vomiting, chest pain, or shortness of breath.  She states she had a surgery done on December 29 on her right ovary by Dr. Elonda Husky.  She had called the office because she was getting an infection at the umbilical site and has been on Keflex since January 6.  She had a telemedicine conference with Dr. Elonda Husky on Friday, January 7.  Patient has had the [place]  PCP Kathyrn Drown, MD    Past Medical History:  Diagnosis Date  . Asthma   . Chronic knee pain   . Chronic rhinitis   . Hidradenitis suppurativa   . Hypertension   . IBS (irritable bowel syndrome)   . Migraine without aura 2009  . Multiple environmental allergies   . Other and unspecified ovarian cyst 02/06/2013  . Ovarian cyst   . Papanicolaou smear of cervix with positive high risk human papilloma virus (HPV) test 07/20/2017  . Right leg weakness    chronic    Patient Active Problem List   Diagnosis Date Noted  . RLQ abdominal pain 05/20/2020  . Right ovarian cyst 05/20/2020  . Cyst of left ovary 10/10/2019  . Encounter for gynecological examination with Papanicolaou smear of cervix 10/10/2019  . Enlarged ovary 09/22/2019  . Papanicolaou  smear of cervix with positive high risk human papilloma virus (HPV) test 07/20/2017  . Rapid palpitations 04/26/2017  . History of syncope 04/26/2017  . Syncope 07/12/2015  . 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 Left ovarian cyst 03/11/2014  . Ovarian cyst 03/11/2014  . Knee pain 10/10/2013  . Stiffness of joint, not elsewhere classified, lower leg 10/10/2013  . Weakness of right leg 10/10/2013  . Other and unspecified ovarian cyst 03/20/2013  . Other and unspecified ovarian cyst 02/06/2013  . Other and unspecified ovarian cyst 02/06/2013  . IBS (irritable bowel syndrome) 02/03/2013  . Acid reflux 09/28/2012  . HLD (hyperlipidemia) 09/28/2012  . Benign hypertension 09/28/2012  . Hidradenitis suppurativa 09/19/2012  . Morbid obesity ([place]) 09/19/2012  . RECTAL BLEEDING 08/20/2010  . ABDOMINAL PAIN-LLQ 08/20/2010    Past Surgical History:  Procedure Laterality Date  . 2 cysts removed from arm    . ABDOMINAL HYSTERECTOMY    . BREAST REDUCTION SURGERY    . deviated septum repair    . hemroid surgery    . LAPAROSCOPIC GASTRIC SLEEVE RESECTION    . LAPAROSCOPIC UNILATERAL SALPINGO OOPHERECTOMY Right 06/12/2020   Procedure: LAPAROSCOPIC UNILATERAL SALPINGO OOPHORECTOMY;  Surgeon: Florian Buff, MD;  Location: [facility];  Service: Gynecology;  Laterality: Right;     OB History    Gravida  0   Para  0   Term  0   Preterm  0   AB  0   Living  0     SAB  0   IAB  0   Ectopic  0   Multiple  0   Live Births  0           Family History  Problem Relation Age of Onset  . Hypertension Mother   . Diabetes Mother   . Hyperlipidemia Mother   . Hypertension Father   . Hyperlipidemia Father   . Heart attack Father   . Sarcoidosis Sister   . Thyroid disease Sister   . Hyperlipidemia Sister   . Hypertension Sister   . Colon cancer Neg Hx   . Stomach cancer Neg Hx   . Pancreatic cancer Neg Hx   . Rectal cancer Neg Hx   . Esophageal cancer Neg Hx     Social History    Tobacco Use  . Smoking status: Never Smoker  . Smokeless tobacco: Never Used  Vaping Use  . Vaping Use: Never used  Substance Use Topics  . Alcohol use: No  . Drug use: No    Home Medications Prior to Admission medications   Medication Sig Start Date End Date Taking? Authorizing Provider  acetaminophen (TYLENOL) 500 MG tablet Take 500-1,000 mg by mouth every 6 (six) hours as needed (pain.).    [provider]  albuterol (PROVENTIL) (2.5 MG/3ML) 0.083% nebulizer solution Take 3 mLs (2.5 mg total) by nebulization every 4 (four) hours as needed for wheezing or shortness of breath. 10/03/18   Babs SciaraLuking, Scott A, MD  albuterol (VENTOLIN HFA) 108 (90 Base) MCG/ACT inhaler Inhale 2 puffs into the lungs every 4 (four) hours as needed for wheezing or shortness of breath. 10/03/18   Babs SciaraLuking, Scott A, MD  ALPRAZolam (XANAX) 0.25 MG tablet TAKE 1/2 TO 1 TABLET BY MOUTH TWICE A DAY AS NEEDED ANXIETY Patient taking differently: Take 0.125-0.25 mg by mouth 2 (two) times daily as needed for anxiety. 09/18/19   Babs SciaraLuking, Scott A, MD  cephALEXin (KEFLEX) 500 MG capsule Take 1 capsule (500 mg total) by mouth 3 (three) times daily. 06/18/20   Lazaro ArmsEure, Luther H, MD  fluticasone (FLONASE) 50 MCG/ACT nasal spray Place 2 sprays into both nostrils daily. 06/26/17   Everlene Farrieransie, William, PA-C  ketorolac (TORADOL) 10 MG tablet Take 1 tablet (10 mg total) by mouth every 8 (eight) hours as needed. 06/12/20   Lazaro ArmsEure, Luther H, MD  loratadine (CLARITIN) 10 MG tablet Take 1 tablet (10 mg total) by mouth daily. 06/26/17   Everlene Farrieransie, William, PA-C  meloxicam (MOBIC) 15 MG tablet TAKE 1 TABLET BY MOUTH EVERY DAY Patient taking differently: Take 15 mg by mouth daily. 06/05/20   Edwin CapMcDonald, Adam R, DPM  Multiple Vitamin (MULTIVITAMIN WITH MINERALS) TABS tablet Take 1 tablet by mouth daily.    [provider]  naproxen (NAPROSYN) 500 MG tablet Take 1 tablet (500 mg total) by mouth 2 (two) times daily. Patient taking differently: Take  500 mg by mouth 2 (two) times daily as needed (pain.). 05/14/20   Khatri, Hina, PA-C  omeprazole (PRILOSEC) 20 MG capsule Take 20 mg by mouth daily as needed (Acid Reflux).    [provider]  ondansetron (ZOFRAN ODT) 8 MG disintegrating tablet Take 1 tablet (8 mg total) by mouth every 8 (eight) hours as needed for nausea or vomiting. 06/12/20   Lazaro ArmsEure, Luther H, MD  oxyCODONE-acetaminophen (PERCOCET) [phone]  MG tablet Take 1-2 tablets by mouth every 4 (four) hours as needed for severe pain. 06/12/20   Florian Buff, MD    Allergies    Prednisone  Review of Systems   Review of Systems  All other systems reviewed and are negative.   Physical Exam Updated Vital Signs BP 131/80   Pulse 75   Temp 98.3 F (36.8 C) (Oral)   Resp 18   Ht 5' (1.524 m)   Wt 99.8 kg   SpO2 98%   BMI 42.97 kg/m   Physical Exam Vitals and nursing note reviewed.  Constitutional:      General: She is not in acute distress.    Appearance: Normal appearance. She is obese. She is not toxic-appearing.  HENT:     Head: Normocephalic and atraumatic.     Right Ear: External ear normal.     Left Ear: External ear normal.  Eyes:     Extraocular Movements: Extraocular movements intact.     Conjunctiva/sclera: Conjunctivae normal.     Pupils: Pupils are equal, round, and reactive to light.  Cardiovascular:     Rate and Rhythm: Normal rate and regular rhythm.     Pulses: Normal pulses.     Heart sounds: Normal heart sounds.  Pulmonary:     Effort: Pulmonary effort is normal. No respiratory distress.  Abdominal:     General: Bowel sounds are normal.     Palpations: Abdomen is soft.     Tenderness: There is abdominal tenderness. There is no guarding or rebound.     Comments: Her umbilicus appears to be healing very well, there is some bruising seen in her lower abdomen consistent with her history of recent surgery.  Musculoskeletal:        General: Normal range of motion.     Cervical back: Normal  range of motion.  Skin:    General: Skin is warm and dry.  Neurological:     General: No focal deficit present.     Mental Status: She is alert and oriented to person, place, and time.     Cranial Nerves: No cranial nerve deficit.  Psychiatric:        Mood and Affect: Mood normal.        Behavior: Behavior normal.        Thought Content: Thought content normal.     ED Results / Procedures / Treatments   Labs (all labs ordered are listed, but only abnormal results are displayed) Results for orders placed or performed during the hospital encounter of 06/24/20  I-stat chem 8, ED (not at [facility] or [facility])  Result Value Ref Range   Sodium 140 135 - 145 mmol/L   Potassium 3.9 3.5 - 5.1 mmol/L   Chloride 102 98 - 111 mmol/L   BUN 11 6 - 20 mg/dL   Creatinine, Ser 0.60 0.44 - 1.00 mg/dL   Glucose, Bld 99 70 - 99 mg/dL   Calcium, Ion 1.17 1.15 - 1.40 mmol/L   TCO2 27 22 - 32 mmol/L   Hemoglobin 13.9 12.0 - 15.0 g/dL   HCT 41.0 36.0 - 46.0 %   Laboratory interpretation all normal     EKG None  Radiology No results found.  Procedures Procedures (including critical care time)  Medications Ordered in ED Medications  loperamide (IMODIUM) capsule 4 mg (4 mg Oral Given 06/24/20 [id number])    ED Course  I have reviewed the triage vital signs and the nursing notes.  Pertinent labs &  imaging results that were available during my care of the patient were reviewed by me and considered in my medical decision making (see chart for details).    MDM Rules/Calculators/A&P                         Patient was given Imodium 4 mg orally.  I-STAT 8 was done to make sure her potassium is okay.  COVID testing was done.  Patient can be discharged home, she has no hypoxia or shortness of breath or chest symptoms.  If she needs potassium she can take oral potassium pills and she will be advised what to take for her COVID illness.  Patient is i-STAT 8 returned at her potassium was normal.  She was  discharged home to take Imodium over-the-counter.  She can check in MyChart later today to get the results of her COVID test.  She can start taking zinc, vitamin D and C, an aspirin 81 mg a day.  Jasmin Chen was evaluated in Emergency Department on 06/24/2020 for the symptoms described in the history of present illness. She was evaluated in the context of the global COVID-19 pandemic, which necessitated consideration that the patient might be at risk for infection with the SARS-CoV-2 virus that causes COVID-19. Institutional protocols and algorithms that pertain to the evaluation of patients at risk for COVID-19 are in a state of rapid change based on information released by regulatory bodies including the CDC and federal and state organizations. These policies and algorithms were followed during the patient's care in the ED.   Final Clinical Impression(s) / ED Diagnoses Final diagnoses:  Suspected COVID-19 virus infection    Rx / DC Orders ED Discharge Orders    None     Plan discharge  Rolland Porter, MD, Barbette Or, MD 06/24/20 [id number]

## 2020-07-18 ENCOUNTER — Telehealth: Payer: Self-pay

## 2020-07-18 NOTE — Telephone Encounter (Signed)
Called pt to get scheduled for orthotic pick up. LVM 07/09/2020, unable to LVM 07/18/2020(VM full, msg sent through [place]).

## 2020-07-19 NOTE — Telephone Encounter (Signed)
Pt being scheduled through [employer]. Closing encounter.

## 2020-07-31 ENCOUNTER — Encounter: Payer: BC Managed Care – PPO | Admitting: Podiatry

## 2020-08-17 ENCOUNTER — Other Ambulatory Visit: Payer: Self-pay | Admitting: Gastroenterology

## 2020-08-17 DIAGNOSIS — R109 Unspecified abdominal pain: Secondary | ICD-10-CM

## 2020-08-17 DIAGNOSIS — K921 Melena: Secondary | ICD-10-CM

## 2020-08-17 DIAGNOSIS — R933 Abnormal findings on diagnostic imaging of other parts of digestive tract: Secondary | ICD-10-CM

## 2020-08-28 ENCOUNTER — Ambulatory Visit: Payer: BC Managed Care – PPO | Admitting: Podiatry

## 2020-08-28 ENCOUNTER — Ambulatory Visit (INDEPENDENT_AMBULATORY_CARE_PROVIDER_SITE_OTHER): Payer: BC Managed Care – PPO | Admitting: Podiatry

## 2020-08-28 ENCOUNTER — Other Ambulatory Visit: Payer: Self-pay

## 2020-08-28 ENCOUNTER — Encounter: Payer: Self-pay | Admitting: Podiatry

## 2020-08-28 DIAGNOSIS — R2242 Localized swelling, mass and lump, left lower limb: Secondary | ICD-10-CM

## 2020-08-28 DIAGNOSIS — M722 Plantar fascial fibromatosis: Secondary | ICD-10-CM

## 2020-08-28 MED ORDER — MELOXICAM 15 MG PO TABS: 15.0000 mg | ORAL_TABLET | Freq: Every day | ORAL | 1 refills | Status: DC

## 2020-08-28 NOTE — Patient Instructions (Signed)

## 2020-08-28 NOTE — Progress Notes (Signed)
Patient presents today for orthotic pick up. Patient voices no new complaints.   Orthotics were fitted to patient's feet. No discomfort and no rubbing. Patient satisfied with the orthotics.   Orthotics were dispensed to patient with instructions for break in wear and to call the office if any concerns or questions.  

## 2020-08-28 NOTE — Patient Instructions (Signed)

## 2020-09-01 NOTE — Progress Notes (Signed)
  Subjective:  Patient ID: Jasmin Chen, female    DOB: 04-12-1981,  MRN: [id number]  Chief Complaint  Patient presents with  . Foot Pain    "It hurts.  I been wearing Dr. Zoe Lan but I can tell a difference between these orthotics and the Dr. Zoe Lan.  The knot has also been bothering me."    40 y.o. female returns with the above complaint. History confirmed with patient.  Pain is not much better.  She received the orthotics today  Objective:  Physical Exam: warm, good capillary refill, no trophic changes or ulcerative lesions, normal DP and PT pulses and normal sensory exam. Left Foot: She has sharp pain at the calcaneal insertion of the plantar fascia, along the mid substance of the medial band of the plantar fascia.    Radiographs: X-ray of : Small plantar calcaneal enthesophyte Assessment:   1. Plantar fasciitis of left foot      Plan:  Patient was evaluated and treated and all questions answered.  Discussed the etiology and treatment options for plantar fasciitis including stretching, formal physical therapy, supportive shoegears such as a running shoe or sneaker, pre fabricated orthoses, injection therapy, and oral medications. We also discussed the role of surgical treatment of this for patients who do not improve after exhausting non-surgical treatment options.     -Continue stretching and icing of the affected limb -Injection delivered to the plantar fascia of the left foot. -Continue meloxicam. Educated on use, risks and benefits of the medication  -I examined her orthotics for fit and function and she was pleased with the feel of them.  Reviewed break-in period.  I think this will help quite a bit.  K  After sterile prep with povidone-iodine solution and alcohol, the left heel was injected with 1cc 2% xylocaine plain,  10 mg triamcinolone acetonide, and 4 mg dexamethasone was injected along the plantar fascia at the insertion on the plantar calcaneus. The patient  tolerated the procedure well without complication.    Return in about 6 weeks (around 10/09/2020) for recheck plantar fasciitis.

## 2020-10-09 ENCOUNTER — Encounter: Payer: Self-pay | Admitting: Podiatry

## 2020-10-09 ENCOUNTER — Other Ambulatory Visit: Payer: Self-pay

## 2020-10-09 ENCOUNTER — Ambulatory Visit: Payer: BC Managed Care – PPO | Admitting: Podiatry

## 2020-10-09 DIAGNOSIS — M79672 Pain in left foot: Secondary | ICD-10-CM

## 2020-10-09 DIAGNOSIS — M722 Plantar fascial fibromatosis: Secondary | ICD-10-CM | POA: Diagnosis not present

## 2020-10-09 NOTE — Progress Notes (Signed)
  Subjective:  Patient ID: Jasmin Chen, female    DOB: 1980/09/21,  MRN: [id number]  Chief Complaint  Patient presents with  . Plantar Fasciitis    "its worse.  The side of my foot is swollen, feels like a knot and the orthotics seem to make it worse"    40 y.o. female returns with the above complaint. History confirmed with patient.  Pain feels even worse.  She has not been able to wear the orthotics.  She is very tired of this and would like to look into the options  Objective:  Physical Exam: warm, good capillary refill, no trophic changes or ulcerative lesions, normal DP and PT pulses and normal sensory exam. Left Foot: She has sharp pain at the calcaneal insertion of the plantar fascia, along the mid substance of the medial band of the plantar fascia.  Worse than last time  Radiographs: X-ray of : Small plantar calcaneal enthesophyte Assessment:   1. Plantar fasciitis of left foot      Plan:  Patient was evaluated and treated and all questions answered.  This point we have tried everything we can including physical therapy, stretching icing, anti-inflammatories, immobilization and bracing and custom molded orthotics.  I recommend she stay in the CAM boot which was dispensed today.  I will order MRI for surgical planning to decide on EPF versus heel spur resection.  I will see her back in 3 weeks to consent for surgery.  We are planning tentatively for June 3 10th or 17th for surgery.    No follow-ups on file.

## 2020-10-14 ENCOUNTER — Other Ambulatory Visit: Payer: Self-pay | Admitting: Family Medicine

## 2020-10-14 ENCOUNTER — Telehealth: Payer: Self-pay

## 2020-10-14 ENCOUNTER — Ambulatory Visit
Admission: EM | Admit: 2020-10-14 | Discharge: 2020-10-14 | Disposition: A | Discharge status: Home / Self Care | Payer: BC Managed Care – PPO | Attending: Family Medicine | Admitting: Family Medicine

## 2020-10-14 ENCOUNTER — Other Ambulatory Visit: Payer: Self-pay

## 2020-10-14 DIAGNOSIS — R6889 Other general symptoms and signs: Secondary | ICD-10-CM | POA: Diagnosis not present

## 2020-10-14 DIAGNOSIS — U071 COVID-19: Secondary | ICD-10-CM | POA: Diagnosis not present

## 2020-10-14 MED ORDER — PROMETHAZINE-DM 6.25-15 MG/5ML PO SYRP: 5.0000 mL | ORAL_SOLUTION | Freq: Four times a day (QID) | ORAL | 0 refills | Status: DC | PRN

## 2020-10-14 MED ORDER — NIRMATRELVIR/RITONAVIR (PAXLOVID)TABLET: 2.0000 | ORAL_TABLET | Freq: Two times a day (BID) | ORAL | 0 refills | Status: DC

## 2020-10-14 MED ORDER — ALBUTEROL SULFATE (2.5 MG/3ML) 0.083% IN NEBU: 2.5000 mg | INHALATION_SOLUTION | RESPIRATORY_TRACT | 5 refills | Status: AC | PRN

## 2020-10-14 NOTE — ED Provider Notes (Signed)
[facility]    CSN: [id number] Arrival date & time: 10/14/20  1036      History   Chief Complaint Chief Complaint  Patient presents with  . Nasal Congestion    HPI Jasmin Chen is a 40 y.o. female.   HPI  Patient presents today for confirmatory PCR COVID test.  Patient took a home test which resulted as positive for COVID-19.  She endorses URI symptoms of sinus congestion, nasal drainage, fatigue, and fever.  Symptoms have been present for 3 days. She she is also requesting antiviral therapy for COVID.   High risk for complications related to COVID-19 infection include asthma, obesity, and hypertension.  She denies any chest pain, wheezing, or shortness of breath.  Past Medical History:  Diagnosis Date  . Asthma   . Chronic knee pain   . Chronic rhinitis   . Hidradenitis suppurativa   . Hypertension   . IBS (irritable bowel syndrome)   . Migraine without aura 2009  . Multiple environmental allergies   . Other and unspecified ovarian cyst 02/06/2013  . Ovarian cyst   . Papanicolaou smear of cervix with positive high risk human papilloma virus (HPV) test 07/20/2017  . Right leg weakness    chronic    Patient Active Problem List   Diagnosis Date Noted  . RLQ abdominal pain 05/20/2020  . Right ovarian cyst 05/20/2020  . Cyst of left ovary 10/10/2019  . Encounter for gynecological examination with Papanicolaou smear of cervix 10/10/2019  . Enlarged ovary 09/22/2019  . Papanicolaou smear of cervix with positive high risk human papilloma virus (HPV) test 07/20/2017  . Rapid palpitations 04/26/2017  . History of syncope 04/26/2017  . Syncope 07/12/2015  . 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 Left ovarian cyst 03/11/2014  . Ovarian cyst 03/11/2014  . Knee pain 10/10/2013  . Stiffness of joint, not elsewhere classified, lower leg 10/10/2013  . Weakness of right leg 10/10/2013  . Other and unspecified ovarian cyst 03/20/2013  . Other and unspecified ovarian cyst  02/06/2013  . Other and unspecified ovarian cyst 02/06/2013  . IBS (irritable bowel syndrome) 02/03/2013  . Acid reflux 09/28/2012  . HLD (hyperlipidemia) 09/28/2012  . Benign hypertension 09/28/2012  . Hidradenitis suppurativa 09/19/2012  . Morbid obesity ([place]) 09/19/2012  . RECTAL BLEEDING 08/20/2010  . ABDOMINAL PAIN-LLQ 08/20/2010    Past Surgical History:  Procedure Laterality Date  . 2 cysts removed from arm    . ABDOMINAL HYSTERECTOMY    . BREAST REDUCTION SURGERY    . deviated septum repair    . hemroid surgery    . LAPAROSCOPIC GASTRIC SLEEVE RESECTION    . LAPAROSCOPIC UNILATERAL SALPINGO OOPHERECTOMY Right 06/12/2020   Procedure: LAPAROSCOPIC UNILATERAL SALPINGO OOPHORECTOMY;  Surgeon: Florian Buff, MD;  Location: [facility];  Service: Gynecology;  Laterality: Right;    OB History    Gravida  0   Para  0   Term  0   Preterm  0   AB  0   Living  0     SAB  0   IAB  0   Ectopic  0   Multiple  0   Live Births  0            Home Medications    Prior to Admission medications   Medication Sig Start Date End Date Taking? Authorizing Provider  acetaminophen (TYLENOL) 500 MG tablet Take 500-1,000 mg by mouth every 6 (six) hours as needed (pain.).  [provider]  albuterol (PROVENTIL) (2.5 MG/3ML) 0.083% nebulizer solution Take 3 mLs (2.5 mg total) by nebulization every 4 (four) hours as needed for wheezing or shortness of breath. 10/03/18   Kathyrn Drown, MD  albuterol (VENTOLIN HFA) 108 (90 Base) MCG/ACT inhaler Inhale 2 puffs into the lungs every 4 (four) hours as needed for wheezing or shortness of breath. 10/03/18   Kathyrn Drown, MD  ALPRAZolam (XANAX) 0.25 MG tablet TAKE 1/2 TO 1 TABLET BY MOUTH TWICE A DAY AS NEEDED ANXIETY Patient taking differently: Take 0.125-0.25 mg by mouth 2 (two) times daily as needed for anxiety. 09/18/19   Kathyrn Drown, MD  fluticasone (FLONASE) 50 MCG/ACT nasal spray Place 2 sprays into both nostrils  daily. 06/26/17   Waynetta Pean, PA-C  ketorolac (TORADOL) 10 MG tablet Take 1 tablet (10 mg total) by mouth every 8 (eight) hours as needed. 06/12/20   Florian Buff, MD  loratadine (CLARITIN) 10 MG tablet Take 1 tablet (10 mg total) by mouth daily. 06/26/17   Waynetta Pean, PA-C  meloxicam (MOBIC) 15 MG tablet Take 1 tablet (15 mg total) by mouth daily. 08/28/20   Criselda Peaches, DPM  Multiple Vitamin (MULTIVITAMIN WITH MINERALS) TABS tablet Take 1 tablet by mouth daily.    [provider]  naproxen (NAPROSYN) 500 MG tablet Take 1 tablet (500 mg total) by mouth 2 (two) times daily. Patient taking differently: Take 500 mg by mouth 2 (two) times daily as needed (pain.). 05/14/20   Khatri, Hina, PA-C  omeprazole (PRILOSEC) 20 MG capsule Take 20 mg by mouth daily as needed (Acid Reflux).    [provider]    Family History Family History  Problem Relation Age of Onset  . Hypertension Mother   . Diabetes Mother   . Hyperlipidemia Mother   . Hypertension Father   . Hyperlipidemia Father   . Heart attack Father   . Sarcoidosis Sister   . Thyroid disease Sister   . Hyperlipidemia Sister   . Hypertension Sister   . Colon cancer Neg Hx   . Stomach cancer Neg Hx   . Pancreatic cancer Neg Hx   . Rectal cancer Neg Hx   . Esophageal cancer Neg Hx     Social History Social History   Tobacco Use  . Smoking status: Never Smoker  . Smokeless tobacco: Never Used  Vaping Use  . Vaping Use: Never used  Substance Use Topics  . Alcohol use: No  . Drug use: No     Allergies   Prednisone   Review of Systems Review of Systems Pertinent negatives listed in HPI  Physical Exam Triage Vital Signs ED Triage Vitals [10/14/20 1303]  Enc Vitals Group     BP 120/81     Pulse Rate 89     Resp 18     Temp 98.7 F (37.1 C)     Temp Source Oral     SpO2 98 %     Weight      Height      Head Circumference      Peak Flow      Pain Score      Pain Loc      Pain  Edu?      Excl. in [place]?    No data found.  Updated Vital Signs BP 120/81   Pulse 89   Temp 98.7 F (37.1 C) (Oral)   Resp 18   SpO2 98%   Visual  Acuity Right Eye Distance:   Left Eye Distance:   Bilateral Distance:    Right Eye Near:   Left Eye Near:    Bilateral Near:     Physical Exam  General appearance: alert, Ill-appearing, no distress Head: Normocephalic, without obvious abnormality, atraumatic ENT: Ears normal, Nose: mucosal edema, congestion, Throat:  oropharynx w/o exudate Respiratory: Respirations even , unlabored, coarse lung sound, expiratory wheeze Heart: rate and rhythm normal. No gallop or murmurs noted on exam  Abdomen: BS +, no distention, no rebound tenderness, or no mass Extremities: No gross deformities Skin: Skin color, texture, turgor normal. No rashes seen  Psych: Appropriate mood and affect. Neurologic: GCS 15, normal coordination normal gait UC Treatments / Results  Labs (all labs ordered are listed, but only abnormal results are displayed) Labs Reviewed - No data to display  EKG   Radiology No results found.  Procedures Procedures (including critical care time)  Medications Ordered in UC Medications - No data to display  Initial Impression / Assessment and Plan / UC Course  I have reviewed the triage vital signs and the nursing notes.  Pertinent labs & imaging results that were available during my care of the patient were reviewed by me and considered in my medical decision making (see chart for details).     Patient with positive COVID-19 home test, confirmatory PCR pending given patient's clinical symptoms . Paxlovid prescribed, recent GFR reviewed, patient has not u/l renal disease. Refilled albuterol neb. Promethazine for cough. Red flag indicators warranting ER evaluation discussed.  Final Clinical Impressions(s) / UC Diagnoses   Final diagnoses:  COVID-19 virus infection   Discharge Instructions   None    ED  Prescriptions    Medication Sig Dispense Auth. Provider   albuterol (PROVENTIL) (2.5 MG/3ML) 0.083% nebulizer solution Take 3 mLs (2.5 mg total) by nebulization every 4 (four) hours as needed for wheezing or shortness of breath. 75 mL Scot Jun, FNP   promethazine-dextromethorphan (PROMETHAZINE-DM) 6.25-15 MG/5ML syrup Take 5 mLs by mouth 4 (four) times daily as needed for cough. 140 mL Scot Jun, FNP   nirmatrelvir/ritonavir EUA (PAXLOVID) TABS Take 2 tablets by mouth 2 (two) times daily for 5 days. Patient GFR is >60 Take nirmatrelvir (150 mg) 2 tablet(s) twice daily for 5 days and ritonavir (100 mg) one tablet twice daily for 5 days. 20 tablet Scot Jun, FNP     PDMP not reviewed this encounter.   Scot Jun, [place] 10/20/20 [phone]

## 2020-10-14 NOTE — ED Triage Notes (Signed)
Pt presents with nasal congestion and sinus pressure since Saturday

## 2020-10-15 LAB — COVID-19, FLU A+B NAA
Influenza A, NAA: NOT DETECTED
Influenza B, NAA: NOT DETECTED
SARS-CoV-2, NAA: DETECTED — AB

## 2020-10-21 ENCOUNTER — Other Ambulatory Visit: Payer: Self-pay

## 2020-10-21 ENCOUNTER — Ambulatory Visit
Admission: RE | Admit: 2020-10-21 | Discharge: 2020-10-21 | Disposition: A | Discharge status: Home / Self Care | Payer: BC Managed Care – PPO | Source: Ambulatory Visit | Attending: Podiatry | Admitting: Podiatry

## 2020-10-21 DIAGNOSIS — R600 Localized edema: Secondary | ICD-10-CM | POA: Diagnosis not present

## 2020-10-21 DIAGNOSIS — M7989 Other specified soft tissue disorders: Secondary | ICD-10-CM | POA: Diagnosis not present

## 2020-10-21 DIAGNOSIS — M79672 Pain in left foot: Secondary | ICD-10-CM

## 2020-10-21 DIAGNOSIS — M722 Plantar fascial fibromatosis: Secondary | ICD-10-CM | POA: Diagnosis not present

## 2020-10-25 ENCOUNTER — Telehealth: Payer: Self-pay | Admitting: Urology

## 2020-10-25 NOTE — Telephone Encounter (Signed)
DOS - 11/15/20  EPF LEFT --- 09381  BCBS EFFECTIVE DATE - 11/18/19   PLAN DEDUCTIBLE - $1,000.00 W/ $707.00 REMAINING OUT OF POCKET - $6,000.00 W/ $5,643.00 REMAINING COINSURANCE - 0% COPAY - $0.00   SPOKE WITH JEQUANDA M. WITH [employer] AND SHE STATED THAT FOR CPT CODE 82993 NO PRIOR AUTH IS REQUIRED.   REF # [id number]

## 2020-10-30 ENCOUNTER — Other Ambulatory Visit: Payer: Self-pay

## 2020-10-30 ENCOUNTER — Ambulatory Visit (INDEPENDENT_AMBULATORY_CARE_PROVIDER_SITE_OTHER): Payer: BC Managed Care – PPO | Admitting: Podiatry

## 2020-10-30 DIAGNOSIS — M79672 Pain in left foot: Secondary | ICD-10-CM | POA: Diagnosis not present

## 2020-10-30 DIAGNOSIS — M722 Plantar fascial fibromatosis: Secondary | ICD-10-CM | POA: Diagnosis not present

## 2020-11-03 ENCOUNTER — Encounter: Payer: Self-pay | Admitting: Podiatry

## 2020-11-03 NOTE — Progress Notes (Signed)
Subjective:  Patient ID: Jasmin Chen, female    DOB: 1980/10/01,  MRN: [id number]  Chief Complaint  Patient presents with  . Plantar Fasciitis      PF / MRI RESULTS/ CONSULT FOR SURGERY    40 y.o. female returns with the above complaint. History confirmed with patient.  She completed the MRI and is here for her presurgical planning visit  Objective:  Physical Exam: warm, good capillary refill, no trophic changes or ulcerative lesions, normal DP and PT pulses and normal sensory exam. Left Foot: She has sharp pain at the calcaneal insertion of the plantar fascia, along the mid substance of the medial band of the plantar fascia.  Worse than last time  Radiographs: X-ray of : Small plantar calcaneal enthesophyte  Study Result  Narrative & Impression  CLINICAL DATA:  Chronic left ankle pain.  EXAM: MRI OF THE LEFT FOOT WITHOUT CONTRAST  TECHNIQUE: Multiplanar, multisequence MR imaging of the left ankle was performed. No intravenous contrast was administered.  COMPARISON:  Left foot x-rays dated April 04, 2020.  FINDINGS: TENDONS  Peroneal: Peroneal longus tendon intact. Peroneal brevis intact.  Posteromedial: Posterior tibial tendon intact. Flexor digitorum longus tendon intact. Flexor hallucis longus tendon intact.  Anterior: Tibialis anterior tendon intact. Extensor hallucis longus tendon intact Extensor digitorum longus tendon intact.  Achilles:  Intact.  Plantar Fascia: Intact. Mild thickening of the proximal central band with increased intermediate signal and mild surrounding soft tissue swelling. Minimal reactive marrow edema in the adjacent calcaneus.  LIGAMENTS  Lateral: Anterior talofibular ligament intact. Calcaneofibular ligament intact. Posterior talofibular ligament intact. Anterior and posterior tibiofibular ligaments intact.  Medial: Deltoid ligament intact. Spring ligament intact.  CARTILAGE  Ankle Joint: No joint effusion.  Normal ankle mortise. No chondral defect.  Subtalar Joints/Sinus Tarsi: Normal subtalar joints. No subtalar joint effusion. Normal sinus tarsi.  Bones: No marrow signal abnormality.  No fracture or dislocation.  Soft Tissue: No soft tissue mass or fluid collection. Prominent focal edema in the flexor digitorum brevis muscle. Milder, more diffuse edema in the abductor digiti minimi muscle.  IMPRESSION: 1. Mild plantar fasciitis. 2. Flexor digitorum brevis muscle strain. 3. 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 No atrophy.   Electronically Signed   By: Titus Dubin M.D.   On: 10/21/2020 16:12    Assessment:   1. Plantar fasciitis of left foot   2. Left foot pain      Plan:  Patient was evaluated and treated and all questions answered.  I independently reviewed the MRI images.  I reviewed the results of the report and my interpretation with the patient.  She has plantar fasciitis without significant bone marrow edema in the calcaneus at the insertion site.  I recommend endoscopic plantar fasciotomy.  She does not have significant equinus either.  We discussed the risk, benefits and potential complications including but not limited to pain, swelling, infection, scar, numbness which may be temporary or permanent, chronic pain, stiffness, nerve pain or damage, wound healing problems.  Informed consent was signed reviewed and surgery scheduled for June 3.   Surgical plan:  Procedure: -EPF left foot  Location: -[facility]  Anesthesia plan: -IV sedation with local anesthesia  Postoperative pain plan: - Tylenol 1000 mg every 6 hours, ibuprofen 600 mg every 6 hours, gabapentin 300 mg every 8 hours x5 days, oxycodone 5 mg 1-2 tabs every 6 hours only as needed  DVT prophylaxis: -None required  WB Restrictions / DME needs: -  WBAT in CAM boot     Return for after surgery .

## 2020-11-15 ENCOUNTER — Other Ambulatory Visit: Payer: Self-pay | Admitting: Podiatry

## 2020-11-15 DIAGNOSIS — M722 Plantar fascial fibromatosis: Secondary | ICD-10-CM | POA: Diagnosis not present

## 2020-11-15 MED ORDER — OXYCODONE-ACETAMINOPHEN 5-325 MG PO TABS: 1.0000 | ORAL_TABLET | Freq: Four times a day (QID) | ORAL | 0 refills | Status: AC | PRN

## 2020-11-15 NOTE — Progress Notes (Signed)
11/15/20 EPF left foot

## 2020-11-20 ENCOUNTER — Encounter: Payer: Self-pay | Admitting: Podiatry

## 2020-11-20 ENCOUNTER — Ambulatory Visit (INDEPENDENT_AMBULATORY_CARE_PROVIDER_SITE_OTHER): Payer: BC Managed Care – PPO | Admitting: Podiatry

## 2020-11-20 ENCOUNTER — Other Ambulatory Visit: Payer: Self-pay

## 2020-11-20 VITALS — BP 127/81 | HR 73 | Temp 97.1°F

## 2020-11-20 DIAGNOSIS — M722 Plantar fascial fibromatosis: Secondary | ICD-10-CM

## 2020-11-20 DIAGNOSIS — M79672 Pain in left foot: Secondary | ICD-10-CM

## 2020-11-20 NOTE — Progress Notes (Signed)
  Subjective:  Patient ID: Jasmin Chen, female    DOB: 24-Jan-1981,  MRN: [id number]  Chief Complaint  Patient presents with  . Routine Post Op    POV #1 DOS 11/15/2020 EPF LT    DOS: 11/15/20 Procedure: Endoscopic plantar fasciotomy left foot  40 y.o. female returns for post-op check.  She is doing well she was having significant pain the first couple days but is doing much better now.  Not taking ibuprofen.  Review of Systems: Negative except as noted in the HPI. Denies N/V/F/Ch.   Objective:   Vitals:   11/20/20 1319  BP: 127/81  Pulse: 73  Temp: (!) 97.1 F (36.2 C)   There is no height or weight on file to calculate BMI. Constitutional Well developed. Well nourished.  Vascular Foot warm and well perfused. Capillary refill normal to all digits.   Neurologic Normal speech. Oriented to person, place, and time. Epicritic sensation to light touch grossly present bilaterally.  Dermatologic Skin healing well without signs of infection. Skin edges well coapted without signs of infection.  Orthopedic: Tenderness to palpation noted about the surgical site.    Assessment:   1. Plantar fasciitis of left foot   2. Left foot pain    Plan:  Patient was evaluated and treated and all questions answered.  S/p foot surgery left -Progressing as expected post-operatively. -WB Status: WBAT in CAM boot -Sutures: Removed in 2 weeks. -Medications: Ibuprofen as needed -Foot redressed with Band-Aid and Ace wrap she may begin to shower and bathe normally  Return in about 2 weeks (around 12/04/2020) for Suture removal.

## 2020-11-27 ENCOUNTER — Ambulatory Visit (INDEPENDENT_AMBULATORY_CARE_PROVIDER_SITE_OTHER): Payer: BC Managed Care – PPO | Admitting: Podiatry

## 2020-11-27 ENCOUNTER — Other Ambulatory Visit: Payer: Self-pay

## 2020-11-27 ENCOUNTER — Encounter: Payer: Self-pay | Admitting: Podiatry

## 2020-11-27 DIAGNOSIS — M722 Plantar fascial fibromatosis: Secondary | ICD-10-CM

## 2020-11-27 NOTE — Patient Instructions (Signed)
Can start back at [employer] in 2 weeks in [place] in 4 weeks in shoes   In 2 weeks start trying regular shoes again for short distances / small amounts of time  Next week begin your exercises again:    EXERCISES- RANGE OF MOTION (ROM) AND STRETCHING EXERCISES - Plantar Fasciitis (Heel Spur Syndrome) These exercises may help you when beginning to rehabilitate your injury. Your symptoms may resolve with or without further involvement from your physician, physical therapist or athletic trainer. While completing these exercises, remember:  Restoring tissue flexibility helps normal motion to return to the joints. This allows healthier, less painful movement and activity. An effective stretch should be held for at least 30 seconds. A stretch should never be painful. You should only feel a gentle lengthening or release in the stretched tissue.  RANGE OF MOTION - Toe Extension, Flexion Sit with your right / left leg crossed over your opposite knee. Grasp your toes and gently pull them back toward the top of your foot. You should feel a stretch on the bottom of your toes and/or foot. Hold this stretch for 10 seconds. Now, gently pull your toes toward the bottom of your foot. You should feel a stretch on the top of your toes and or foot. Hold this stretch for 10 seconds. Repeat  times. Complete this stretch 3 times per day.   RANGE OF MOTION - Ankle Dorsiflexion, Active Assisted Remove shoes and sit on a chair that is preferably not on a carpeted surface. Place right / left foot under knee. Extend your opposite leg for support. Keeping your heel down, slide your right / left foot back toward the chair until you feel a stretch at your ankle or calf. If you do not feel a stretch, slide your bottom forward to the edge of the chair, while still keeping your heel down. Hold this stretch for 10 seconds. Repeat 3 times. Complete this stretch 2 times per day.   STRETCH  Gastroc, Standing Place  hands on wall. Extend right / left leg, keeping the front knee somewhat bent. Slightly point your toes inward on your back foot. Keeping your right / left heel on the floor and your knee straight, shift your weight toward the wall, not allowing your back to arch. You should feel a gentle stretch in the right / left calf. Hold this position for 10 seconds. Repeat 3 times. Complete this stretch 2 times per day.  STRETCH  Soleus, Standing Place hands on wall. Extend right / left leg, keeping the other knee somewhat bent. Slightly point your toes inward on your back foot. Keep your right / left heel on the floor, bend your back knee, and slightly shift your weight over the back leg so that you feel a gentle stretch deep in your back calf. Hold this position for 10 seconds. Repeat 3 times. Complete this stretch 2 times per day.  STRETCH  Gastrocsoleus, Standing  Note: This exercise can place a lot of stress on your foot and ankle. Please complete this exercise only if specifically instructed by your caregiver.  Place the ball of your right / left foot on a step, keeping your other foot firmly on the same step. Hold on to the wall or a rail for balance. Slowly lift your other foot, allowing your body weight to press your heel down over the edge of the step. You should feel a stretch in your right / left calf. Hold this position for 10  seconds. Repeat this exercise with a slight bend in your right / left knee. Repeat 3 times. Complete this stretch 2 times per day.   STRENGTHENING EXERCISES - Plantar Fasciitis (Heel Spur Syndrome)  These exercises may help you when beginning to rehabilitate your injury. They may resolve your symptoms with or without further involvement from your physician, physical therapist or athletic trainer. While completing these exercises, remember:  Muscles can gain both the endurance and the strength needed for everyday activities through controlled exercises. Complete  these exercises as instructed by your physician, physical therapist or athletic trainer. Progress the resistance and repetitions only as guided.  STRENGTH - Towel Curls Sit in a chair positioned on a non-carpeted surface. Place your foot on a towel, keeping your heel on the floor. Pull the towel toward your heel by only curling your toes. Keep your heel on the floor. Repeat 3 times. Complete this exercise 2 times per day.  STRENGTH - Ankle Inversion Secure one end of a rubber exercise band/tubing to a fixed object (table, pole). Loop the other end around your foot just before your toes. Place your fists between your knees. This will focus your strengthening at your ankle. Slowly, pull your big toe up and in, making sure the band/tubing is positioned to resist the entire motion. Hold this position for 10 seconds. Have your muscles resist the band/tubing as it slowly pulls your foot back to the starting position. Repeat 3 times. Complete this exercises 2 times per day.  Document Released: 06/01/2005 Document Revised: 08/24/2011 Document Reviewed: 09/13/2008 [facility] Patient Information 2014 [place], [place].

## 2020-11-27 NOTE — Progress Notes (Signed)
  Subjective:  Patient ID: Jasmin Chen, female    DOB: 1981/05/07,  MRN: [id number]  Chief Complaint  Patient presents with   Routine Post Op    POV #2 DOS 11/15/2020 EPF LT  "its still hurts.  It feels like a really bad sprain when I walk"    DOS: 11/15/20 Procedure: Endoscopic plantar fasciotomy left foot  40 y.o. female returns for post-op check.  Still pretty sore especially if she is on her feet in the boot  Review of Systems: Negative except as noted in the HPI. Denies N/V/F/Ch.   Objective:   There were no vitals filed for this visit.  There is no height or weight on file to calculate BMI. Constitutional Well developed. Well nourished.  Vascular Foot warm and well perfused. Capillary refill normal to all digits.   Neurologic Normal speech. Oriented to person, place, and time. Epicritic sensation to light touch grossly present bilaterally.  Dermatologic Skin healing well without signs of infection. Skin edges well coapted without signs of infection.  Orthopedic: Tenderness to palpation noted about the surgical site.    Assessment:   1. Plantar fasciitis of left foot     Plan:  Patient was evaluated and treated and all questions answered.  S/p foot surgery left -Progressing as expected post-operatively. -WB Status: WBAT in CAM boot for 2 more weeks.  Plan to transition to shoe gear in 4 weeks -Restart therapy exercises next week -Think she should build to go back to work at [employer] in 2 weeks in the [place] in 4 weeks -Sutures: Removed all today -Medications: Ibuprofen as needed  Return in about 4 weeks (around 12/25/2020) for follow up from plantar fascia surgery .

## 2020-12-11 ENCOUNTER — Encounter: Payer: BC Managed Care – PPO | Admitting: Podiatry

## 2020-12-17 ENCOUNTER — Telehealth: Payer: Self-pay | Admitting: *Deleted

## 2020-12-17 NOTE — Telephone Encounter (Signed)
I called and left Jasmin Chen a message asking her when she is going to return to work from her surgical leave.  I am working on her FMLA that is due by 12/22/2020.

## 2020-12-25 ENCOUNTER — Ambulatory Visit (INDEPENDENT_AMBULATORY_CARE_PROVIDER_SITE_OTHER): Payer: BC Managed Care – PPO | Admitting: Podiatry

## 2020-12-25 ENCOUNTER — Other Ambulatory Visit: Payer: Self-pay

## 2020-12-25 ENCOUNTER — Encounter: Payer: Self-pay | Admitting: Podiatry

## 2020-12-25 DIAGNOSIS — M722 Plantar fascial fibromatosis: Secondary | ICD-10-CM

## 2020-12-25 DIAGNOSIS — M79672 Pain in left foot: Secondary | ICD-10-CM

## 2020-12-25 NOTE — Progress Notes (Signed)
  Subjective:  Patient ID: Jasmin Chen, female    DOB: [birth year],  MRN: [id number]  Chief Complaint  Patient presents with   Routine Post Op      POV #3 DOS 11/15/2020 EPF LT - Return in about 4 weeks (around 12/25/2020) for follow up from plantar fascia surgery .    DOS: 11/15/20 Procedure: Endoscopic plantar fasciotomy left foot  40 y.o. female returns for post-op check.  She is out of the boot has been improving and is better but still has decent amount of pain and swelling if she is on her feet for more than 30 minutes at a time Review of Systems: Negative except as noted in the HPI. Denies N/V/F/Ch.   Objective:   There were no vitals filed for this visit.  There is no height or weight on file to calculate BMI. Constitutional Well developed. Well nourished.  Vascular Foot warm and well perfused. Capillary refill normal to all digits.   Neurologic Normal speech. Oriented to person, place, and time. Epicritic sensation to light touch grossly present bilaterally.  Dermatologic Incision well-healed  Orthopedic: Tenderness to palpation noted about the surgical site.    Assessment:   1. Plantar fasciitis of left foot   2. Left foot pain     Plan:  Patient was evaluated and treated and all questions answered.  S/p foot surgery left -Overall continues to improve.  I think she still having some postoperative pain and swelling from being on her feet some.  Needs to really for her to go back to work and wrote her a work note for another 4 weeks.  Continue to increase her activity and twice daily therapy exercises.  Return in about 4 weeks (around 01/22/2021) for post op (no x-rays).

## 2020-12-26 ENCOUNTER — Telehealth: Payer: Self-pay | Admitting: *Deleted

## 2020-12-26 NOTE — Telephone Encounter (Signed)
I attempted to call the patient to inform her that we only received one page of her FMLA paperwork.  I asked her to re-fax it.

## 2020-12-30 NOTE — Telephone Encounter (Signed)
I am calling to let you know that I only received one page of your disability forms.  "They didn't tell me that one page went through.  They were having problems with their fax machine.  I was going to go over there today and try it again."

## 2021-01-03 ENCOUNTER — Telehealth: Payer: Self-pay | Admitting: *Deleted

## 2021-01-03 NOTE — Telephone Encounter (Signed)
"  I'm calling to follow up to make sure you got all 5 pages of my disability paperwork.  Please give me a call."

## 2021-01-08 DIAGNOSIS — M79676 Pain in unspecified toe(s): Secondary | ICD-10-CM

## 2021-01-22 ENCOUNTER — Ambulatory Visit (INDEPENDENT_AMBULATORY_CARE_PROVIDER_SITE_OTHER): Payer: BC Managed Care – PPO | Admitting: Podiatry

## 2021-01-22 ENCOUNTER — Encounter: Payer: Self-pay | Admitting: Podiatry

## 2021-01-22 ENCOUNTER — Other Ambulatory Visit: Payer: Self-pay

## 2021-01-22 DIAGNOSIS — M722 Plantar fascial fibromatosis: Secondary | ICD-10-CM

## 2021-01-22 NOTE — Progress Notes (Signed)
  Subjective:  Patient ID: Jasmin Chen, female    DOB: [birth year],  MRN: [id number]  Chief Complaint  Patient presents with   Routine Post Op    POV #4 DOS 11/15/20 EPF left    DOS: 11/15/20 Procedure: Endoscopic plantar fasciotomy left foot  40 y.o. female returns for post-op check.  Still having some tenderness but overall has improved quite a bit is much better than it was prior to surgery   Review of Systems: Negative except as noted in the HPI. Denies N/V/F/Ch.   Objective:   There were no vitals filed for this visit.  There is no height or weight on file to calculate BMI. Constitutional Well developed. Well nourished.  Vascular Foot warm and well perfused. Capillary refill normal to all digits.   Neurologic Normal speech. Oriented to person, place, and time. Epicritic sensation to light touch grossly present bilaterally.  Dermatologic Incision well-healed  Orthopedic: Tenderness to palpation noted about the surgical site much improved since last visit.    Assessment:   1. Plantar fasciitis of left foot     Plan:  Patient was evaluated and treated and all questions answered.  S/p foot surgery left -Overall continues to improve.  Advance to okay for her to go back to work at this point increase her activity I dispensed a gel pad for cushioning  Return in about 8 weeks (around 03/19/2021) for recheck plantar fasciitis.

## 2021-02-06 ENCOUNTER — Encounter: Payer: Self-pay | Admitting: *Deleted

## 2021-03-19 ENCOUNTER — Other Ambulatory Visit: Payer: Self-pay

## 2021-03-19 ENCOUNTER — Encounter: Payer: Self-pay | Admitting: Podiatry

## 2021-03-19 ENCOUNTER — Ambulatory Visit (INDEPENDENT_AMBULATORY_CARE_PROVIDER_SITE_OTHER): Payer: BC Managed Care – PPO | Admitting: Podiatry

## 2021-03-19 DIAGNOSIS — G5762 Lesion of plantar nerve, left lower limb: Secondary | ICD-10-CM | POA: Diagnosis not present

## 2021-03-19 DIAGNOSIS — M5432 Sciatica, left side: Secondary | ICD-10-CM | POA: Diagnosis not present

## 2021-03-19 MED ORDER — GABAPENTIN 300 MG PO CAPS: ORAL_CAPSULE | ORAL | 1 refills | Status: AC

## 2021-03-24 NOTE — Progress Notes (Signed)
  Subjective:  Patient ID: Jasmin Chen, female    DOB: September 10, 1980,  MRN: [id number]  Chief Complaint  Patient presents with   Plantar Fasciitis    "It never stopped hurting from surgery.  My ankle gets really sore after working all day.  It almost feels like a sprain"    DOS: 11/15/20 Procedure: Endoscopic plantar fasciotomy left foot  40 y.o. female returns for post-op check.  Continues still have quite a bit of pain although it is different from the pain she is having prior to surgery.  Most of its of burning tingling pain in the plantar heel   Review of Systems: Negative except as noted in the HPI. Denies N/V/F/Ch.   Objective:   There were no vitals filed for this visit.  There is no height or weight on file to calculate BMI. Constitutional Well developed. Well nourished.  Vascular Foot warm and well perfused. Capillary refill normal to all digits.   Neurologic Normal speech. Oriented to person, place, and time. Epicritic sensation to light touch grossly present bilaterally.  She has pain to palpation along the tarsal tunnel and the abductor hallucis origin of the lateral plantar nerve  Dermatologic Incision well-healed  Orthopedic: Tenderness to palpation noted about the surgical site much improved since last visit.    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 Sciatica of left side     Plan:  Patient was evaluated and treated and all questions answered.  S/p foot surgery left -Her plantar fasciitis symptoms have resolved and the pain she has now I think is likely possible lateral plantar nerve entrapment or Baxters neuritis.  I prescribed her gabapentin and will send her a neurosurgery spine referral she does have a history of low back problems in her back has given her issues with the type of job she has.  We may consider an injection next time along the nerve.  We also could consider nerve conduction studies if not improving.  Return in about  1 month (around 04/19/2021) for re-check nerve pain left foot.

## 2021-04-23 ENCOUNTER — Ambulatory Visit: Payer: BC Managed Care – PPO | Admitting: Podiatry

## 2021-04-23 ENCOUNTER — Telehealth: Payer: Self-pay | Admitting: Podiatry

## 2021-04-23 NOTE — Telephone Encounter (Signed)
She said she still have not got a call from neurosurgery spine referral  you said you was giving her

## 2021-04-30 ENCOUNTER — Ambulatory Visit: Payer: BC Managed Care – PPO | Admitting: Podiatry

## 2021-05-02 DIAGNOSIS — M5416 Radiculopathy, lumbar region: Secondary | ICD-10-CM | POA: Diagnosis not present

## 2021-05-14 ENCOUNTER — Ambulatory Visit: Payer: BC Managed Care – PPO | Admitting: Podiatry

## 2021-05-21 ENCOUNTER — Ambulatory Visit: Payer: BC Managed Care – PPO | Admitting: Podiatry

## 2021-06-03 ENCOUNTER — Ambulatory Visit: Payer: BC Managed Care – PPO | Admitting: Podiatry

## 2021-06-04 ENCOUNTER — Ambulatory Visit: Payer: BC Managed Care – PPO | Admitting: Podiatry

## 2021-06-24 DIAGNOSIS — M545 Low back pain, unspecified: Secondary | ICD-10-CM | POA: Diagnosis not present

## 2021-06-24 DIAGNOSIS — M5416 Radiculopathy, lumbar region: Secondary | ICD-10-CM | POA: Diagnosis not present

## 2021-06-24 DIAGNOSIS — M47816 Spondylosis without myelopathy or radiculopathy, lumbar region: Secondary | ICD-10-CM | POA: Diagnosis not present

## 2021-07-04 DIAGNOSIS — R200 Anesthesia of skin: Secondary | ICD-10-CM | POA: Diagnosis not present

## 2021-08-20 ENCOUNTER — Emergency Department (HOSPITAL_COMMUNITY)
Admission: EM | Admit: 2021-08-20 | Discharge: 2021-08-20 | Disposition: A | Discharge status: Home / Self Care | Payer: BC Managed Care – PPO | Attending: Emergency Medicine | Admitting: Emergency Medicine

## 2021-08-20 ENCOUNTER — Encounter (HOSPITAL_COMMUNITY): Payer: Self-pay

## 2021-08-20 ENCOUNTER — Emergency Department (HOSPITAL_COMMUNITY): Payer: BC Managed Care – PPO

## 2021-08-20 ENCOUNTER — Other Ambulatory Visit: Payer: Self-pay

## 2021-08-20 DIAGNOSIS — I10 Essential (primary) hypertension: Secondary | ICD-10-CM | POA: Diagnosis not present

## 2021-08-20 DIAGNOSIS — S4992XA Unspecified injury of left shoulder and upper arm, initial encounter: Secondary | ICD-10-CM | POA: Diagnosis not present

## 2021-08-20 DIAGNOSIS — S299XXA Unspecified injury of thorax, initial encounter: Secondary | ICD-10-CM | POA: Insufficient documentation

## 2021-08-20 DIAGNOSIS — Y92410 Unspecified street and highway as the place of occurrence of the external cause: Secondary | ICD-10-CM | POA: Diagnosis not present

## 2021-08-20 DIAGNOSIS — M542 Cervicalgia: Secondary | ICD-10-CM | POA: Diagnosis not present

## 2021-08-20 DIAGNOSIS — Z041 Encounter for examination and observation following transport accident: Secondary | ICD-10-CM | POA: Diagnosis not present

## 2021-08-20 DIAGNOSIS — M25512 Pain in left shoulder: Secondary | ICD-10-CM | POA: Diagnosis not present

## 2021-08-20 DIAGNOSIS — M25562 Pain in left knee: Secondary | ICD-10-CM | POA: Diagnosis not present

## 2021-08-20 DIAGNOSIS — S199XXA Unspecified injury of neck, initial encounter: Secondary | ICD-10-CM | POA: Diagnosis not present

## 2021-08-20 MED ORDER — LIDOCAINE 5 % EX PTCH: 1.0000 | MEDICATED_PATCH | CUTANEOUS | 0 refills | Status: DC

## 2021-08-20 MED ORDER — NAPROXEN 500 MG PO TABS
500.0000 mg | ORAL_TABLET | Freq: Two times a day (BID) | ORAL | 0 refills | Status: DC
Start: 2021-08-20 — End: 2021-08-20

## 2021-08-20 MED ORDER — NAPROXEN 500 MG PO TABS: 500.0000 mg | ORAL_TABLET | Freq: Two times a day (BID) | ORAL | 0 refills | Status: DC

## 2021-08-20 MED ORDER — IBUPROFEN 400 MG PO TABS
600.0000 mg | ORAL_TABLET | Freq: Once | ORAL | Status: AC
  Administered 2021-08-20: 600 mg via ORAL
  Filled 2021-08-20: qty 2
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
## 2021-08-23 ENCOUNTER — Encounter: Payer: Self-pay | Admitting: Emergency Medicine

## 2021-08-23 ENCOUNTER — Other Ambulatory Visit: Payer: Self-pay

## 2021-08-23 ENCOUNTER — Ambulatory Visit
Admission: EM | Admit: 2021-08-23 | Discharge: 2021-08-23 | Disposition: A | Discharge status: Home / Self Care | Payer: BC Managed Care – PPO | Attending: Family Medicine | Admitting: Family Medicine

## 2021-08-23 DIAGNOSIS — S161XXD Strain of muscle, fascia and tendon at neck level, subsequent encounter: Secondary | ICD-10-CM | POA: Diagnosis not present

## 2021-08-23 MED ORDER — CYCLOBENZAPRINE HCL 10 MG PO TABS: 10.0000 mg | ORAL_TABLET | Freq: Three times a day (TID) | ORAL | 0 refills | Status: DC | PRN
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
## 2021-09-04 ENCOUNTER — Ambulatory Visit: Payer: BC Managed Care – PPO | Admitting: Nurse Practitioner

## 2021-09-04 ENCOUNTER — Encounter: Payer: Self-pay | Admitting: Nurse Practitioner

## 2021-09-04 ENCOUNTER — Other Ambulatory Visit: Payer: Self-pay

## 2021-09-04 VITALS — BP 124/80 | HR 81 | Temp 98.4°F | Ht 60.0 in | Wt 211.0 lb

## 2021-09-04 DIAGNOSIS — R1032 Left lower quadrant pain: Secondary | ICD-10-CM

## 2021-09-04 DIAGNOSIS — R202 Paresthesia of skin: Secondary | ICD-10-CM

## 2021-09-04 DIAGNOSIS — M542 Cervicalgia: Secondary | ICD-10-CM | POA: Diagnosis not present

## 2021-09-04 DIAGNOSIS — M546 Pain in thoracic spine: Secondary | ICD-10-CM

## 2021-09-04 DIAGNOSIS — R200 Anesthesia of skin: Secondary | ICD-10-CM | POA: Diagnosis not present

## 2021-09-04 MED ORDER — CYCLOBENZAPRINE HCL 10 MG PO TABS: 10.0000 mg | ORAL_TABLET | Freq: Three times a day (TID) | ORAL | 0 refills | Status: AC | PRN
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
## 2021-09-04 NOTE — Addendum Note (Signed)
Addended by: Claire Shown on: 09/04/2021 03:30 PM ? ? Modules accepted: Orders ? ?

## 2021-09-11 ENCOUNTER — Encounter: Payer: Self-pay | Admitting: Nurse Practitioner

## 2021-09-11 ENCOUNTER — Ambulatory Visit: Payer: BC Managed Care – PPO | Admitting: Nurse Practitioner

## 2021-09-11 VITALS — BP 110/74 | HR 86 | Temp 98.2°F | Ht 60.0 in | Wt 211.0 lb

## 2021-09-11 DIAGNOSIS — M546 Pain in thoracic spine: Secondary | ICD-10-CM

## 2021-09-11 DIAGNOSIS — M542 Cervicalgia: Secondary | ICD-10-CM

## 2021-09-11 DIAGNOSIS — R202 Paresthesia of skin: Secondary | ICD-10-CM

## 2021-09-11 DIAGNOSIS — R200 Anesthesia of skin: Secondary | ICD-10-CM

## 2021-09-11 DIAGNOSIS — R1032 Left lower quadrant pain: Secondary | ICD-10-CM
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
## 2021-09-15 DIAGNOSIS — R200 Anesthesia of skin: Secondary | ICD-10-CM | POA: Diagnosis not present

## 2021-09-15 DIAGNOSIS — R030 Elevated blood-pressure reading, without diagnosis of hypertension: Secondary | ICD-10-CM | POA: Diagnosis not present

## 2021-09-15 DIAGNOSIS — Z6841 Body Mass Index (BMI) 40.0 and over, adult: Secondary | ICD-10-CM | POA: Diagnosis not present

## 2021-09-17 ENCOUNTER — Other Ambulatory Visit: Payer: Self-pay | Admitting: Neurosurgery

## 2021-09-17 DIAGNOSIS — R200 Anesthesia of skin: Secondary | ICD-10-CM

## 2021-09-18 ENCOUNTER — Ambulatory Visit (HOSPITAL_COMMUNITY): Payer: BC Managed Care – PPO | Attending: Nurse Practitioner | Admitting: Physical Therapy

## 2021-09-18 ENCOUNTER — Encounter (HOSPITAL_COMMUNITY): Payer: Self-pay | Admitting: Physical Therapy

## 2021-09-18 DIAGNOSIS — R29898 Other symptoms and signs involving the musculoskeletal system: Secondary | ICD-10-CM | POA: Diagnosis not present

## 2021-09-18 DIAGNOSIS — M25512 Pain in left shoulder: Secondary | ICD-10-CM | POA: Insufficient documentation

## 2021-09-18 DIAGNOSIS — M546 Pain in thoracic spine: Secondary | ICD-10-CM | POA: Diagnosis not present

## 2021-09-18 DIAGNOSIS — M542 Cervicalgia: Secondary | ICD-10-CM | POA: Diagnosis not present

## 2021-09-18 DIAGNOSIS — R1032 Left lower quadrant pain: Secondary | ICD-10-CM | POA: Insufficient documentation

## 2021-09-18 DIAGNOSIS — M6281 Muscle weakness (generalized): Secondary | ICD-10-CM | POA: Insufficient documentation
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
## 2021-09-18 NOTE — Patient Instructions (Signed)
Access Code: [email or web address] ?URL: [email or web address] ?Date: 09/18/2021 ?Prepared by: Mitzi Hansen Lise Pincus ? ?Exercises ?- Seated Scapular Retraction  - 3 x daily - 7 x weekly - 3 sets - 10 reps ?

## 2021-09-24 ENCOUNTER — Ambulatory Visit (HOSPITAL_COMMUNITY): Payer: BC Managed Care – PPO

## 2021-09-24 ENCOUNTER — Encounter (HOSPITAL_COMMUNITY): Payer: Self-pay

## 2021-09-24 DIAGNOSIS — M25512 Pain in left shoulder: Secondary | ICD-10-CM

## 2021-09-24 DIAGNOSIS — M546 Pain in thoracic spine: Secondary | ICD-10-CM | POA: Diagnosis not present

## 2021-09-24 DIAGNOSIS — M542 Cervicalgia: Secondary | ICD-10-CM | POA: Diagnosis not present

## 2021-09-24 DIAGNOSIS — R29898 Other symptoms and signs involving the musculoskeletal system: Secondary | ICD-10-CM

## 2021-09-24 DIAGNOSIS — R1032 Left lower quadrant pain: Secondary | ICD-10-CM | POA: Diagnosis not present

## 2021-09-24 DIAGNOSIS — M6281 Muscle weakness (generalized): Secondary | ICD-10-CM | POA: Diagnosis not present
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
## 2021-09-26 ENCOUNTER — Encounter (HOSPITAL_COMMUNITY): Payer: BC Managed Care – PPO

## 2021-09-28 ENCOUNTER — Ambulatory Visit
Admission: RE | Admit: 2021-09-28 | Discharge: 2021-09-28 | Disposition: A | Discharge status: Home / Self Care | Payer: BC Managed Care – PPO | Source: Ambulatory Visit | Attending: Neurosurgery | Admitting: Neurosurgery

## 2021-09-28 DIAGNOSIS — M5022 Other cervical disc displacement, mid-cervical region, unspecified level: Secondary | ICD-10-CM | POA: Diagnosis not present

## 2021-09-28 DIAGNOSIS — R200 Anesthesia of skin: Secondary | ICD-10-CM

## 2021-10-08 ENCOUNTER — Encounter (HOSPITAL_COMMUNITY): Payer: BC Managed Care – PPO

## 2021-10-08 ENCOUNTER — Telehealth (HOSPITAL_COMMUNITY): Payer: Self-pay

## 2021-10-08 NOTE — Telephone Encounter (Signed)
No show, attempted to call with no answer and answering machine has not been set up. ? ?Ihor Austin, LPTA/CLT; CBIS ?[phone] ? ?

## 2021-10-15 ENCOUNTER — Ambulatory Visit (HOSPITAL_COMMUNITY): Payer: BC Managed Care – PPO | Attending: Nurse Practitioner | Admitting: Physical Therapy

## 2021-10-15 DIAGNOSIS — M25512 Pain in left shoulder: Secondary | ICD-10-CM

## 2021-10-15 DIAGNOSIS — M542 Cervicalgia: Secondary | ICD-10-CM | POA: Diagnosis not present

## 2021-10-15 DIAGNOSIS — M546 Pain in thoracic spine: Secondary | ICD-10-CM | POA: Diagnosis not present

## 2021-10-15 DIAGNOSIS — R29898 Other symptoms and signs involving the musculoskeletal system: Secondary | ICD-10-CM

## 2021-10-15 DIAGNOSIS — M6281 Muscle weakness (generalized): Secondary | ICD-10-CM
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
## 2021-10-20 DIAGNOSIS — U071 COVID-19: Secondary | ICD-10-CM | POA: Diagnosis not present

## 2021-10-20 DIAGNOSIS — Z20822 Contact with and (suspected) exposure to covid-19: Secondary | ICD-10-CM | POA: Diagnosis not present

## 2021-10-21 ENCOUNTER — Encounter (HOSPITAL_COMMUNITY): Payer: BC Managed Care – PPO | Admitting: Physical Therapy

## 2021-10-22 ENCOUNTER — Telehealth (HOSPITAL_COMMUNITY): Payer: Self-pay | Admitting: Physical Therapy

## 2021-10-22 NOTE — Telephone Encounter (Signed)
Tested positivei for Covid  she will call back to r/s missed appointments after Quarantine of 10 days ?

## 2021-10-23 ENCOUNTER — Encounter (HOSPITAL_COMMUNITY): Payer: BC Managed Care – PPO | Admitting: Physical Therapy

## 2021-10-23 ENCOUNTER — Ambulatory Visit: Payer: BC Managed Care – PPO | Admitting: Nurse Practitioner

## 2021-10-27 ENCOUNTER — Encounter (HOSPITAL_COMMUNITY): Payer: BC Managed Care – PPO | Admitting: Physical Therapy

## 2021-10-29 ENCOUNTER — Encounter (HOSPITAL_COMMUNITY): Payer: BC Managed Care – PPO | Admitting: Physical Therapy

## 2021-12-23 ENCOUNTER — Other Ambulatory Visit: Payer: Self-pay

## 2021-12-23 ENCOUNTER — Encounter: Payer: Self-pay | Admitting: Emergency Medicine

## 2021-12-23 ENCOUNTER — Ambulatory Visit
Admission: EM | Admit: 2021-12-23 | Discharge: 2021-12-23 | Disposition: A | Discharge status: Home / Self Care | Payer: BC Managed Care – PPO | Attending: Family Medicine | Admitting: Family Medicine

## 2021-12-23 DIAGNOSIS — J309 Allergic rhinitis, unspecified: Secondary | ICD-10-CM | POA: Diagnosis not present

## 2021-12-23 MED ORDER — AMOXICILLIN-POT CLAVULANATE 875-125 MG PO TABS: 1.0000 | ORAL_TABLET | Freq: Two times a day (BID) | ORAL | 0 refills | Status: DC

## 2021-12-23 MED ORDER — DEXAMETHASONE SODIUM PHOSPHATE 10 MG/ML IJ SOLN
10.0000 mg | Freq: Once | INTRAMUSCULAR | Status: AC
  Administered 2021-12-23: 10 mg via INTRAMUSCULAR

## 2021-12-23 NOTE — ED Provider Notes (Signed)
[facility]    CSN: [id number] Arrival date & time: 12/23/21  0856      History   Chief Complaint Chief Complaint  Patient presents with   Nasal Congestion   HPI Jasmin Chen is a 41 y.o. female.   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  She states she was wearing a mask but still feels like she had a lot of exposure to allergic irritants during this process.  Denies fever, chills, body aches, cough, chest pain, shortness of breath.  Takes an antihistamine and nasal spray daily but that has not seemed to help.  No known sick contacts recently.   Past Medical History:  Diagnosis Date   Asthma    Chronic knee pain    Chronic rhinitis    Hidradenitis suppurativa    Hypertension    IBS (irritable bowel syndrome)    Migraine without aura 2009   Multiple environmental allergies    Other and unspecified ovarian cyst 02/06/2013   Ovarian cyst    Papanicolaou smear of cervix with positive high risk human papilloma virus (HPV) test 07/20/2017   Right leg weakness    chronic    Patient Active Problem List   Diagnosis Date Noted   RLQ abdominal pain 05/20/2020   Right ovarian cyst 05/20/2020   Cyst of left ovary 10/10/2019   Encounter for gynecological examination with Papanicolaou smear of cervix 10/10/2019   Enlarged ovary 09/22/2019   Papanicolaou smear of cervix with positive high risk human papilloma virus (HPV) test 07/20/2017   Rapid palpitations 04/26/2017   History of syncope 04/26/2017   Syncope 07/12/2015   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   Left ovarian cyst 03/11/2014   Ovarian cyst 03/11/2014   Knee pain 10/10/2013   Stiffness of joint, not elsewhere classified, lower leg 10/10/2013   Weakness of right leg 10/10/2013   Other and unspecified ovarian cyst 03/20/2013   Other and unspecified ovarian cyst  02/06/2013   Other and unspecified ovarian cyst 02/06/2013   IBS (irritable bowel syndrome) 02/03/2013   Acid reflux 09/28/2012   HLD (hyperlipidemia) 09/28/2012   Benign hypertension 09/28/2012   Hidradenitis suppurativa 09/19/2012   Morbid obesity ([place]) 09/19/2012   RECTAL BLEEDING 08/20/2010   ABDOMINAL PAIN-LLQ 08/20/2010    Past Surgical History:  Procedure Laterality Date   2 cysts removed from arm     ABDOMINAL HYSTERECTOMY     BREAST REDUCTION SURGERY     deviated septum repair     hemroid surgery     LAPAROSCOPIC GASTRIC SLEEVE RESECTION     LAPAROSCOPIC UNILATERAL SALPINGO OOPHERECTOMY Right 06/12/2020   Procedure: LAPAROSCOPIC UNILATERAL SALPINGO OOPHORECTOMY;  Surgeon: Florian Buff, MD;  Location: [facility];  Service: Gynecology;  Laterality: Right;    OB History     Gravida  0   Para  0   Term  0   Preterm  0   AB  0   Living  0      SAB  0   IAB  0   Ectopic  0   Multiple  0   Live Births  0            Home Medications    Prior to Admission medications   Medication Sig Start Date End Date Taking? Authorizing Provider  amoxicillin-clavulanate (AUGMENTIN) 875-125 MG tablet Take 1 tablet  by mouth every 12 (twelve) hours. 12/23/21  Yes Volney American, PA-C  acetaminophen (TYLENOL) 500 MG tablet Take 500-1,000 mg by mouth every 6 (six) hours as needed (pain.).    [provider]  albuterol (PROVENTIL) (2.5 MG/3ML) 0.083% nebulizer solution Take 3 mLs (2.5 mg total) by nebulization every 4 (four) hours as needed for wheezing or shortness of breath. 10/14/20   Scot Jun, FNP  ALPRAZolam (XANAX) 0.25 MG tablet TAKE 1/2 TO 1 TABLET BY MOUTH TWICE A DAY AS NEEDED ANXIETY Patient taking differently: Take 0.125-0.25 mg by mouth 2 (two) times daily as needed for anxiety. 09/18/19   Kathyrn Drown, MD  cyclobenzaprine (FLEXERIL) 10 MG tablet Take 1 tablet (10 mg total) by mouth 3 (three) times daily as needed for muscle spasms. Do  not drink alcohol or drive while taking this medication.  May cause drowsiness. 09/04/21   Ameduite, Trenton Gammon, NP  fluticasone (FLONASE) 50 MCG/ACT nasal spray Place 2 sprays into both nostrils daily. 06/26/17   Waynetta Pean, PA-C  gabapentin (NEURONTIN) 300 MG capsule Take 1 capsule (300 mg total) by mouth at bedtime for 14 days, THEN 1 capsule (300 mg total) 2 (two) times daily for 14 days. 03/19/21 04/16/21  McDonald, Stephan Minister, DPM  lidocaine (LIDODERM) 5 % Place 1 patch onto the skin daily. Remove & Discard patch within 12 hours or as directed by MD 08/20/21   Redwine, Madison A, PA-C  loratadine (CLARITIN) 10 MG tablet Take 1 tablet (10 mg total) by mouth daily. 06/26/17   Waynetta Pean, PA-C  Multiple Vitamin (MULTIVITAMIN WITH MINERALS) TABS tablet Take 1 tablet by mouth daily.    [provider]  naproxen (NAPROSYN) 500 MG tablet Take 1 tablet (500 mg total) by mouth 2 (two) times daily. 08/20/21   Redwine, Madison A, PA-C  omeprazole (PRILOSEC) 20 MG capsule Take 20 mg by mouth daily as needed (Acid Reflux).    [provider]    Family History Family History  Problem Relation Age of Onset   Hypertension Mother    Diabetes Mother    Hyperlipidemia Mother    Hypertension Father    Hyperlipidemia Father    Heart attack Father    Sarcoidosis Sister    Thyroid disease Sister    Hyperlipidemia Sister    Hypertension Sister    Colon cancer Neg Hx    Stomach cancer Neg Hx    Pancreatic cancer Neg Hx    Rectal cancer Neg Hx    Esophageal cancer Neg Hx     Social History Social History   Tobacco Use   Smoking status: Never   Smokeless tobacco: Never  Vaping Use   Vaping Use: Never used  Substance Use Topics   Alcohol use: No   Drug use: No     Allergies   Prednisone   Review of Systems Review of Systems PER HPI  Physical Exam Triage Vital Signs ED Triage Vitals [12/23/21 0940]  Enc Vitals Group     BP 123/83     Pulse Rate 82     Resp 20     Temp  97.9 F (36.6 C)     Temp Source Oral     SpO2 99 %     Weight      Height      Head Circumference      Peak Flow      Pain Score 10     Pain Loc  Pain Edu?      Excl. in [place]?    No data found.  Updated Vital Signs BP 123/83 (BP Location: Right Arm)   Pulse 82   Temp 97.9 F (36.6 C) (Oral)   Resp 20   SpO2 99%   Visual Acuity Right Eye Distance:   Left Eye Distance:   Bilateral Distance:    Right Eye Near:   Left Eye Near:    Bilateral Near:     Physical Exam Vitals and nursing note reviewed.  Constitutional:      Appearance: Normal appearance.  HENT:     Head: Atraumatic.     Right Ear: Tympanic membrane and external ear normal.     Left Ear: Tympanic membrane and external ear normal.     Nose: Congestion present.     Comments: Significant erythema, edema to bilateral nasal turbinates    Mouth/Throat:     Mouth: Mucous membranes are moist.     Pharynx: Posterior oropharyngeal erythema present.  Eyes:     Extraocular Movements: Extraocular movements intact.     Conjunctiva/sclera: Conjunctivae normal.  Cardiovascular:     Rate and Rhythm: Normal rate and regular rhythm.     Heart sounds: Normal heart sounds.  Pulmonary:     Effort: Pulmonary effort is normal.     Breath sounds: Normal breath sounds. No wheezing.  Musculoskeletal:        General: Normal range of motion.     Cervical back: Normal range of motion and neck supple.  Skin:    General: Skin is warm and dry.  Neurological:     Mental Status: She is alert and oriented to person, place, and time.  Psychiatric:        Mood and Affect: Mood normal.        Thought Content: Thought content normal.    UC Treatments / Results  Labs (all labs ordered are listed, but only abnormal results are displayed) Labs Reviewed - No data to display  EKG   Radiology No results found.  Procedures Procedures (including critical care time)  Medications Ordered in UC Medications  dexamethasone  (DECADRON) injection 10 mg (10 mg Intramuscular Given 12/23/21 0955)    Initial Impression / Assessment and Plan / UC Course  I have reviewed the triage vital signs and the nursing notes.  Pertinent labs & imaging results that were available during my care of the patient were reviewed by me and considered in my medical decision making (see chart for details).     Suspect allergic sinusitis, treat with IM Decadron as she does not tolerate oral prednisone, sinus rinses, DayQuil, NyQuil, continued allergy regimen.  Augmentin sent in case worsening over the next 4 to 5 days despite all of these things.  Return for worsening symptoms.  Work note given.  Final Clinical Impressions(s) / UC Diagnoses   Final diagnoses:  Allergic sinusitis     Discharge Instructions      Do not take the antibiotic unless your symptoms worsen over the next 4 to 5 days.  Continue your antihistamine, nasal sprays, do sinus rinses at least twice daily and allow the steroid shot time to help with your symptoms.    ED Prescriptions     Medication Sig Dispense Auth. Provider   amoxicillin-clavulanate (AUGMENTIN) 875-125 MG tablet Take 1 tablet by mouth every 12 (twelve) hours. 14 tablet Volney American, [place]      PDMP not reviewed this encounter.   Volney American,  PA-C 12/23/21 1259

## 2021-12-23 NOTE — ED Triage Notes (Signed)
Pt reports nasal congestion, facial pressure, headache since Thursday. Pt reports has taken otc medication no relief.

## 2021-12-23 NOTE — Discharge Instructions (Signed)
Do not take the antibiotic unless your symptoms worsen over the next 4 to 5 days.  Continue your antihistamine, nasal sprays, do sinus rinses at least twice daily and allow the steroid shot time to help with your symptoms.

## 2022-01-15 ENCOUNTER — Encounter (HOSPITAL_COMMUNITY): Payer: Self-pay | Admitting: Physical Therapy

## 2022-01-15 NOTE — Therapy (Signed)
[place], [place], [address] Phone: [phone]   Fax:  [phone]  Patient Details  Name: Jasmin Chen MRN: [id number] Date of Birth: 11/20/1980 Referring Provider:  No ref. provider found  Encounter Date: 01/15/2022  PHYSICAL THERAPY DISCHARGE SUMMARY  Visits from Start of Care: 3  Current functional level related to goals / functional outcomes: Unknown as patient has not returned   Remaining deficits: Unknown as patient has not returned   Education / Equipment: HEP   Patient agrees to discharge. Patient goals were not met. Patient is being discharged due to not returning since the last visit.  7:24 AM, 01/15/22 Mearl Latin PT, DPT Physical Therapist at [facility] [place], [place], [address] Phone: [phone]   Fax:  [phone]

## 2022-02-02 ENCOUNTER — Telehealth: Payer: Self-pay | Admitting: Gastroenterology

## 2022-02-02 NOTE — Telephone Encounter (Signed)
Patient called in stating she had an episode of a moderate amount BRB after voiding & passing gas this morning. Over the weekend she did have some occasional abdominal pain, however it resolved. Bowel movements have been normal. Denies n/v. Pt was last seen for colon on 11/07/19 with Dr. Tarri Glenn & did have internal hemorrhoids at the time. Pt has been advised that she can use prep H suppositories/cream or recticare for irritation, and to be seen in ED if bleeding worsens. Follow up scheduled for 02/05/22 at 11:00 am with Jaclyn Shaggy, NP.

## 2022-02-02 NOTE — Telephone Encounter (Signed)
Patient called today stating she went to the restroom to pee around noon today.  She said she felt "wet" and when she wiped there was bright red fresh blood.  She had to keep wiping because it was bleeding so much.  Dr. Tarri Glenn' first OV isn't until 10/4 and she needs to be seen sooner rather than later.  Even the [facility] do not have anything anytime soon.  Can you please call patient and advise.  Thank you.

## 2022-02-05 ENCOUNTER — Encounter: Payer: Self-pay | Admitting: Nurse Practitioner

## 2022-02-05 ENCOUNTER — Ambulatory Visit: Payer: BC Managed Care – PPO | Admitting: Nurse Practitioner

## 2022-02-05 VITALS — BP 122/86 | HR 85 | Ht 60.0 in | Wt 213.0 lb

## 2022-02-05 DIAGNOSIS — K625 Hemorrhage of anus and rectum: Secondary | ICD-10-CM | POA: Diagnosis not present

## 2022-02-05 NOTE — Progress Notes (Signed)
Reviewed and agree with management plans. ? ?Alyannah Sanks L. Leyda Vanderwerf, MD, MPH  ?

## 2022-02-05 NOTE — Patient Instructions (Signed)
1) Apply a small amount of Desitin inside the anal opening and to the external anal area tid as needed for anal or hemorrhoidal irritation/bleeding.   2) Preparation H suppository insert one suppository into the rectum at bed time x 5 night   3) Benefiber 1 tablespoon daily   4) Take Miralax 1 capful mixed in 8 ounces of water at bed time for constipation as tolerated.  5) Contact our office if your rectal bleeding persists or worsens

## 2022-02-05 NOTE — Progress Notes (Signed)
02/05/2022 Jasmin Chen [id number] 02-23-1981   Chief Complaint: Rectal bleeding   History of Present Illness: Jasmin Chen is a 41 year old female with a past medical history of colon polyps.  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  She is known by Dr. Tarri Glenn.  She presents today for further evaluation regarding rectal bleeding.  On Monday, 02/02/2022, she passed a small amount of gas per the rectum but felt wetness in her undergarment.  She went to the bathroom and saw a small amount of bright red blood on her undergarment.  Later the same day, she passed a normal bowel movement and saw drops of blood on the toilet seat and on the toilet tissue.  She saw a small amount of bright red blood on the toilet tissue for the next 2 days and no blood after passing a BM earlier today.  She denies having any rectal pain.  She passes a solid stool 3 days weekly without straining.  No recent diet changes or new medications.  She underwent a colonoscopy by Dr. Tarri Glenn 11/07/2019 which identified 2 tubular adenomatous/sessile serrated polyps which were removed from the distal transverse colon and nonbleeding internal hemorrhoids.  She underwent hemorrhoidectomy surgery in 2016.  She takes 1-2 BC powders every other day for aches and pains.     Latest Ref Rng & Units 06/24/2020    2:34 AM 06/11/2020   11:28 AM 05/14/2020    4:15 PM  CBC  WBC 4.0 - 10.5 K/uL  6.0  6.3   Hemoglobin 12.0 - 15.0 g/dL 13.9  13.4  13.7   Hematocrit 36.0 - 46.0 % 41.0  41.2  41.8   Platelets 150 - 400 K/uL  264  271        Latest Ref Rng & Units 06/24/2020    2:34 AM 06/11/2020   11:28 AM 05/14/2020    4:15 PM  CMP  Glucose 70 - 99 mg/dL 99  115  92   BUN 6 - 20 mg/dL '11  9  10   '$ Creatinine 0.44 - 1.00 mg/dL 0.60  0.56  0.60   Sodium 135 - 145 mmol/L 140  136  136   Potassium 3.5 - 5.1 mmol/L 3.9  3.5  3.9   Chloride 98 - 111 mmol/L 102  103  101    CO2 22 - 32 mmol/L  25  25   Calcium 8.9 - 10.3 mg/dL  8.7  9.0   Total Protein 6.5 - 8.1 g/dL  7.5  7.4   Total Bilirubin 0.3 - 1.2 mg/dL  0.8  1.4   Alkaline Phos 38 - 126 U/L  47  48   AST 15 - 41 U/L  15  14   ALT 0 - 44 U/L  13  11     Colonoscopy 11/07/2019: - Two less than 1 mm polyps in the distal transverse colon, removed with a cold biopsy forceps. Resected and retrieved. - Non-bleeding internal hemorrhoids. - No source for recent symptoms identified on this examination. - 5 year colonoscopy recall Surgical [P], colon, transverse, polyp (2) - TUBULAR ADENOMA WITHOUT HIGH GRADE DYSPLASIA (X 1). - SESSILE SERRATED POLYP WITHOUT CYTOLOGIC DYSPLASIA (X 1).  Current Outpatient Medications on File Prior to Visit  Medication Sig Dispense Refill   acetaminophen (TYLENOL) 500 MG tablet Take 500-1,000 mg by mouth every 6 (six) hours as needed (pain.).     albuterol (PROVENTIL) (2.5 MG/3ML)  0.083% nebulizer solution Take 3 mLs (2.5 mg total) by nebulization every 4 (four) hours as needed for wheezing or shortness of breath. 75 mL 5   ALPRAZolam (XANAX) 0.25 MG tablet TAKE 1/2 TO 1 TABLET BY MOUTH TWICE A DAY AS NEEDED ANXIETY (Patient taking differently: Take 0.125-0.25 mg by mouth 2 (two) times daily as needed for anxiety.) 30 tablet 0   amoxicillin-clavulanate (AUGMENTIN) 875-125 MG tablet Take 1 tablet by mouth every 12 (twelve) hours. 14 tablet 0   cyclobenzaprine (FLEXERIL) 10 MG tablet Take 1 tablet (10 mg total) by mouth 3 (three) times daily as needed for muscle spasms. Do not drink alcohol or drive while taking this medication.  May cause drowsiness. 30 tablet 0   fluticasone (FLONASE) 50 MCG/ACT nasal spray Place 2 sprays into both nostrils daily. 16 g 0   lidocaine (LIDODERM) 5 % Place 1 patch onto the skin daily. Remove & Discard patch within 12 hours or as directed by MD 30 patch 0   loratadine (CLARITIN) 10 MG tablet Take 1 tablet (10 mg total) by mouth daily. 30 tablet 0    Multiple Vitamin (MULTIVITAMIN WITH MINERALS) TABS tablet Take 1 tablet by mouth daily.     naproxen (NAPROSYN) 500 MG tablet Take 1 tablet (500 mg total) by mouth 2 (two) times daily. 30 tablet 0   omeprazole (PRILOSEC) 20 MG capsule Take 20 mg by mouth daily as needed (Acid Reflux).     gabapentin (NEURONTIN) 300 MG capsule Take 1 capsule (300 mg total) by mouth at bedtime for 14 days, THEN 1 capsule (300 mg total) 2 (two) times daily for 14 days. 42 capsule 1   No current facility-administered medications on file prior to visit.   Allergies  Allergen Reactions   Prednisone Other (See Comments)    Difficulty concentrating and functioning    Current Medications, Allergies, Past Medical History, Past Surgical History, Family History and Social History were reviewed in [employer] record.  Review of Systems:   Constitutional: Negative for fever, sweats, chills or weight loss.  Respiratory: Negative for shortness of breath.   Cardiovascular: Negative for chest pain, palpitations and leg swelling.  Gastrointestinal: See HPI.  Musculoskeletal: + Chronic knee pain  Neurological: Negative for dizziness, headaches or paresthesias.   Physical Exam: BP 122/86   Pulse 85   Ht 5' (1.524 m)   Wt 213 lb (96.6 kg)   BMI 41.60 kg/m   General: 41 year old female in no acute distress. Head: Normocephalic and atraumatic. Eyes: No scleral icterus. Conjunctiva pink . Ears: Normal auditory acuity. Mouth: Dentition intact. No ulcers or lesions.  Lungs: Clear throughout to auscultation. Heart: Regular rate and rhythm, no murmur. Abdomen: Soft, nontender and nondistended. No masses or hepatomegaly. Normal bowel sounds x 4 quadrants.  Rectal: No external hemorrhoids or fissures.  Firm hard stool in the rectal vault.  Right lateral internal hemorrhoid palpated without prolapse. Patty RN present during exam Musculoskeletal: Symmetrical with no gross deformities. Extremities: No  edema. Neurological: Alert oriented x 4. No focal deficits.  Psychological: Alert and cooperative. Normal mood and affect  Assessment and Recommendations:  74) 41 year old female with rectal bleeding, likely from internal hemorrhoids -Drink 64 ounces water daily -Benefiber 1 tablespoon daily as tolerated -MiraLAX nightly -Apply a small amount of Desitin inside the anal opening and to the external anal area tid as needed for anal or hemorrhoidal irritation/bleeding.  -Preparation H suppository one PR Q HS x 5 nights (Anusol not prescribed secondary  to reported history of allergy to Prednisone) -Patient to contact office if rectal bleeding persists or worsens -Reduce BC powder use -Follow-up as needed  2) Chronic constipation -See plan in #1  3) History of 2 tubular adenomatous/sessile serrated polyps removed from the transverse colon per colonoscopy 10/2019 -Next colonoscopy due 10/2024  Today's encounter was 25 minutes which included precharting, chart/result review, history/exam, face-to-face time used for counseling, formulating treatment plan with follow-up and documentation.

## 2022-04-11 ENCOUNTER — Ambulatory Visit: Payer: BC Managed Care – PPO

## 2022-04-14 ENCOUNTER — Encounter (HOSPITAL_COMMUNITY): Payer: Self-pay | Admitting: *Deleted

## 2022-04-14 ENCOUNTER — Other Ambulatory Visit: Payer: Self-pay

## 2022-04-14 ENCOUNTER — Emergency Department (HOSPITAL_COMMUNITY)
Admission: EM | Admit: 2022-04-14 | Discharge: 2022-04-14 | Disposition: A | Discharge status: Home / Self Care | Payer: BC Managed Care – PPO | Attending: Emergency Medicine | Admitting: Emergency Medicine

## 2022-04-14 ENCOUNTER — Emergency Department (HOSPITAL_COMMUNITY): Payer: BC Managed Care – PPO

## 2022-04-14 DIAGNOSIS — Y990 Civilian activity done for income or pay: Secondary | ICD-10-CM | POA: Diagnosis not present

## 2022-04-14 DIAGNOSIS — W240XXA Contact with lifting devices, not elsewhere classified, initial encounter: Secondary | ICD-10-CM | POA: Insufficient documentation

## 2022-04-14 DIAGNOSIS — S0990XA Unspecified injury of head, initial encounter: Secondary | ICD-10-CM

## 2022-04-14 DIAGNOSIS — S0083XA Contusion of other part of head, initial encounter: Secondary | ICD-10-CM | POA: Insufficient documentation

## 2022-04-14 MED ORDER — HYDROMORPHONE HCL 1 MG/ML IJ SOLN
0.5000 mg | Freq: Once | INTRAMUSCULAR | Status: AC
  Administered 2022-04-14: 0.5 mg via SUBCUTANEOUS
  Filled 2022-04-14: qty 0.5

## 2022-04-14 MED ORDER — HYDROCODONE-ACETAMINOPHEN 5-325 MG PO TABS: ORAL_TABLET | ORAL | 0 refills | Status: DC

## 2022-04-14 NOTE — ED Provider Notes (Signed)
[facility] EMERGENCY DEPARTMENT Provider Note   CSN: [id number] Arrival date & time: 04/14/22  1706     History {Add pertinent medical, surgical, social history, OB history to HPI:1} Chief Complaint  Patient presents with   Head Injury    Jasmin Chen is a 41 y.o. female.  Patient walked into something hitting her head.  No loss of consciousness.  Patient has history of obesity.  She complaining of pain but no dizziness   Head Injury      Home Medications Prior to Admission medications   Medication Sig Start Date End Date Taking? Authorizing Provider  HYDROcodone-acetaminophen (NORCO/VICODIN) 5-325 MG tablet Take 1 every 6 hours for pain has not relieved by 800 mg of Motrin. 04/14/22  Yes Milton Ferguson, MD  acetaminophen (TYLENOL) 500 MG tablet Take 500-1,000 mg by mouth every 6 (six) hours as needed (pain.).    [provider]  albuterol (PROVENTIL) (2.5 MG/3ML) 0.083% nebulizer solution Take 3 mLs (2.5 mg total) by nebulization every 4 (four) hours as needed for wheezing or shortness of breath. 10/14/20   Scot Jun, FNP  ALPRAZolam (XANAX) 0.25 MG tablet TAKE 1/2 TO 1 TABLET BY MOUTH TWICE A DAY AS NEEDED ANXIETY Patient taking differently: Take 0.125-0.25 mg by mouth 2 (two) times daily as needed for anxiety. 09/18/19   Kathyrn Drown, MD  amoxicillin-clavulanate (AUGMENTIN) 875-125 MG tablet Take 1 tablet by mouth every 12 (twelve) hours. 12/23/21   Volney American, PA-C  cyclobenzaprine (FLEXERIL) 10 MG tablet Take 1 tablet (10 mg total) by mouth 3 (three) times daily as needed for muscle spasms. Do not drink alcohol or drive while taking this medication.  May cause drowsiness. 09/04/21   Ameduite, Trenton Gammon, NP  fluticasone (FLONASE) 50 MCG/ACT nasal spray Place 2 sprays into both nostrils daily. 06/26/17   Waynetta Pean, PA-C  gabapentin (NEURONTIN) 300 MG capsule Take 1 capsule (300 mg total) by mouth at bedtime for 14 days, THEN 1 capsule (300 mg  total) 2 (two) times daily for 14 days. 03/19/21 04/16/21  McDonald, Stephan Minister, DPM  lidocaine (LIDODERM) 5 % Place 1 patch onto the skin daily. Remove & Discard patch within 12 hours or as directed by MD 08/20/21   Redwine, Madison A, PA-C  loratadine (CLARITIN) 10 MG tablet Take 1 tablet (10 mg total) by mouth daily. 06/26/17   Waynetta Pean, PA-C  Multiple Vitamin (MULTIVITAMIN WITH MINERALS) TABS tablet Take 1 tablet by mouth daily.    [provider]  naproxen (NAPROSYN) 500 MG tablet Take 1 tablet (500 mg total) by mouth 2 (two) times daily. 08/20/21   Redwine, Madison A, PA-C  omeprazole (PRILOSEC) 20 MG capsule Take 20 mg by mouth daily as needed (Acid Reflux).    [provider]      Allergies    Prednisone    Review of Systems   Review of Systems  Physical Exam Updated Vital Signs Ht 5' (1.524 m)   Wt 90.7 kg   BMI 39.06 kg/m  Physical Exam  ED Results / Procedures / Treatments   Labs (all labs ordered are listed, but only abnormal results are displayed) Labs Reviewed - No data to display  EKG None  Radiology CT Head Wo Contrast  Result Date: 04/14/2022 CLINICAL DATA:  Trauma. EXAM: CT HEAD WITHOUT CONTRAST TECHNIQUE: Contiguous axial images were obtained from the base of the skull through the vertex without intravenous contrast. RADIATION DOSE REDUCTION: This exam was performed according to  the departmental dose-optimization program which includes automated exposure control, adjustment of the mA and/or kV according to patient size and/or use of iterative reconstruction technique. COMPARISON:  Head CT 03/12/2005 FINDINGS: Brain: No evidence of acute infarction, hemorrhage, hydrocephalus, extra-axial collection or mass lesion/mass effect. Note is made of an empty sella. Vascular: No hyperdense vessel or unexpected calcification. Skull: Normal. Negative for fracture or focal lesion. Sinuses/Orbits: No acute finding. Other: There is scalp soft tissue swelling in the  anterior right frontal region. There is no foreign body. IMPRESSION: No acute intracranial abnormality. Electronically Signed   By: Ronney Asters M.D.   On: 04/14/2022 18:14    Procedures Procedures  {Document cardiac monitor, telemetry assessment procedure when appropriate:1}  Medications Ordered in ED Medications  HYDROmorphone (DILAUDID) injection 0.5 mg (0.5 mg Subcutaneous Given 04/14/22 1738)    ED Course/ Medical Decision Making/ A&P  CT head negative                         Medical Decision Making Amount and/or Complexity of Data Reviewed Radiology: ordered.  Risk Prescription drug management.  Moderate contusion to forehead.  Patient will be discharged home and is given a prescription of Vicodin if Motrin alone does not help with the pain  {Document critical care time when appropriate:1} {Document review of labs and clinical decision tools ie heart score, Chads2Vasc2 etc:1}  {Document your independent review of radiology images, and any outside records:1} {Document your discussion with family members, caretakers, and with consultants:1} {Document social determinants of health affecting pt's care:1} {Document your decision making why or why not admission, treatments were needed:1} Final Clinical Impression(s) / ED Diagnoses Final diagnoses:  Injury of head, initial encounter    Rx / DC Orders ED Discharge Orders          Ordered    HYDROcodone-acetaminophen (NORCO/VICODIN) 5-325 MG tablet        04/14/22 1824

## 2022-04-14 NOTE — Discharge Instructions (Signed)
Take Tylenol or Motrin for pain.  If that does not work you can use a Vicodin.  Follow-up with your doctor if any problems

## 2022-04-14 NOTE — ED Triage Notes (Signed)
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

## 2022-04-24 ENCOUNTER — Encounter (HOSPITAL_COMMUNITY): Payer: Self-pay

## 2022-04-24 ENCOUNTER — Other Ambulatory Visit: Payer: Self-pay

## 2022-04-24 ENCOUNTER — Emergency Department (HOSPITAL_COMMUNITY)
Admission: EM | Admit: 2022-04-24 | Discharge: 2022-04-24 | Disposition: A | Discharge status: Home / Self Care | Payer: BC Managed Care – PPO | Attending: Emergency Medicine | Admitting: Emergency Medicine

## 2022-04-24 DIAGNOSIS — G44309 Post-traumatic headache, unspecified, not intractable: Secondary | ICD-10-CM | POA: Diagnosis not present

## 2022-04-24 DIAGNOSIS — R040 Epistaxis: Secondary | ICD-10-CM

## 2022-04-24 DIAGNOSIS — F0781 Postconcussional syndrome: Secondary | ICD-10-CM | POA: Diagnosis not present
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
## 2022-08-12 DIAGNOSIS — M545 Low back pain, unspecified: Secondary | ICD-10-CM | POA: Diagnosis not present

## 2023-01-11 DIAGNOSIS — Z20822 Contact with and (suspected) exposure to covid-19: Secondary | ICD-10-CM | POA: Diagnosis not present

## 2023-01-11 DIAGNOSIS — R519 Headache, unspecified: Secondary | ICD-10-CM | POA: Diagnosis not present

## 2023-01-11 DIAGNOSIS — J029 Acute pharyngitis, unspecified: Secondary | ICD-10-CM | POA: Diagnosis not present

## 2023-01-11 DIAGNOSIS — B349 Viral infection, unspecified: Secondary | ICD-10-CM | POA: Diagnosis not present

## 2023-01-19 ENCOUNTER — Other Ambulatory Visit: Payer: Self-pay

## 2023-01-19 ENCOUNTER — Encounter (HOSPITAL_COMMUNITY): Payer: Self-pay

## 2023-01-19 ENCOUNTER — Emergency Department (HOSPITAL_COMMUNITY): Payer: BC Managed Care – PPO

## 2023-01-19 ENCOUNTER — Emergency Department (HOSPITAL_COMMUNITY)
Admission: EM | Admit: 2023-01-19 | Discharge: 2023-01-19 | Disposition: A | Discharge status: Home / Self Care | Payer: BC Managed Care – PPO | Attending: Student | Admitting: Student

## 2023-01-19 DIAGNOSIS — J45909 Unspecified asthma, uncomplicated: Secondary | ICD-10-CM | POA: Insufficient documentation

## 2023-01-19 DIAGNOSIS — Y990 Civilian activity done for income or pay: Secondary | ICD-10-CM | POA: Diagnosis not present

## 2023-01-19 DIAGNOSIS — M545 Low back pain, unspecified: Secondary | ICD-10-CM | POA: Insufficient documentation

## 2023-01-19 DIAGNOSIS — S199XXA Unspecified injury of neck, initial encounter: Secondary | ICD-10-CM | POA: Insufficient documentation

## 2023-01-19 DIAGNOSIS — M79631 Pain in right forearm: Secondary | ICD-10-CM | POA: Diagnosis not present

## 2023-01-19 DIAGNOSIS — W208XXA Other cause of strike by thrown, projected or falling object, initial encounter: Secondary | ICD-10-CM | POA: Insufficient documentation

## 2023-01-19 DIAGNOSIS — Y92812 Truck as the place of occurrence of the external cause: Secondary | ICD-10-CM | POA: Diagnosis not present

## 2023-01-19 DIAGNOSIS — S0990XA Unspecified injury of head, initial encounter: Secondary | ICD-10-CM | POA: Diagnosis not present

## 2023-01-19 MED ORDER — IBUPROFEN 600 MG PO TABS: 600.0000 mg | ORAL_TABLET | Freq: Four times a day (QID) | ORAL | 0 refills | Status: DC | PRN

## 2023-01-19 MED ORDER — LIDOCAINE 5 % EX PTCH
1.0000 | MEDICATED_PATCH | CUTANEOUS | Status: DC
  Administered 2023-01-19: 1 via TRANSDERMAL
  Filled 2023-01-19: qty 1

## 2023-01-19 MED ORDER — PROCHLORPERAZINE EDISYLATE 10 MG/2ML IJ SOLN
10.0000 mg | Freq: Once | INTRAMUSCULAR | Status: AC
  Administered 2023-01-19: 10 mg via INTRAMUSCULAR
  Filled 2023-01-19: qty 2

## 2023-01-19 MED ORDER — METHOCARBAMOL 500 MG PO TABS
500.0000 mg | ORAL_TABLET | Freq: Two times a day (BID) | ORAL | 0 refills | Status: DC
Start: 2023-01-19 — End: 2023-11-24

## 2023-01-19 MED ORDER — LIDOCAINE 5 % EX PTCH: 1.0000 | MEDICATED_PATCH | CUTANEOUS | 0 refills | Status: DC

## 2023-01-19 MED ORDER — OXYCODONE-ACETAMINOPHEN 5-325 MG PO TABS
1.0000 | ORAL_TABLET | Freq: Once | ORAL | Status: AC
  Administered 2023-01-19: 1 via ORAL
  Filled 2023-01-19: qty 1

## 2023-01-19 NOTE — ED Provider Notes (Signed)
[place] EMERGENCY DEPARTMENT AT [facility] Provider Note   CSN: [id number] Arrival date & time: 01/19/23  1119     History Chief Complaint  Patient presents with   Back Pain    Jasmin Chen is a 42 y.o. female with h/o asthma presents to the ER for evaluation of head and neck injury early today around 0900. The patient reports that she was working with a Radio broadcast assistant bundles. She reports that the insulation bundles were not plastic wrapped together and one fell and rolled down her head, neck, and right arm. She reports that they weigh > 75 pounds. She denies any LOC or lightheadedness. She denies any visual changes or numbness/tingling. She denies any trouble walking or talking afterwards. Denies any diplopia. She is having a diffuse headache. Denies any chest, abdomen, or bilateral leg pain. She is not having and pain in her left arm but is having pain in her more proximal right forearm, but not elbow. She reports that the insulation hit her in the arm as well. She is complaining of some diffuse lower back pain, but denies any blunt trauma to the back. She did not have any falls. The patient had ibuprofen around 0930 before coming into the ER. She arrived via her mother and was ambulatory into the ER. She is allergic to prednisone. Denies taking a blood thinner.    Back Pain Associated symptoms: headaches   Associated symptoms: no abdominal pain, no chest pain, no dysuria, no fever, no numbness and no weakness        Home Medications Prior to Admission medications   Medication Sig Start Date End Date Taking? Authorizing Provider  ibuprofen (ADVIL) 600 MG tablet Take 1 tablet (600 mg total) by mouth every 6 (six) hours as needed. 01/19/23  Yes Achille Rich, PA-C  methocarbamol (ROBAXIN) 500 MG tablet Take 1 tablet (500 mg total) by mouth 2 (two) times daily. 01/19/23  Yes Achille Rich, PA-C  acetaminophen (TYLENOL) 500 MG tablet Take 500-1,000 mg by mouth every 6  (six) hours as needed (pain.).    [provider]  albuterol (PROVENTIL) (2.5 MG/3ML) 0.083% nebulizer solution Take 3 mLs (2.5 mg total) by nebulization every 4 (four) hours as needed for wheezing or shortness of breath. 10/14/20   Bing Neighbors, NP  ALPRAZolam (XANAX) 0.25 MG tablet TAKE 1/2 TO 1 TABLET BY MOUTH TWICE A DAY AS NEEDED ANXIETY Patient taking differently: Take 0.125-0.25 mg by mouth 2 (two) times daily as needed for anxiety. 09/18/19   Babs Sciara, MD  cyclobenzaprine (FLEXERIL) 10 MG tablet Take 1 tablet (10 mg total) by mouth 3 (three) times daily as needed for muscle spasms. Do not drink alcohol or drive while taking this medication.  May cause drowsiness. 09/04/21   Ameduite, Alvino Chapel, FNP  fluticasone (FLONASE) 50 MCG/ACT nasal spray Place 2 sprays into both nostrils daily. 06/26/17   Everlene Farrier, PA-C  gabapentin (NEURONTIN) 300 MG capsule Take 1 capsule (300 mg total) by mouth at bedtime for 14 days, THEN 1 capsule (300 mg total) 2 (two) times daily for 14 days. 03/19/21 04/16/21  McDonald, Rachelle Hora, DPM  lidocaine (LIDODERM) 5 % Place 1 patch onto the skin daily. Remove & Discard patch within 12 hours or as directed by MD 01/19/23   Achille Rich, PA-C  loratadine (CLARITIN) 10 MG tablet Take 1 tablet (10 mg total) by mouth daily. 06/26/17   Everlene Farrier, PA-C  Multiple Vitamin (MULTIVITAMIN WITH MINERALS)  TABS tablet Take 1 tablet by mouth daily.    [provider]  naproxen (NAPROSYN) 500 MG tablet Take 1 tablet (500 mg total) by mouth 2 (two) times daily. 08/20/21   Redwine, Madison A, PA-C  omeprazole (PRILOSEC) 20 MG capsule Take 20 mg by mouth daily as needed (Acid Reflux).    [provider]      Allergies    Prednisone    Review of Systems   Review of Systems  Constitutional:  Negative for chills and fever.  Eyes:  Negative for photophobia and visual disturbance.  Respiratory:  Negative for shortness of breath.   Cardiovascular:   Negative for chest pain.  Gastrointestinal:  Negative for abdominal distention, abdominal pain, constipation, diarrhea, nausea and vomiting.       Denies any fecal incontinence  Genitourinary:  Negative for dysuria and hematuria.       Denies any urinary incontinence or urinary retention.  Musculoskeletal:  Positive for back pain, myalgias and neck pain. Negative for neck stiffness.       Denies any saddle anesthesia  Neurological:  Positive for headaches. Negative for dizziness, syncope, speech difficulty, weakness, light-headedness and numbness.    Physical Exam Updated Vital Signs BP 128/68   Pulse 62   Temp 97.8 F (36.6 C) (Oral)   Resp 16   Ht 5' (1.524 m)   Wt 99.8 kg   SpO2 94%   BMI 42.97 kg/m  Physical Exam Vitals and nursing note reviewed.  Constitutional:      General: She is not in acute distress.    Appearance: She is not ill-appearing or toxic-appearing.  HENT:     Head: Normocephalic and atraumatic.     Comments: The patient was wearing a wig. After removal I do not see any acute abnormalities.  Her head is nontender to palpation.  I do not feel any step-offs or deformities.  She has no raccoon eyes or battle signs.    Right Ear: Tympanic membrane, ear canal and external ear normal.     Left Ear: Tympanic membrane, ear canal and external ear normal.     Ears:     Comments: No hemotympanums    Nose: Nose normal.     Mouth/Throat:     Mouth: Mucous membranes are moist.  Eyes:     General: No scleral icterus.    Extraocular Movements: Extraocular movements intact.     Pupils: Pupils are equal, round, and reactive to light.  Neck:     Comments: Some mild paraspinal tenderness to palpation R>L. No anterior or lateral neck tenderness to palpation. Full ROM with some pain. No step off or deformities. No signs of trauma.  Cardiovascular:     Rate and Rhythm: Normal rate.     Pulses:          Radial pulses are 2+ on the right side and 2+ on the left side.        Dorsalis pedis pulses are 2+ on the right side and 2+ on the left side.       Posterior tibial pulses are 2+ on the right side and 2+ on the left side.  Pulmonary:     Effort: Pulmonary effort is normal. No respiratory distress.     Breath sounds: Normal breath sounds.     Comments: Non tender to palpation. No signs of trauma.  Chest:     Chest wall: No tenderness.  Abdominal:     Palpations: Abdomen is soft.  Tenderness: There is no abdominal tenderness. There is no guarding or rebound.     Comments: No bruising or signs of trauma. Soft. Nontender.   Musculoskeletal:        General: Tenderness present. No deformity.       Arms:     Cervical back: Normal range of motion. Tenderness present.       Back:     Right lower leg: No edema.     Left lower leg: No edema.     Comments: Tenderness to the marked areas above.  No signs of trauma.  No step-offs or deformities.  Soft.  No overlying skin changes present.  She does not have any other midline cervical, thoracic tenderness.  She has 5 out of 5 strength in her upper and lower bilateral extremities.  For her right upper extremity, she does have some pain to the marked area above.  Does not have any pain in the elbow or with range of motion of the elbow or shoulder.  Compartments are soft.  Finger thumb opposition intact in all 5 fingers.  Cap refill brisk in all 5 fingers as well as a palpable radial pulse.  Equal grip strength bilaterally.  Sensation reportedly intact.  She does not have any other tenderness to the left arm, or bilateral lower extremities.  She is neurovascular intact throughout.  She is ambulatory without assistance.  Skin:    General: Skin is warm and dry.  Neurological:     General: No focal deficit present.     Mental Status: She is alert and oriented to person, place, and time. Mental status is at baseline.     GCS: GCS eye subscore is 4. GCS verbal subscore is 5. GCS motor subscore is 6.     Cranial Nerves: No  cranial nerve deficit, dysarthria or facial asymmetry.     Sensory: No sensory deficit.     Motor: No weakness or pronator drift.     Coordination: Finger-Nose-Finger Test normal.     Gait: Gait normal.     Comments: GCS 15.  Cranial nerves II through XII intact.  No facial asymmetry noted.  She is answering questions appropriately with appropriate speech.  Sensation reportedly intact and symmetric throughout per patient.  Strength is 5-5 in patient's upper and lower bilateral extremities.  No pronator drift.  Normal finger-nose-finger.  Normal gait.     ED Results / Procedures / Treatments   Labs (all labs ordered are listed, but only abnormal results are displayed) Labs Reviewed - No data to display  EKG None  Radiology CT Lumbar Spine Wo Contrast  Result Date: 01/19/2023 CLINICAL DATA:  Back trauma, no prior imaging (Age >= 16y) EXAM: CT LUMBAR SPINE WITHOUT CONTRAST TECHNIQUE: Multidetector CT imaging of the lumbar spine was performed without intravenous contrast administration. Multiplanar CT image reconstructions were also generated. RADIATION DOSE REDUCTION: This exam was performed according to the departmental dose-optimization program which includes automated exposure control, adjustment of the mA and/or kV according to patient size and/or use of iterative reconstruction technique. COMPARISON:  CT abdomen pelvis 09/14/2019 FINDINGS: Segmentation: 5 lumbar type vertebrae. Alignment: Normal. Vertebrae:. Mild degenerative changes. No acute fracture or focal pathologic process. Paraspinal and other soft tissues: Negative. Disc levels: Maintained. IMPRESSION: No acute displaced fracture or traumatic listhesis of the lumbar spine. Electronically Signed   By: Tish Frederickson M.D.   On: 01/19/2023 14:44   CT Head Wo Contrast  Result Date: 01/19/2023 CLINICAL DATA:  Head trauma,  moderate-severe; Neck trauma, dangerous injury mechanism (Age 105-64y) EXAM: CT HEAD WITHOUT CONTRAST CT CERVICAL  SPINE WITHOUT CONTRAST TECHNIQUE: Multidetector CT imaging of the head and cervical spine was performed following the standard protocol without intravenous contrast. Multiplanar CT image reconstructions of the cervical spine were also generated. RADIATION DOSE REDUCTION: This exam was performed according to the departmental dose-optimization program which includes automated exposure control, adjustment of the mA and/or kV according to patient size and/or use of iterative reconstruction technique. COMPARISON:  MRI cervical spine 09/28/2021, CT head 04/14/2022 FINDINGS: CT HEAD FINDINGS Brain: No evidence of large-territorial acute infarction. No parenchymal hemorrhage. No mass lesion. No extra-axial collection. No mass effect or midline shift. No hydrocephalus. Basilar cisterns are patent. Vascular: No hyperdense vessel. Skull: No acute fracture or focal lesion. Sinuses/Orbits: Paranasal sinuses and mastoid air cells are clear. The orbits are unremarkable. Other: None. CT CERVICAL SPINE FINDINGS Alignment: Reversal normal cervical lordosis due to positioning. Skull base and vertebrae: No acute fracture. No aggressive appearing focal osseous lesion or focal pathologic process. Soft tissues and spinal canal: No prevertebral fluid or swelling. No visible canal hematoma. Upper chest: Unremarkable. Other: None. IMPRESSION: 1. No acute intracranial abnormality. 2. No acute displaced fracture or traumatic listhesis of the cervical spine. Electronically Signed   By: Tish Frederickson M.D.   On: 01/19/2023 14:31   CT Cervical Spine Wo Contrast  Result Date: 01/19/2023 CLINICAL DATA:  Head trauma, moderate-severe; Neck trauma, dangerous injury mechanism (Age 29-64y) EXAM: CT HEAD WITHOUT CONTRAST CT CERVICAL SPINE WITHOUT CONTRAST TECHNIQUE: Multidetector CT imaging of the head and cervical spine was performed following the standard protocol without intravenous contrast. Multiplanar CT image reconstructions of the cervical  spine were also generated. RADIATION DOSE REDUCTION: This exam was performed according to the departmental dose-optimization program which includes automated exposure control, adjustment of the mA and/or kV according to patient size and/or use of iterative reconstruction technique. COMPARISON:  MRI cervical spine 09/28/2021, CT head 04/14/2022 FINDINGS: CT HEAD FINDINGS Brain: No evidence of large-territorial acute infarction. No parenchymal hemorrhage. No mass lesion. No extra-axial collection. No mass effect or midline shift. No hydrocephalus. Basilar cisterns are patent. Vascular: No hyperdense vessel. Skull: No acute fracture or focal lesion. Sinuses/Orbits: Paranasal sinuses and mastoid air cells are clear. The orbits are unremarkable. Other: None. CT CERVICAL SPINE FINDINGS Alignment: Reversal normal cervical lordosis due to positioning. Skull base and vertebrae: No acute fracture. No aggressive appearing focal osseous lesion or focal pathologic process. Soft tissues and spinal canal: No prevertebral fluid or swelling. No visible canal hematoma. Upper chest: Unremarkable. Other: None. IMPRESSION: 1. No acute intracranial abnormality. 2. No acute displaced fracture or traumatic listhesis of the cervical spine. Electronically Signed   By: Tish Frederickson M.D.   On: 01/19/2023 14:31   DG Forearm Right  Result Date: 01/19/2023 CLINICAL DATA:  Blunt trauma along the proximal forearm EXAM: RIGHT FOREARM - 2 VIEW COMPARISON:  None Available. FINDINGS: There is no evidence of fracture or other focal bone lesions. Soft tissues are unremarkable. IMPRESSION: Negative. Electronically Signed   By: Gaylyn Rong M.D.   On: 01/19/2023 13:00     Procedures Procedures  Medications Ordered in ED Medications  oxyCODONE-acetaminophen (PERCOCET/ROXICET) 5-325 MG per tablet 1 tablet (1 tablet Oral Given 01/19/23 1246)  prochlorperazine (COMPAZINE) injection 10 mg (10 mg Intramuscular Given 01/19/23 1245)    ED  Course/ Medical Decision Making/ A&P    Medical Decision Making Amount and/or Complexity of Data Reviewed Radiology: ordered.  Risk Prescription drug management.   42 y.o. female presents to the ER for evaluation of head and neck injury. Differential diagnosis includes but is not limited to trauma versus MSK. Vital signs unremarkable. Physical exam as noted above.   The patient was complaining of a headache however does not look any acute distress.  Will order her some IM Compazine as well as Percocet for her pain.  CT imaging shows 1. No acute intracranial abnormality. 2. No acute displaced fracture or traumatic listhesis of the cervical spine.  3. No acute displaced fracture or traumatic listhesis of the lumbar spine. Per radiology read.   XR imaging shows negative right forearm reading per radiology report.  Patient likely experiencing typical muscle skeletal pain after injury.  She does not have any focal neurodeficit.  She is oriented.  She did not be trouble walking or talking right after the incident nor did she have any trouble now.  She has been ambulatory without any assistance.  She is neuro vastly intact throughout with soft compartments.  I do not see any obvious signs of trauma.  She reports that her headache is substantially better after the medications.  CT of her lumbar is unremarkable.  She does not have any focal deficits.  She does not have any red flag symptoms as well.  Do not think any other imaging is necessary at this time emergently.  Her headache is improved and medications and she does not have any outward signs of head trauma and has reassuring imaging.  She likely has musculoskeletal pain in her right forearm with negative x-ray.  She does not have any snuffbox tenderness and has good equal grip strength and sensation.  Will prescribe her some ibuprofen, muscle laxer's, and some topical lidocaine patches.  Recommend that she follow-up with occupational health for  reevaluation.  Work note provided as well.  Patient stable for discharge home.  We discussed the results of the labs/imaging. The plan is follow up with occupational health, brain rest, take medication as prescribed.. We discussed strict return precautions and red flag symptoms. The patient verbalized their understanding and agrees to the plan. The patient is stable and being discharged home in good condition.  Portions of this report may have been transcribed using voice recognition software. Every effort was made to ensure accuracy; however, inadvertent computerized transcription errors may be present.   Final Clinical Impression(s) / ED Diagnoses Final diagnoses:  Injury of head, initial encounter  Right forearm pain  Acute bilateral low back pain without sciatica  Acute midline low back pain without sciatica    Rx / DC Orders ED Discharge Orders          Ordered    methocarbamol (ROBAXIN) 500 MG tablet  2 times daily        01/19/23 1516    ibuprofen (ADVIL) 600 MG tablet  Every 6 hours PRN        01/19/23 1516    lidocaine (LIDODERM) 5 %  Every 24 hours        01/19/23 1516              Achille Rich, PA-C 01/22/23 1303    Glendora Score, MD 01/22/23 1809

## 2023-01-19 NOTE — ED Triage Notes (Signed)
Pt was unloading truck at work A bundle of insulation that is greater than 75 pounds fell off fork lift rolled over back of head, upper back and RIGHT arm   Denies numbness in hands and feet Denies Dizziness and blurred vision

## 2023-01-19 NOTE — ED Notes (Signed)
Pt returned from CT Pain 7/10 Reattached to partial monitor

## 2023-01-19 NOTE — Discharge Instructions (Addendum)
You were seen in the ER today for evaluation of after your injury. Your imaging was unremarkable. Please make sure you follow up with your occupational health provider through your worker's compensation.  I recommend taking 1000 mg of Tylenol and/or 600 mg of ibuprofen every 6 hours as needed for pain.  Additionally, I prescribed you some muscle relaxers to take as needed.  Please do not drive or operate heavy machinery while on this medication as it can make you sleepy.  Additionally, I prescribed some lidocaine patches as well to use as needed.  If you have trouble picking these up, you can pick up lidocaine patches over-the-counter that are similar.  If you start having worsening headaches, vision changes, weakness, numbness, tingling, changes in your vision, trouble controlling your bowel or bladder, please return to urinary spray department for reevaluation.  If you have any other concerns, new or worsening symptoms, please return to the nearest Emergency Department for reevaluation.  Contact a doctor if: These symptoms do not go away: Headaches. Dizziness. Double vision or vision changes. Trouble sleeping. Changes in mood. You have new symptoms. Get help right away if: You have sudden: Headache that is very bad. Vomiting that does not stop. Changes in the size of one of your pupils. Pupils are the black centers of your eyes. Changes in how you see (vision). More confusion or more grumpy moods. You have a seizure. Your symptoms get worse. You have a clear or bloody fluid coming from your nose or ears. These symptoms may be an emergency. Get help right away. Call 911. Do not wait to see if the symptoms will go away. Do not drive yourself to the hospital.

## 2023-03-18 ENCOUNTER — Ambulatory Visit: Payer: BC Managed Care – PPO | Admitting: Nurse Practitioner

## 2023-03-18 VITALS — BP 123/82 | HR 78 | Temp 98.1°F | Ht 60.0 in | Wt 221.4 lb

## 2023-03-18 DIAGNOSIS — R5383 Other fatigue: Secondary | ICD-10-CM | POA: Diagnosis not present

## 2023-03-18 DIAGNOSIS — Z13220 Encounter for screening for lipoid disorders: Secondary | ICD-10-CM

## 2023-03-18 DIAGNOSIS — Z1329 Encounter for screening for other suspected endocrine disorder: Secondary | ICD-10-CM

## 2023-03-18 DIAGNOSIS — Z130 Encounter for screening for diseases of the blood and blood-forming organs and certain disorders involving the immune mechanism: Secondary | ICD-10-CM

## 2023-03-18 DIAGNOSIS — M7918 Myalgia, other site: Secondary | ICD-10-CM | POA: Diagnosis not present

## 2023-03-18 DIAGNOSIS — I10 Essential (primary) hypertension: Secondary | ICD-10-CM

## 2023-03-19 ENCOUNTER — Other Ambulatory Visit: Payer: Self-pay | Admitting: Nurse Practitioner

## 2023-03-19 ENCOUNTER — Encounter: Payer: Self-pay | Admitting: Nurse Practitioner

## 2023-03-19 ENCOUNTER — Telehealth: Payer: Self-pay

## 2023-03-19 DIAGNOSIS — Z130 Encounter for screening for diseases of the blood and blood-forming organs and certain disorders involving the immune mechanism: Secondary | ICD-10-CM | POA: Diagnosis not present

## 2023-03-19 DIAGNOSIS — I10 Essential (primary) hypertension: Secondary | ICD-10-CM | POA: Diagnosis not present

## 2023-03-19 DIAGNOSIS — R5383 Other fatigue: Secondary | ICD-10-CM | POA: Diagnosis not present

## 2023-03-19 DIAGNOSIS — Z13220 Encounter for screening for lipoid disorders: Secondary | ICD-10-CM | POA: Diagnosis not present

## 2023-03-19 DIAGNOSIS — M7918 Myalgia, other site: Secondary | ICD-10-CM | POA: Insufficient documentation

## 2023-03-19 MED ORDER — ALPRAZOLAM 0.25 MG PO TABS
ORAL_TABLET | ORAL | 0 refills | Status: AC
Start: 2023-03-19 — End: ?

## 2023-03-19 MED ORDER — IBUPROFEN 600 MG PO TABS: 600.0000 mg | ORAL_TABLET | Freq: Four times a day (QID) | ORAL | 0 refills | Status: DC | PRN

## 2023-03-19 NOTE — Telephone Encounter (Signed)
Prescription Request  03/19/2023  LOV: Visit date not found  What is the name of the medication or equipment? ALPRAZolam (XANAX) 0.25 MG tablet albuterol, ,ibuprofen (ADVIL) 600 MG tablet , Have you contacted your pharmacy to request a refill? Yes   Which pharmacy would you like this sent to?   [facility] [address]    Patient notified that their request is being sent to the clinical staff for review and that they should receive a response within 2 business days.   Please advise at Mobile [phone] (mobile)

## 2023-03-19 NOTE — Progress Notes (Signed)
Subjective:    Patient ID: Jasmin Chen, female    DOB: 1980/06/24, 42 y.o.   MRN: [id number]  HPI Presents for complaints of fatigue and generalized muscle aches over the past year.  Particularly along the posterior leg area bilaterally.  Has been getting worse over time.  No edema.  Patient is describing multiple injuries over the past few months.  Works at [employer] which requires a lot of physical demands.  Patient is in a supervisory position which also causes stress.  Working 15-16-hour shifts Monday through Friday.  Averages about 5 to 6 hours of sleep at night at best.  States some days she is so exhausted it is hard to walk up the steps to her home and falls asleep on the couch.  Sometimes even skips meals.  Patient transferred to the site about 2 years ago and describes a toxic work environment.  Has a positive family history of fibromyalgia including her mother and sister.  Describes diet as poor.  Used to workout on a regular basis but this has stopped.   Review of Systems  Constitutional:  Positive for fatigue.  HENT:  Negative for sore throat and trouble swallowing.   Respiratory:  Negative for cough, chest tightness and shortness of breath.   Cardiovascular:  Negative for chest pain.  Musculoskeletal:  Positive for myalgias.      03/18/2023    3:49 PM  Depression screen PHQ 2/9  Decreased Interest 0  Down, Depressed, Hopeless 0  PHQ - 2 Score 0  Altered sleeping 3  Tired, decreased energy 3  Change in appetite 0  Feeling bad or failure about yourself  0  Trouble concentrating 0  Moving slowly or fidgety/restless 0  Suicidal thoughts 0  PHQ-9 Score 6  Difficult doing work/chores Not difficult at all      03/18/2023    3:50 PM 10/10/2019   11:32 AM  GAD 7 : Generalized Anxiety Score  Nervous, Anxious, on Edge 0 1  Control/stop worrying 0 1  Worry too much - different things 3 1  Trouble relaxing 3 1  Restless 0 1  Easily annoyed or irritable 0 1  Afraid  - awful might happen 0 0  Total GAD 7 Score 6 6  Anxiety Difficulty Not difficult at all          Objective:   Physical Exam NAD.  Alert, oriented.  Mildly fatigued in appearance.  Making good eye contact.  Speech clear.  Calm affect.  Thyroid nontender to palpation, no mass or goiter noted.  Lungs clear.  Heart regular rate rhythm.  No tenderness noted with deep palpation of the legs.  No edema.  No erythema or skin changes. Today's Vitals   03/18/23 1542  BP: 123/82  Pulse: 78  Temp: 98.1 F (36.7 C)  SpO2: 100%  Weight: 221 lb 6.4 oz (100.4 kg)  Height: 5' (1.524 m)   Body mass index is 43.24 kg/m. Has gained 21 pounds over the past year.       Assessment & Plan:   Problem List Items Addressed This Visit       Cardiovascular and Mediastinum   Benign hypertension - Primary (Chronic)   Relevant Orders   Comprehensive metabolic panel   Lipid panel     Other   Fatigue   Relevant Orders   CBC with Differential/Platelet   Comprehensive metabolic panel   TSH   Myalgia, multiple sites   Physical exhaustion  Other Visit Diagnoses     Screening for deficiency anemia       Relevant Orders   CBC with Differential/Platelet   Screening for lipid disorders       Relevant Orders   Lipid panel   Screening for thyroid disorder       Relevant Orders   TSH      Lengthy discussion regarding the impact of poor diet, stress, and inadequate rest on her health.  Strongly recommend patient consider switching to another site for work if possible.  Feel that many of her symptoms are due to to the stress placed upon her body. Encouraged healthy diet, time for herself including stress reduction, less work hours, and resuming her regular workouts.  If myalgias and fatigue persist beyond this, consider workup for fibromyalgia. Routine labs ordered today.  Also encouraged preventive health physical.

## 2023-03-20 LAB — CBC WITH DIFFERENTIAL/PLATELET
Basophils Absolute: 0 10*3/uL (ref 0.0–0.2)
Basos: 1 %
EOS (ABSOLUTE): 0.4 10*3/uL (ref 0.0–0.4)
Eos: 7 %
Hematocrit: 43.3 % (ref 34.0–46.6)
Hemoglobin: 13.6 g/dL (ref 11.1–15.9)
Immature Grans (Abs): 0 10*3/uL (ref 0.0–0.1)
Immature Granulocytes: 0 %
Lymphocytes Absolute: 2.3 10*3/uL (ref 0.7–3.1)
Lymphs: 46 %
MCH: 27.9 pg (ref 26.6–33.0)
MCHC: 31.4 g/dL — ABNORMAL LOW (ref 31.5–35.7)
MCV: 89 fL (ref 79–97)
Monocytes Absolute: 0.4 10*3/uL (ref 0.1–0.9)
Monocytes: 8 %
Neutrophils Absolute: 1.9 10*3/uL (ref 1.4–7.0)
Neutrophils: 38 %
Platelets: 247 10*3/uL (ref 150–450)
RBC: 4.88 x10E6/uL (ref 3.77–5.28)
RDW: 13.8 % (ref 11.7–15.4)
WBC: 5 10*3/uL (ref 3.4–10.8)

## 2023-03-20 LAB — COMPREHENSIVE METABOLIC PANEL
ALT: 12 [IU]/L (ref 0–32)
AST: 11 [IU]/L (ref 0–40)
Albumin: 4.2 g/dL (ref 3.9–4.9)
Alkaline Phosphatase: 61 [IU]/L (ref 44–121)
BUN/Creatinine Ratio: 14 (ref 9–23)
BUN: 9 mg/dL (ref 6–24)
Bilirubin Total: 0.6 mg/dL (ref 0.0–1.2)
CO2: 24 mmol/L (ref 20–29)
Calcium: 9.1 mg/dL (ref 8.7–10.2)
Chloride: 101 mmol/L (ref 96–106)
Creatinine, Ser: 0.63 mg/dL (ref 0.57–1.00)
Globulin, Total: 2.6 g/dL (ref 1.5–4.5)
Glucose: 81 mg/dL (ref 70–99)
Potassium: 4.1 mmol/L (ref 3.5–5.2)
Sodium: 139 mmol/L (ref 134–144)
Total Protein: 6.8 g/dL (ref 6.0–8.5)
eGFR: 113 mL/min/{1.73_m2} (ref >59)

## 2023-03-20 LAB — LIPID PANEL
Chol/HDL Ratio: 3.4 {ratio} (ref 0.0–4.4)
Cholesterol, Total: 217 mg/dL — ABNORMAL HIGH (ref 100–199)
HDL: 63 mg/dL (ref >39)
LDL Chol Calc (NIH): 122 mg/dL — ABNORMAL HIGH (ref 0–99)
Triglycerides: 184 mg/dL — ABNORMAL HIGH (ref 0–149)
VLDL Cholesterol Cal: 32 mg/dL (ref 5–40)

## 2023-03-20 LAB — TSH: TSH: 2.64 u[IU]/mL (ref 0.450–4.500)

## 2023-03-24 DIAGNOSIS — M79662 Pain in left lower leg: Secondary | ICD-10-CM | POA: Diagnosis not present

## 2023-03-25 DIAGNOSIS — M79662 Pain in left lower leg: Secondary | ICD-10-CM | POA: Diagnosis not present

## 2023-04-12 ENCOUNTER — Other Ambulatory Visit (HOSPITAL_COMMUNITY): Payer: Self-pay | Admitting: Family Medicine

## 2023-04-12 ENCOUNTER — Ambulatory Visit (INDEPENDENT_AMBULATORY_CARE_PROVIDER_SITE_OTHER): Payer: BC Managed Care – PPO | Admitting: Nurse Practitioner

## 2023-04-12 ENCOUNTER — Encounter: Payer: Self-pay | Admitting: Nurse Practitioner

## 2023-04-12 VITALS — BP 136/84 | HR 83 | Ht 60.0 in | Wt 218.2 lb

## 2023-04-12 DIAGNOSIS — Z1151 Encounter for screening for human papillomavirus (HPV): Secondary | ICD-10-CM | POA: Diagnosis not present

## 2023-04-12 DIAGNOSIS — S30811A Abrasion of abdominal wall, initial encounter: Secondary | ICD-10-CM

## 2023-04-12 DIAGNOSIS — Z113 Encounter for screening for infections with a predominantly sexual mode of transmission: Secondary | ICD-10-CM | POA: Diagnosis not present

## 2023-04-12 DIAGNOSIS — Z01419 Encounter for gynecological examination (general) (routine) without abnormal findings: Secondary | ICD-10-CM | POA: Diagnosis not present

## 2023-04-12 DIAGNOSIS — Z23 Encounter for immunization: Secondary | ICD-10-CM

## 2023-04-12 DIAGNOSIS — Z01411 Encounter for gynecological examination (general) (routine) with abnormal findings: Secondary | ICD-10-CM

## 2023-04-12 DIAGNOSIS — Z124 Encounter for screening for malignant neoplasm of cervix: Secondary | ICD-10-CM

## 2023-04-12 DIAGNOSIS — Z1231 Encounter for screening mammogram for malignant neoplasm of breast: Secondary | ICD-10-CM

## 2023-04-12 MED ORDER — ALBUTEROL SULFATE HFA 108 (90 BASE) MCG/ACT IN AERS: 2.0000 | INHALATION_SPRAY | Freq: Four times a day (QID) | RESPIRATORY_TRACT | 0 refills | Status: DC | PRN

## 2023-04-12 MED ORDER — SEMAGLUTIDE-WEIGHT MANAGEMENT 0.25 MG/0.5ML ~~LOC~~ SOAJ: 0.2500 mg | SUBCUTANEOUS | 0 refills | Status: DC

## 2023-04-12 NOTE — Patient Instructions (Signed)
If you do a video visit, please check weight first.

## 2023-04-12 NOTE — Progress Notes (Unsigned)
Subjective:    Patient ID: Jasmin Chen, female    DOB: [birth year], 42 y.o.   MRN: [id number]  HPI The patient comes in today for a wellness visit.    A review of their health history was completed.  A review of medications was also completed.  Any needed refills; yes, new prescription albuterol inhaler  Eating habits: Fair  Falls/  MVA accidents in past few months: no  Regular exercise: some, walking at the park; also very active job  Specialist pt sees on regular basis: no  Preventative health issues were discussed.   Additional concerns: Left leg muscle tenses behind the knee.  Patient would also like to talk about weight management. C/o "contractions" in the left posterior leg for the past 1 1/2 months. Unassociated with any particular movements. Very sporadic. No edema. Starts at the achilles tendon and goes up the back of the lower leg.  BP outside of the office 120/84. No new partners in the past year. Had a supracervical hysterectomy and left salpingo oophorectomy.  Needs eye exam. Regular dental exams.     Review of Systems  Constitutional:  Negative for activity change, appetite change and fatigue.  HENT:  Negative for sore throat and trouble swallowing.   Respiratory:  Negative for cough, chest tightness, shortness of breath and wheezing.   Cardiovascular:  Negative for chest pain.  Gastrointestinal:  Negative for abdominal distention, abdominal pain, constipation, diarrhea, nausea and vomiting.  Genitourinary:  Negative for difficulty urinating, dysuria, enuresis, frequency, genital sores, menstrual problem, pelvic pain, urgency and vaginal discharge.       Objective:   Physical Exam Vitals and nursing note reviewed. Chaperone present: Defers chaperone.  Constitutional:      General: She is not in acute distress.    Appearance: She is well-developed.  Neck:     Thyroid: No thyromegaly.     Trachea: No tracheal deviation.     Comments: Thyroid non  tender to palpation. No mass or goiter noted.  Cardiovascular:     Rate and Rhythm: Normal rate and regular rhythm.     Heart sounds: Normal heart sounds. No murmur heard. Pulmonary:     Effort: Pulmonary effort is normal.     Breath sounds: Normal breath sounds.  Chest:  Breasts:    Right: No swelling, inverted nipple, mass, skin change or tenderness.     Left: No swelling, inverted nipple, mass, skin change or tenderness.  Abdominal:     General: There is no distension.     Palpations: Abdomen is soft.     Tenderness: There is no abdominal tenderness.  Genitourinary:    General: Normal vulva.     Exam position: Lithotomy position.     Labia:        Right: No rash, tenderness or lesion.        Left: No rash, tenderness or lesion.      Vagina: No vaginal discharge, erythema or lesions.     Cervix: No cervical motion tenderness, discharge, friability, lesion or erythema.     Uterus: Absent.      Comments: Bimanual exam: no tenderness or obvious masses.  Musculoskeletal:     Cervical back: Normal range of motion and neck supple.     Right lower leg: No edema.     Left lower leg: No edema.     Comments: Calf circumference right 44.2 cm and left 45 cm. No tenderness or masses with palpation of the left lower leg.  No popliteal masses.   Lymphadenopathy:     Cervical: No cervical adenopathy.     Upper Body:     Right upper body: No supraclavicular, axillary or pectoral adenopathy.     Left upper body: No supraclavicular, axillary or pectoral adenopathy.  Skin:    General: Skin is warm and dry.     Comments: Small superficial healing abrasion right upper abdomen.   Neurological:     Mental Status: She is alert and oriented to person, place, and time.  Psychiatric:        Mood and Affect: Mood normal.        Behavior: Behavior normal.        Thought Content: Thought content normal.        Judgment: Judgment normal.    Today's Vitals   04/12/23 0830 04/12/23 0928  BP: (!)  136/90 136/84  Pulse: 83   SpO2: 98%   Weight: 218 lb 3.2 oz (99 kg)   Height: 5' (1.524 m)    Body mass index is 42.61 kg/m.   Results for orders placed or performed in visit on 03/18/23  CBC with Differential/Platelet  Result Value Ref Range   WBC 5.0 3.4 - 10.8 x10E3/uL   RBC 4.88 3.77 - 5.28 x10E6/uL   Hemoglobin 13.6 11.1 - 15.9 g/dL   Hematocrit 16.1 09.6 - 46.6 %   MCV 89 79 - 97 fL   MCH 27.9 26.6 - 33.0 pg   MCHC 31.4 (L) 31.5 - 35.7 g/dL   RDW 04.5 40.9 - 81.1 %   Platelets 247 150 - 450 x10E3/uL   Neutrophils 38 Not Estab. %   Lymphs 46 Not Estab. %   Monocytes 8 Not Estab. %   Eos 7 Not Estab. %   Basos 1 Not Estab. %   Neutrophils Absolute 1.9 1.4 - 7.0 x10E3/uL   Lymphocytes Absolute 2.3 0.7 - 3.1 x10E3/uL   Monocytes Absolute 0.4 0.1 - 0.9 x10E3/uL   EOS (ABSOLUTE) 0.4 0.0 - 0.4 x10E3/uL   Basophils Absolute 0.0 0.0 - 0.2 x10E3/uL   Immature Granulocytes 0 Not Estab. %   Immature Grans (Abs) 0.0 0.0 - 0.1 x10E3/uL  Comprehensive metabolic panel  Result Value Ref Range   Glucose 81 70 - 99 mg/dL   BUN 9 6 - 24 mg/dL   Creatinine, Ser 9.14 0.57 - 1.00 mg/dL   eGFR 782 >95 AO/ZHY/8.65   BUN/Creatinine Ratio 14 9 - 23   Sodium 139 134 - 144 mmol/L   Potassium 4.1 3.5 - 5.2 mmol/L   Chloride 101 96 - 106 mmol/L   CO2 24 20 - 29 mmol/L   Calcium 9.1 8.7 - 10.2 mg/dL   Total Protein 6.8 6.0 - 8.5 g/dL   Albumin 4.2 3.9 - 4.9 g/dL   Globulin, Total 2.6 1.5 - 4.5 g/dL   Bilirubin Total 0.6 0.0 - 1.2 mg/dL   Alkaline Phosphatase 61 44 - 121 IU/L   AST 11 0 - 40 IU/L   ALT 12 0 - 32 IU/L  Lipid panel  Result Value Ref Range   Cholesterol, Total 217 (H) 100 - 199 mg/dL   Triglycerides 784 (H) 0 - 149 mg/dL   HDL 63 >69 mg/dL   VLDL Cholesterol Cal 32 5 - 40 mg/dL   LDL Chol Calc (NIH) 629 (H) 0 - 99 mg/dL   Chol/HDL Ratio 3.4 0.0 - 4.4 ratio  TSH  Result Value Ref Range   TSH 2.640 0.450 - 4.500  uIU/mL   Message sent through my chart and reviewed  today.      Assessment & Plan:   Problem List Items Addressed This Visit       Other   Morbid obesity (HCC) (Chronic)   Relevant Medications   Semaglutide-Weight Management 0.25 MG/0.5ML SOAJ   Other Visit Diagnoses     Well woman exam    -  Primary   Relevant Orders   IGP,CtNgTv,Apt HPV   Screening for cervical cancer       Relevant Orders   IGP,CtNgTv,Apt HPV   Screening for HPV (human papillomavirus)       Relevant Orders   IGP,CtNgTv,Apt HPV   Screen for STD (sexually transmitted disease)       Relevant Orders   IGP,CtNgTv,Apt HPV   Abrasion of abdominal wall, initial encounter       Relevant Orders   Tdap vaccine greater than or equal to 7yo IM (Completed)      Tdap today. Call back if any signs of infection.  Mammogram scheduled. Recommend referral to orthopedics if leg pain continues. No evidence of DVT by history or exam.  Meds ordered this encounter  Medications   Semaglutide-Weight Management 0.25 MG/0.5ML SOAJ    Sig: Inject 0.25 mg into the skin once a week.    Dispense:  2 mL    Refill:  0    Order Specific Question:   Supervising Provider    Answer:   Lilyan Punt A [[id number]]   albuterol (VENTOLIN HFA) 108 (90 Base) MCG/ACT inhaler    Sig: Inhale 2 puffs into the lungs every 6 (six) hours as needed for wheezing or shortness of breath.    Dispense:  8 g    Refill:  0    Order Specific Question:   Supervising Provider    Answer:   Babs Sciara [[id number]]   Patient states her insurance covers [phone] but advised that the PA may be extensive. RF Albuterol per patient request. Very rare use.  Discussed healthy diet, regular activity and weight loss.  Return in about 1 month (around 05/13/2023) for video or in person. If she starts Wegovy.

## 2023-04-13 ENCOUNTER — Telehealth: Payer: Self-pay | Admitting: *Deleted

## 2023-04-13 NOTE — Telephone Encounter (Signed)
PA for [facility] denied: Patient must participate in a comprehensive weight management program that encourages behavioral modification, reduced calorie diet, and increased physical activity with continuing follow-up for at least 6 months prior to using drug therapy

## 2023-04-15 ENCOUNTER — Encounter: Payer: Self-pay | Admitting: Nurse Practitioner

## 2023-04-15 NOTE — Telephone Encounter (Signed)
Message sent to patient

## 2023-04-15 NOTE — Telephone Encounter (Signed)
Patient would like referral to healthy weight and wellness center and also states in the past after her weight loss surgery when she started gaining her weight back she used phentermine and would like to know can she go back on that for the 6 months until she can get the wegovy approved.

## 2023-04-17 LAB — IGP,CTNGTV,APT HPV
Chlamydia, Nuc. Acid Amp: NEGATIVE
Gonococcus, Nuc. Acid Amp: NEGATIVE
HPV Aptima: NEGATIVE
Trich vag by NAA: NEGATIVE

## 2023-04-19 ENCOUNTER — Ambulatory Visit (HOSPITAL_COMMUNITY)
Admission: RE | Admit: 2023-04-19 | Discharge: 2023-04-19 | Disposition: A | Discharge status: Home / Self Care | Payer: BC Managed Care – PPO | Source: Ambulatory Visit | Attending: Family Medicine | Admitting: Family Medicine

## 2023-04-19 DIAGNOSIS — Z1231 Encounter for screening mammogram for malignant neoplasm of breast: Secondary | ICD-10-CM | POA: Insufficient documentation

## 2023-04-22 ENCOUNTER — Other Ambulatory Visit (HOSPITAL_COMMUNITY): Payer: Self-pay | Admitting: Family Medicine

## 2023-04-22 DIAGNOSIS — R928 Other abnormal and inconclusive findings on diagnostic imaging of breast: Secondary | ICD-10-CM

## 2023-05-05 ENCOUNTER — Other Ambulatory Visit: Payer: Self-pay | Admitting: Nurse Practitioner

## 2023-05-06 ENCOUNTER — Ambulatory Visit: Payer: BC Managed Care – PPO

## 2023-05-06 ENCOUNTER — Ambulatory Visit
Admission: RE | Admit: 2023-05-06 | Discharge: 2023-05-06 | Disposition: A | Discharge status: Home / Self Care | Payer: BC Managed Care – PPO | Source: Ambulatory Visit | Attending: Family Medicine | Admitting: Family Medicine

## 2023-05-06 ENCOUNTER — Other Ambulatory Visit (HOSPITAL_COMMUNITY): Payer: Self-pay | Admitting: Family Medicine

## 2023-05-06 DIAGNOSIS — N6310 Unspecified lump in the right breast, unspecified quadrant: Secondary | ICD-10-CM

## 2023-05-06 DIAGNOSIS — R928 Other abnormal and inconclusive findings on diagnostic imaging of breast: Secondary | ICD-10-CM

## 2023-05-06 DIAGNOSIS — N6311 Unspecified lump in the right breast, upper outer quadrant: Secondary | ICD-10-CM | POA: Diagnosis not present

## 2023-05-12 ENCOUNTER — Ambulatory Visit: Payer: BC Managed Care – PPO | Admitting: Family Medicine

## 2023-05-31 ENCOUNTER — Telehealth: Payer: Self-pay

## 2023-05-31 NOTE — Telephone Encounter (Signed)
Prescription Request  05/31/2023  LOV: Visit date not found  What is the name of the medication or equipment? ibuprofen (ADVIL) 600 MG tablet   Have you contacted your pharmacy to request a refill? Yes   Which pharmacy would you like this sent to? [facility]   Patient notified that their request is being sent to the clinical staff for review and that they should receive a response within 2 business days.   Please advise at Mobile [phone] (mobile)

## 2023-06-01 ENCOUNTER — Encounter: Payer: Self-pay | Admitting: Nurse Practitioner

## 2023-06-01 ENCOUNTER — Other Ambulatory Visit: Payer: Self-pay | Admitting: Nurse Practitioner

## 2023-06-01 MED ORDER — IBUPROFEN 600 MG PO TABS: 600.0000 mg | ORAL_TABLET | Freq: Four times a day (QID) | ORAL | 0 refills | Status: DC | PRN

## 2023-06-30 DIAGNOSIS — R112 Nausea with vomiting, unspecified: Secondary | ICD-10-CM | POA: Diagnosis not present

## 2023-06-30 DIAGNOSIS — R0981 Nasal congestion: Secondary | ICD-10-CM | POA: Diagnosis not present

## 2023-06-30 DIAGNOSIS — R197 Diarrhea, unspecified: Secondary | ICD-10-CM | POA: Diagnosis not present

## 2023-09-28 DIAGNOSIS — J301 Allergic rhinitis due to pollen: Secondary | ICD-10-CM | POA: Diagnosis not present

## 2023-11-13 ENCOUNTER — Other Ambulatory Visit: Payer: Self-pay | Admitting: Nurse Practitioner

## 2023-11-18 DIAGNOSIS — R519 Headache, unspecified: Secondary | ICD-10-CM | POA: Diagnosis not present

## 2023-11-22 ENCOUNTER — Ambulatory Visit: Payer: Self-pay | Admitting: *Deleted

## 2023-11-22 NOTE — Telephone Encounter (Signed)
     FYI Only or Action Required?: FYI only for provider  Patient was last seen in primary care on 04/12/2023 by Derenda Flax, NP. Called Nurse Triage reporting Hypertension. Symptoms began a week ago. Interventions attempted: Nothing. Symptoms are: unchanged.  Triage Disposition: See PCP Within 2 Weeks  Patient/caregiver understands and will follow disposition?:  Yes  Copied from CRM [id number]. Topic: Clinical - Red Word Triage >> Nov 22, 2023 11:01 AM Elle L wrote: Red Word that prompted transfer to Nurse Triage: The patient went to Urgent Care last week due to a migraine and found out that it was due to her blood pressure being high. However, she is still experiencing high blood pressure and migraines. Her readings have been 133/96 and 135/98. Reason for Disposition  [1] Systolic BP  >= 130 OR Diastolic >= 80 AND [2] not taking BP medications  Answer Assessment - Initial Assessment Questions 1. BLOOD PRESSURE: "What is the blood pressure?" "Did you take at least two measurements 5 minutes apart?"     I'm having headaches and found out my BP is running high. 133/96 and 135/98 these are from yesterday from a home monitor.    I went to urgent care Thur.      I went to dentist Wed. and they checked my BP.   THey told me it was elevated but did not tell me the reading.    Thur. I woke up with a migraine that got worse and worse.   I went to urgent care and they said it was because my BP was high.    They finally got it down to 126/95 before they would let me leave the urgent care.  I'm still having the headaches but not as severe as Thur. 2. ONSET: "When did you take your blood pressure?"     Found out Wed. At dentist's office 3. HOW: "How did you take your blood pressure?" (e.g., automatic home BP monitor, visiting nurse)     Checking it at home 4. HISTORY: "Do you have a history of high blood pressure?"     No 5. MEDICINES: "Are you taking any medicines for blood pressure?" "Have you  missed any doses recently?"     No 6. OTHER SYMPTOMS: "Do you have any symptoms?" (e.g., blurred vision, chest pain, difficulty breathing, headache, weakness)     Migraines       7. PREGNANCY: "Is there any chance you are pregnant?" "When was your last menstrual period?"     Not asked  Protocols used: Blood Pressure - High-A-AH

## 2023-11-24 ENCOUNTER — Ambulatory Visit: Admitting: Family Medicine

## 2023-11-24 VITALS — BP 124/79 | HR 105 | Temp 98.1°F | Ht 60.0 in | Wt 222.2 lb

## 2023-11-24 DIAGNOSIS — Z566 Other physical and mental strain related to work: Secondary | ICD-10-CM

## 2023-11-24 DIAGNOSIS — G43809 Other migraine, not intractable, without status migrainosus: Secondary | ICD-10-CM | POA: Diagnosis not present

## 2023-11-24 DIAGNOSIS — G4700 Insomnia, unspecified: Secondary | ICD-10-CM

## 2023-11-24 NOTE — Progress Notes (Signed)
   Subjective:    Patient ID: Georgena Kingdom, female    DOB: [birth year], 43 y.o.   MRN: [id number]  HPI Discussed the use of AI scribe software for clinical note transcription with the patient, who gave verbal consent to proceed.  History of Present Illness   Tyger LUDELL ZACARIAS is a 43 year old female who presents with elevated blood pressure and headaches.  She has been experiencing constant stress primarily due to her work environment, which has impacted her sleep and overall well-being. She works from 4 AM to 1 PM but often stays later, contributing to her stress levels. She has attempted to reduce her workload and has been allowed to hire eight new people, which she hopes will improve her situation.  She describes a recent episode where her blood pressure was noted to be elevated during a dental cleaning, which she attributed to rushing from work. The following day, she experienced a headache upon waking, which worsened to a throbbing pain by mid-morning at work. She took Tylenol  without relief and went to urgent care, where her blood pressure was initially elevated but eventually decreased to 126/95. She has been monitoring her blood pressure at home, with readings ranging from 122/78 to 134/97.  She reports difficulty sleeping, typically going to bed at 8 PM but only sleeping for 45 minutes to an hour before waking. She attributes this to anticipation of her early wake-up time at 2 AM. She drinks coffee, usually finishing by morning hours, and occasionally has two cups depending on her day. She has tried ZzzQuil for sleep without success and has not tried melatonin. She uses Dr. Lorrie Rothman products during showers but has not taken melatonin as a medication.  She owns her home but feels unable to enjoy it due to her work schedule. No nausea, flashing lights, or blurry vision associated with her headaches.        Review of Systems     Objective:   Physical Exam  General-in no acute  distress Eyes-no discharge Lungs-respiratory rate normal, CTA CV-no murmurs,RRR Extremities skin warm dry no edema Neuro grossly normal Behavior normal, alert Blood pressure rechecked overall good  Should be noted that her wrist monitor gave high reading.  We did discuss accuracy is better with a upper arm proper sized blood pressure cuff and the proper way to take blood pressure    Assessment & Plan:  History of elevated blood pressure-none on today's findings warning signs discussed check blood pressure accordingly  Stress at work-no need for medication currently patient is making work adjustments hopefully this will help things  1. Other migraine without status migrainosus, not intractable (Primary) Tylenol  as needed keep monitoring her blood pressure if any ongoing troubles or problems notify us  keep track of when headaches occur and what seems to make them worse or better if any vomiting with the headaches or waking up frequently at night with headaches notify us  immediately otherwise give us  update in approximately 4 weeks  2. Insomnia, unspecified type Melatonin at nighttime hold off on any sleeping medicine sleep hygiene discussed  3. Morbid obesity (HCC) Portion control regular physical activity  4. Stress at work Stress relief techniques discussed patient is trying to get workplace better

## 2024-01-14 DIAGNOSIS — J029 Acute pharyngitis, unspecified: Secondary | ICD-10-CM | POA: Diagnosis not present

## 2024-01-14 DIAGNOSIS — Z20822 Contact with and (suspected) exposure to covid-19: Secondary | ICD-10-CM | POA: Diagnosis not present

## 2024-01-14 DIAGNOSIS — R519 Headache, unspecified: Secondary | ICD-10-CM | POA: Diagnosis not present

## 2024-01-14 DIAGNOSIS — J329 Chronic sinusitis, unspecified: Secondary | ICD-10-CM | POA: Diagnosis not present

## 2024-01-14 DIAGNOSIS — R0981 Nasal congestion: Secondary | ICD-10-CM | POA: Diagnosis not present

## 2024-01-19 ENCOUNTER — Ambulatory Visit: Payer: Self-pay

## 2024-01-19 NOTE — Telephone Encounter (Signed)
 FYI Only or Action Required?: Action required by provider: request for appointment.  Patient was last seen in primary care on 11/24/2023 by Jasmin Glendia LABOR, MD.  Called Nurse Triage reporting No chief complaint on file..  Symptoms began several weeks ago.  Interventions attempted: Rest, hydration, or home remedies.  Symptoms are: gradually worsening.  Triage Disposition: See Physician Within 24 Hours  Patient/caregiver understands and will follow disposition?: Yes   Copied from CRM #[id number]. Topic: Clinical - Red Word Triage >> Jan 19, 2024 10:36 AM [name] wrote: [facility] that prompted transfer to Nurse Triage: Anxiety, stress Reason for Disposition  Patient sounds very upset or troubled to the triager  Answer Assessment - Initial Assessment Questions 1. CONCERN: Did anything happen that prompted you to call today?      Unable to deal with this anxiety on her own.   2. ANXIETY SYMPTOMS: Can you describe how you (your loved one; patient) have been feeling? (e.g., tense, restless, panicky, anxious, keyed up, overwhelmed, sense of impending doom).       Anxiety, Physical Manifesting of Symptoms  3. ONSET: How long have you been feeling this way? (e.g., hours, days, weeks)     6 Weeks  4. SEVERITY: How would you rate the level of anxiety? (e.g., 0 - 10; or mild, moderate, severe).     Moderate  5. FUNCTIONAL IMPAIRMENT: How have these feelings affected your ability to do daily activities? Have you had more difficulty than usual doing your normal daily activities? (e.g., getting better, same, worse; self-care, school, work, interactions)     Interfering heavily with work  6. HISTORY: Have you felt this way before? Have you ever been diagnosed with an anxiety problem in the past? (e.g., generalized anxiety disorder, panic attacks, PTSD). If Yes, ask: How was this problem treated? (e.g., medicines, counseling, etc.)     No, first time of this level of  intensity  7. RISK OF HARM - SUICIDAL IDEATION: Do you ever have thoughts of hurting or killing yourself? If Yes, ask:  Do you have these feelings now? Do you have a plan on how you would do this?     No  8. TREATMENT:  What has been done so far to treat this anxiety? (e.g., medicines, relaxation strategies). What has helped?     Relaxation Strategies, Medications  9. THERAPIST: Do you have a counselor or therapist? If Yes, ask: What is their name?     No  10. POTENTIAL TRIGGERS: Do you drink caffeinated beverages (e.g., coffee, colas, teas), and how much daily? Do you drink alcohol or use any drugs? Have you started any new medicines recently?       No changes in diet  11. PATIENT SUPPORT: Who is with you now? Who do you live with? Do you have family or friends who you can talk to?        Lives alone  12. OTHER SYMPTOMS: Do you have any other symptoms? (e.g., feeling depressed, trouble concentrating, trouble sleeping, trouble breathing, palpitations or fast heartbeat, chest pain, sweating, nausea, or diarrhea)       Headaches, Irritability, Trouble Concentrating , Abdominal Pain, Arm Pain, Loss of Appetite  13. PREGNANCY: Is there any chance you are pregnant? When was your last menstrual period?       No, Partial Hysterectomy  Protocols used: Anxiety and Panic Attack-A-AH

## 2024-01-19 NOTE — Telephone Encounter (Signed)
 Appt scheduled

## 2024-01-20 ENCOUNTER — Encounter: Payer: Self-pay | Admitting: Family Medicine

## 2024-01-20 ENCOUNTER — Ambulatory Visit: Admitting: Family Medicine

## 2024-01-20 VITALS — BP 124/84 | HR 73 | Temp 97.7°F | Ht 60.0 in | Wt 221.0 lb

## 2024-01-20 DIAGNOSIS — F411 Generalized anxiety disorder: Secondary | ICD-10-CM

## 2024-01-20 DIAGNOSIS — G43809 Other migraine, not intractable, without status migrainosus: Secondary | ICD-10-CM

## 2024-01-20 DIAGNOSIS — Z566 Other physical and mental strain related to work: Secondary | ICD-10-CM | POA: Diagnosis not present

## 2024-01-20 DIAGNOSIS — R32 Unspecified urinary incontinence: Secondary | ICD-10-CM

## 2024-01-20 DIAGNOSIS — F410 Panic disorder [episodic paroxysmal anxiety] without agoraphobia: Secondary | ICD-10-CM

## 2024-01-20 LAB — POCT URINALYSIS DIP (CLINITEK)
Bilirubin, UA: NEGATIVE
Blood, UA: NEGATIVE
Glucose, UA: NEGATIVE mg/dL
Ketones, POC UA: NEGATIVE mg/dL
Leukocytes, UA: NEGATIVE
Nitrite, UA: NEGATIVE
Spec Grav, UA: <=1.005 — AB (ref 1.010–1.025)
Urobilinogen, UA: 0.2 U/dL
pH, UA: 8 (ref 5.0–8.0)

## 2024-01-20 MED ORDER — SERTRALINE HCL 50 MG PO TABS: 50.0000 mg | ORAL_TABLET | Freq: Every day | ORAL | 3 refills | Status: AC

## 2024-01-20 MED ORDER — TOPIRAMATE 50 MG PO TABS: 50.0000 mg | ORAL_TABLET | Freq: Two times a day (BID) | ORAL | 5 refills | Status: AC

## 2024-01-20 NOTE — Progress Notes (Unsigned)
   Subjective:    Patient ID: Jasmin Chen, female    DOB: Oct 30, 1980, 43 y.o.   MRN: [id number]  HPI Anxiety and physical manifestations  Work related stress / anxiety , health concerns Affecting sleep, appetite, immunity, urinary incontinence, headaches, panic attacks, right  arm pain started this week, LLQ abd pain - work 60+ hrs weekly Patient relates that she is having significant anxiety She states she feels anxious nervous panicky Having panic attacks Also having physical discomfort all over including severe headaches that will not stop no nausea or vomiting No blurred vision She takes ibuprofen  frequently and Goody powders frequently She also relates intermittent difficulties holding her urine denies dysuria denies fever has had a recent cold and congestion intermittent abdominal discomfort Review of Systems     Objective:   Physical Exam 20 minutes spent with patient discussing her issues       Assessment & Plan:  Severe anxiety Intermittent panic attacks Frequent headaches Difficulty sleeping  Large portion of these problems are coming from the fact that she is overstressed with her job with the amount of hours she is working over 60 hours/week as well as stress levels We did discuss taking a leave of absence She was given a leave of absence from 01/17/2024 through 02/13/2024 Schedule follow-up in 4 weeks Topamax  50 mg twice daily to try to help with headaches Try to avoid ibuprofen  and Goody powders Sertraline  50 mg half tablet daily for 7 days possibly nausea with medicine Then 50 mg daily Patient to give us  feedback within 10 to 14 days how she is doing Follow-up office visit 4 to 6 weeks

## 2024-01-21 ENCOUNTER — Telehealth: Payer: Self-pay | Admitting: Family Medicine

## 2024-01-21 ENCOUNTER — Other Ambulatory Visit: Payer: Self-pay

## 2024-01-21 DIAGNOSIS — R1030 Lower abdominal pain, unspecified: Secondary | ICD-10-CM

## 2024-01-21 NOTE — Telephone Encounter (Signed)
 Called and informed pt of her lab orders and recommendations.

## 2024-01-21 NOTE — Telephone Encounter (Signed)
 Nurses I just saw this patient yesterday She did complain of a lot of physical symptoms that I feel is related to stress  Please call the patient let her know that even though I feel that her symptoms are due to stress she has not had blood work in a period of time and it would be reasonable to check lab work to make sure there is not other issues going on-I would recommend CBC, CMP, lipase due to abdominal pain she can get this drawn through [facility] thank you

## 2024-01-24 ENCOUNTER — Encounter: Payer: Self-pay | Admitting: Family Medicine

## 2024-01-24 NOTE — Telephone Encounter (Signed)
 Patient dropped off document FMLA, to be filled out by provider. Patient requested to send it back via Call Patient to pick up within 7-days. Document is located in providers tray at front office.Please advise at Mobile [phone] (mobile)   Placed in his folder up front in yellow folder

## 2024-01-25 ENCOUNTER — Other Ambulatory Visit: Payer: Self-pay

## 2024-01-25 DIAGNOSIS — R1030 Lower abdominal pain, unspecified: Secondary | ICD-10-CM | POA: Diagnosis not present

## 2024-01-25 DIAGNOSIS — F410 Panic disorder [episodic paroxysmal anxiety] without agoraphobia: Secondary | ICD-10-CM

## 2024-01-25 DIAGNOSIS — F411 Generalized anxiety disorder: Secondary | ICD-10-CM

## 2024-01-25 DIAGNOSIS — Z566 Other physical and mental strain related to work: Secondary | ICD-10-CM

## 2024-01-25 NOTE — Telephone Encounter (Signed)
 Nurses Please go ahead with referral for counseling I recommend in [place] with psychology Diagnosis stress, panic attacks  You may forward the following message as well  Hi Jasmin Chen-we will go ahead and progress forward with the referral their office should reach out to you hopefully over the next 10 days to help set up an appointment if you have not heard anything please let me know.  Hope you gradually feel better.  Please give me an update within the next 2 weeks how you are doing thanks-Dr. Glendia

## 2024-01-26 ENCOUNTER — Ambulatory Visit: Payer: Self-pay | Admitting: Family Medicine

## 2024-01-26 LAB — CBC WITH DIFFERENTIAL/PLATELET
Basophils Absolute: 0.1 x10E3/uL (ref 0.0–0.2)
Basos: 1 %
EOS (ABSOLUTE): 0.5 x10E3/uL — ABNORMAL HIGH (ref 0.0–0.4)
Eos: 6 %
Hematocrit: 40.8 % (ref 34.0–46.6)
Hemoglobin: 13.3 g/dL (ref 11.1–15.9)
Immature Grans (Abs): 0.1 x10E3/uL (ref 0.0–0.1)
Immature Granulocytes: 1 %
Lymphocytes Absolute: 2.8 x10E3/uL (ref 0.7–3.1)
Lymphs: 38 %
MCH: 28.4 pg (ref 26.6–33.0)
MCHC: 32.6 g/dL (ref 31.5–35.7)
MCV: 87 fL (ref 79–97)
Monocytes Absolute: 0.4 x10E3/uL (ref 0.1–0.9)
Monocytes: 6 %
Neutrophils Absolute: 3.6 x10E3/uL (ref 1.4–7.0)
Neutrophils: 48 %
Platelets: 240 x10E3/uL (ref 150–450)
RBC: 4.68 x10E6/uL (ref 3.77–5.28)
RDW: 14.3 % (ref 11.7–15.4)
WBC: 7.4 x10E3/uL (ref 3.4–10.8)

## 2024-01-26 LAB — COMPREHENSIVE METABOLIC PANEL WITH GFR
ALT: 11 IU/L (ref 0–32)
AST: 12 IU/L (ref 0–40)
Albumin: 4.1 g/dL (ref 3.9–4.9)
Alkaline Phosphatase: 57 IU/L (ref 44–121)
BUN/Creatinine Ratio: 16 (ref 9–23)
BUN: 11 mg/dL (ref 6–24)
Bilirubin Total: 0.6 mg/dL (ref 0.0–1.2)
CO2: 20 mmol/L (ref 20–29)
Calcium: 9 mg/dL (ref 8.7–10.2)
Chloride: 102 mmol/L (ref 96–106)
Creatinine, Ser: 0.67 mg/dL (ref 0.57–1.00)
Globulin, Total: 2.7 g/dL (ref 1.5–4.5)
Glucose: 110 mg/dL — ABNORMAL HIGH (ref 70–99)
Potassium: 4.6 mmol/L (ref 3.5–5.2)
Sodium: 137 mmol/L (ref 134–144)
Total Protein: 6.8 g/dL (ref 6.0–8.5)
eGFR: 110 mL/min/1.73 (ref >59)

## 2024-01-26 LAB — LIPASE: Lipase: 20 U/L (ref 14–72)

## 2024-02-07 ENCOUNTER — Telehealth: Payer: Self-pay | Admitting: Family Medicine

## 2024-02-07 NOTE — Telephone Encounter (Signed)
 [place] sent over another form for her disability claim to be completed and sent back by 8/29, In a red folder in your box to complete.

## 2024-02-09 NOTE — Telephone Encounter (Signed)
 Form was completed thank you please fax back

## 2024-02-11 NOTE — Telephone Encounter (Signed)
 Form has been faxed back and copy upfront for patient to pick-up

## 2024-02-17 ENCOUNTER — Ambulatory Visit: Admitting: Family Medicine

## 2024-03-16 ENCOUNTER — Ambulatory Visit: Admitting: Family Medicine

## 2024-04-13 DIAGNOSIS — J329 Chronic sinusitis, unspecified: Secondary | ICD-10-CM | POA: Diagnosis not present

## 2024-05-12 ENCOUNTER — Ambulatory Visit: Admitting: Family Medicine

## 2024-05-17 ENCOUNTER — Ambulatory Visit: Admitting: Family Medicine

## 2024-05-23 DIAGNOSIS — M25532 Pain in left wrist: Secondary | ICD-10-CM | POA: Diagnosis not present
